# Patient Record
Sex: Male | Born: 1940 | Race: White | Hispanic: No | Marital: Married | Smoking: Never smoker
Health system: Southern US, Community
[De-identification: ages and names within clinical notes are randomized; demographics above are authoritative.]

## PROBLEM LIST (undated history)

## (undated) DIAGNOSIS — I712 Thoracic aortic aneurysm, without rupture: Secondary | ICD-10-CM

## (undated) DIAGNOSIS — I359 Nonrheumatic aortic valve disorder, unspecified: Secondary | ICD-10-CM

## (undated) DIAGNOSIS — H547 Unspecified visual loss: Secondary | ICD-10-CM

## (undated) DIAGNOSIS — K635 Polyp of colon: Secondary | ICD-10-CM

## (undated) DIAGNOSIS — I10 Essential (primary) hypertension: Secondary | ICD-10-CM

## (undated) DIAGNOSIS — R011 Cardiac murmur, unspecified: Secondary | ICD-10-CM

## (undated) DIAGNOSIS — Z954 Presence of other heart-valve replacement: Secondary | ICD-10-CM

## (undated) DIAGNOSIS — M1990 Unspecified osteoarthritis, unspecified site: Secondary | ICD-10-CM

## (undated) DIAGNOSIS — E785 Hyperlipidemia, unspecified: Secondary | ICD-10-CM

## (undated) HISTORY — DX: Unspecified osteoarthritis, unspecified site: M19.90

## (undated) HISTORY — DX: Hyperlipidemia, unspecified: E78.5

## (undated) HISTORY — DX: Essential (primary) hypertension: I10

## (undated) HISTORY — DX: Thoracic aortic aneurysm, without rupture: I71.2

## (undated) HISTORY — PX: TONSILLECTOMY: SHX5217

## (undated) HISTORY — PX: CARDIAC CATHETERIZATION: SHX172

## (undated) HISTORY — DX: Presence of other heart-valve replacement: Z95.4

## (undated) HISTORY — DX: Polyp of colon: K63.5

---
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
## 1974-04-30 HISTORY — PX: OTHER SURGICAL HISTORY: SHX169

## 1980-04-30 DIAGNOSIS — H547 Unspecified visual loss: Secondary | ICD-10-CM

## 1980-04-30 HISTORY — DX: Unspecified visual loss: H54.7

## 1980-04-30 HISTORY — PX: OTHER SURGICAL HISTORY: SHX169

## 2003-04-01 ENCOUNTER — Encounter: Payer: Self-pay | Admitting: Internal Medicine

## 2003-05-17 ENCOUNTER — Encounter: Payer: Self-pay | Admitting: Internal Medicine

## 2006-01-22 ENCOUNTER — Encounter: Admission: RE | Admit: 2006-01-22 | Discharge: 2006-01-22 | Payer: Self-pay | Admitting: Orthopedic Surgery

## 2009-03-03 ENCOUNTER — Encounter (INDEPENDENT_AMBULATORY_CARE_PROVIDER_SITE_OTHER): Payer: Self-pay | Admitting: *Deleted

## 2009-04-21 DIAGNOSIS — K644 Residual hemorrhoidal skin tags: Secondary | ICD-10-CM | POA: Insufficient documentation

## 2009-04-21 DIAGNOSIS — I10 Essential (primary) hypertension: Secondary | ICD-10-CM

## 2009-04-21 DIAGNOSIS — M109 Gout, unspecified: Secondary | ICD-10-CM | POA: Insufficient documentation

## 2009-04-21 DIAGNOSIS — Q230 Congenital stenosis of aortic valve: Secondary | ICD-10-CM

## 2009-04-21 DIAGNOSIS — E785 Hyperlipidemia, unspecified: Secondary | ICD-10-CM

## 2009-05-02 ENCOUNTER — Ambulatory Visit: Payer: Self-pay | Admitting: Internal Medicine

## 2009-06-03 ENCOUNTER — Telehealth: Payer: Self-pay | Admitting: Internal Medicine

## 2009-06-07 ENCOUNTER — Ambulatory Visit: Payer: Self-pay | Admitting: Internal Medicine

## 2009-06-08 ENCOUNTER — Telehealth: Payer: Self-pay | Admitting: Internal Medicine

## 2009-06-08 ENCOUNTER — Encounter: Payer: Self-pay | Admitting: Internal Medicine

## 2010-05-30 NOTE — Progress Notes (Signed)
Summary: ? re col report  Phone Note Call from Patient Call back at [phone]   Caller: Patient Call For: Juanda Chance Summary of Call: Patient has questions regarding report given to him after procedure yesterday Initial call taken by: Tawni Levy,  June 08, 2009 4:47 PM  Follow-up for Phone Call        Pt questioned what a sessile polyp is; all questions answered and pt told he would receive a letter from Dr. Juanda Chance as to what pathology showed.  No further questions Follow-up by: Karl Bales RN,  June 08, 2009 4:55 PM

## 2010-05-30 NOTE — Letter (Signed)
Summary: Patient Notice- Polyp Results  [facility]  [address] [place], [place] [address]   Phone: [phone]  Fax: [phone]        June 08, 2009 MRN: [id number]    [facility] [address] [place], [place]  [address]    Dear Mr. [facility],  I am pleased to inform you that the colon polyp(s) removed during your recent colonoscopy was (were) found to be benign (no cancer detected) upon pathologic examination.The polyp was an adenoma ( precancerous polyp)  I recommend you have a repeat colonoscopy examination in 5_ years to look for recurrent polyps, as having colon polyps increases your risk for having recurrent polyps or even colon cancer in the future.  Should you develop new or worsening symptoms of abdominal pain, bowel habit changes or bleeding from the rectum or bowels, please schedule an evaluation with either your primary care physician or with me.  Additional information/recommendations:  _x_ No further action with gastroenterology is needed at this time. Please      follow-up with your primary care physician for your other healthcare      needs.  __ Please call [phone] to schedule a return visit to review your      situation.  __ Please keep your follow-up visit as already scheduled.  __ Continue treatment plan as outlined the day of your exam.  Please call us if you are having persistent problems or have questions about your condition that have not been fully answered at this time.  Sincerely,  Hart Carwin MD  This letter has been electronically signed by your physician.  Appended Document: Patient Notice- Polyp Results letter mailed 2.11.11

## 2010-05-30 NOTE — Procedures (Signed)
Summary: Colonoscopy  Patient: [name] Note: All result statuses are Final unless otherwise noted.  Tests: (1) Colonoscopy (COL)   COL Colonoscopy           DONE     [facility]     [address]     [place], [place]  [address]           COLONOSCOPY PROCEDURE REPORT           PATIENT:  Dakota Martin, Dakota Martin  MR#:  [id number]     BIRTHDATE:  07-28-1940, 68 yrs. old  GENDER:  male           ENDOSCOPIST:  Hedwig Morton. Juanda Chance, MD     Referred by:  Rodrigo Ran, M.D.           PROCEDURE DATE:  06/07/2009     PROCEDURE:  Colonoscopy 63[address]     ASA CLASS:  Class II     INDICATIONS:  colon cancer in father     hx of colon polyp     pt on Coumadin           MEDICATIONS:   Versed 8 mg, Fentanyl 50 mcg           DESCRIPTION OF PROCEDURE:   After the risks benefits and     alternatives of the procedure were thoroughly explained, informed     consent was obtained.  Digital rectal exam was performed and     revealed no rectal masses.   The LB CF-[id number] endoscope     was introduced through the anus and advanced to the cecum, which     was identified by both the appendix and ileocecal valve, without     limitations.  The quality of the prep was good, using MiraLax.     The instrument was then slowly withdrawn as the colon was fully     examined.     <<PROCEDUREIMAGES>>           FINDINGS:  A sessile polyp was found. 3 mm sessile polyp at 25 cm     removed with hot biopsies piecemeal With hot biopsy forceps,     biopsy was obtained and sent to pathology (see image3 and image4).     Internal hemorrhoids were found (see image5 and image6).  This was     otherwise a normal examination of the colon (see image1 and     image2).   Retroflexed views in the rectum revealed no     abnormalities.    The scope was then withdrawn from the patient     and the procedure completed.           COMPLICATIONS:  None           ENDOSCOPIC IMPRESSION:     1) Sessile polyp     2) Internal  hemorrhoids     3) Otherwise normal examination     s/p polypectomy     RECOMMENDATIONS:     1) Await pathology results     continue Coumadin           REPEAT EXAM:  In 5 year(s) for.           ______________________________     Hedwig Morton. Juanda Chance, MD           CC:           n.     eSIGNED:   Hedwig Morton. Jmarion Christiano at 06/07/2009 08:37 AM  [name], [name], [id number]  Note: An exclamation mark (!) indicates a result that was not dispersed into the flowsheet. Document Creation Date: 06/07/2009 8:37 AM _______________________________________________________________________  (1) Order result status: Final Collection or observation date-time: 06/07/2009 08:29 Requested date-time:  Receipt date-time:  Reported date-time:  Referring Physician:   Ordering Physician: Lina Sar [phone]) Specimen Source:  Source: Launa Grill Order Number: [id number] Lab site:   Appended Document: Colonoscopy     Procedures Next Due Date:    Colonoscopy: 05/2014

## 2010-05-30 NOTE — Progress Notes (Signed)
Summary: ? re procedure  Phone Note Call from Patient Call back at [phone]   Caller: Patient Call For: Juanda Chance Reason for Call: Talk to Nurse Summary of Call: Patient has questions regarding procedure before col on Tues. Initial call taken by: Tawni Levy,  June 03, 2009 11:19 AM  Follow-up for Phone Call        Patient's questions have  been answered. Follow-up by: Hortense Ramal CMA Duncan Dull),  June 03, 2009 11:35 AM

## 2010-05-30 NOTE — Letter (Signed)
Summary: [facility] Instructions  [facility]  [address] [place], [place] [address]   Phone: [phone]  Fax: [phone]       Dakota Martin    [birth year]    MRN: [id number]       Procedure Day /Date: 06/07/09 (Tuesday)     Arrival Time: 7:30 am     Procedure Time: 8:00 am     Location of Procedure:                    _ x_  [facility] (4th Floor)  PREPARATION FOR COLONOSCOPY WITH MIRALAX  Starting 5 days prior to your procedure 05/31/09 do not eat nuts, seeds, popcorn, corn, beans, peas,  salads, or any raw vegetables.  Do not take any fiber supplements (e.g. Metamucil, Citrucel, and Benefiber). ____________________________________________________________________________________________________   THE DAY BEFORE YOUR PROCEDURE         DATE: 06/06/09 DAY: Monday 1   Drink clear liquids the entire day-NO SOLID FOOD  2   Do not drink anything colored red or purple.  Avoid juices with pulp.  No orange juice.  3   Drink at least 64 oz. (8 glasses) of fluid/clear liquids during the day to prevent dehydration and help the prep work efficiently.  CLEAR LIQUIDS INCLUDE: Water Jello Ice Popsicles Tea (sugar ok, no milk/cream) Powdered fruit flavored drinks Coffee (sugar ok, no milk/cream) Gatorade Juice: apple, white grape, white cranberry  Lemonade Clear bullion, consomm, broth Carbonated beverages (any kind) Strained chicken noodle soup Hard Candy  4   Mix the entire bottle of Miralax with 64 oz. of Gatorade/Powerade in the morning and put in the refrigerator to chill.  5   At 3:00 pm take 2 Dulcolax/Bisacodyl tablets.  6   At 4:30 pm take one Reglan/Metoclopramide tablet.  7  Starting at 5:00 pm drink one 8 oz glass of the Miralax mixture every 15-20 minutes until you have finished drinking the entire 64 oz.  You should finish drinking prep around 7:30 or 8:00 pm.  8   If you are nauseated, you may take the 2nd Reglan/Metoclopramide tablet at  6:30 pm.        9    At 8:00 pm take 2 more DULCOLAX/Bisacodyl tablets.         THE DAY OF YOUR PROCEDURE      DATE:  06/07/09 DAY: Tuesday  You may drink clear liquids until 6:00 am  (2 HOURS BEFORE PROCEDURE).   MEDICATION INSTRUCTIONS  Unless otherwise instructed, you should take regular prescription medications with a small sip of water as early as possible the morning of your procedure.          Continue taking Coumadin per Dr Lina Sar         OTHER INSTRUCTIONS  You will need a responsible adult at least 69 years of age to accompany you and drive you home.   This person must remain in the waiting room during your procedure.  Wear loose fitting clothing that is easily removed.  Leave jewelry and other valuables at home.  However, you may wish to bring a book to read or an iPod/MP3 player to listen to music as you wait for your procedure to start.  Remove all body piercing jewelry and leave at home.  Total time from sign-in until discharge is approximately 2-3 hours.  You should go home directly after your procedure and rest.  You can resume normal activities the day after  your procedure.  The day of your procedure you should not:   Drive   Make legal decisions   Operate machinery   Drink alcohol   Return to work  You will receive specific instructions about eating, activities and medications before you leave.   The above instructions have been reviewed and explained to me by   Hortense Ramal, CMA    I fully understand and can verbalize these instructions _____________________________ Date 05/02/09

## 2010-05-30 NOTE — Assessment & Plan Note (Signed)
Summary: REC COL/SCRNING/COUMADIN...AS.   History of Present Illness Visit Type: Initial Visit Primary GI MD: Lina Sar MD Primary Provider: Rodrigo Ran, MD Chief Complaint: Patient here for consult for colonoscopy due to being on coumadin.  History of Present Illness:   This is a 70 year old white male who is here to discuss having a recall colonoscopy. He has a family history of colon cancer in his mother as well as a history of colon polyps in his brother. His last colonoscopy in January 2005 was negative. 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 He prefers to have his colonoscopy done again on Coumadin. Patient denies any lower GI symptoms, rectal bleeding or abdominal pain.   GI Review of Systems      Denies abdominal pain, acid reflux, belching, bloating, chest pain, dysphagia with liquids, dysphagia with solids, heartburn, loss of appetite, nausea, vomiting, vomiting blood, weight loss, and  weight gain.        Denies anal fissure, black tarry stools, change in bowel habit, constipation, diarrhea, diverticulosis, fecal incontinence, heme positive stool, hemorrhoids, irritable bowel syndrome, jaundice, light color stool, liver problems, rectal bleeding, and  rectal pain.    Current Medications (verified): 1)  Coumadin 5 Mg Tabs (Warfarin Sodium) .... Take One A Day By Mouth, Except On Mon, Wed, Fri, and Sat 2)  Lipitor 10 Mg Tabs (Atorvastatin Calcium) .... Take One By Mouth Once Daily 3)  Zetia 10 Mg Tabs (Ezetimibe) .... Take One By Mouth Once Daily 4)  Benicar 40 Mg Tabs (Olmesartan Medoxomil) .... Take One By Mouth Once Daily 5)  Amlodipine Besylate 5 Mg Tabs (Amlodipine Besylate) .... Take One By Mouth Once Daily 6)  Allopurinol 300 Mg Tabs (Allopurinol) .... Take One By Mouth Once Daily 7)  Colchicine 0.6 Mg Tabs (Colchicine) .... 2-3 Per  Day As Needed For Gout Attacks. 8)  Flonase 50 Mcg/act Susp (Fluticasone Propionate) .... One Spray, One Nostril As Needed 9)  Multivitamins  Tabs (Multiple Vitamin) .... Take Four  By Mouth Once Daily 10)  Fish Oil 300 Mg Caps (Omega-3 Fatty Acids) .... Take Two By Mouth Once Daily 11)  Coenzyme Q10 50 Mg Caps (Coenzyme Q10) .... Take One By Mouth Once Daily 12)  Glucosamine Sulfate 750 Mg Caps (Glucosamine Sulfate) .... Take Two By Mouth Once Daily 13)  Kls Allerclear D-24hr 10-240 Mg Xr24h-Tab (Loratadine-Pseudoephedrine) .... Take One By Mouth Once Daily As Needed 14)  Vitamin D 1000 Unit Tabs (Cholecalciferol) .... Take One By Mouth Once Daily  Allergies (verified): No Known Drug Allergies  Past History:  Past Medical History: Reviewed history from 04/21/2009 and no changes required. Current Problems:  Family Hx of COLON CANCER (ICD-153.9) AORTIC STENOSIS, CONGENITAL (ICD-746.3) HYPERTENSION (ICD-401.9) HYPERLIPIDEMIA (ICD-272.4) GOUT, UNSPECIFIED (ICD-274.9) EXTERNAL HEMORRHOIDS (ICD-455.3)    Past Surgical History: Aortic Valve Replacement-Bjork-Shiley valve T & A Vasectomy  Family History: Family History of Colon Cancer: Mother-diagnosed in her 22's Family History of Colon Polyps: Brother Family History of Diabetes: maternal grandfather  Social History: Alcohol Use - yes 2 per day Illicit Drug Use - no Daily Caffeine Use 3 per day Occupation: retired married with two girls  Review of Systems       The patient complains of allergy/sinus.  The patient denies anemia, anxiety-new, arthritis/joint pain, back pain, blood in urine, breast changes/lumps, change in vision, confusion, cough, coughing up blood, depression-new,  fainting, fatigue, fever, headaches-new, hearing problems, heart murmur, heart rhythm changes, itching, menstrual pain, muscle pains/cramps, night sweats, nosebleeds, pregnancy symptoms, shortness of breath, skin rash, sleeping problems, sore throat,  swelling of feet/legs, swollen lymph glands, thirst - excessive , urination - excessive , urination changes/pain, urine leakage, vision changes, and voice change.         Pertinent positive and negative review of systems were noted in the above HPI. All other ROS was otherwise negative.   Vital Signs:  Patient profile:   70 year old male Height:      70 inches Weight:      181.8 pounds BMI:     26.18 Pulse rate:   88 / minute Pulse rhythm:   regular BP sitting:   144 / 60  (left arm) Cuff size:   regular  Vitals Entered By: Harlow Mares CMA Duncan Dull) (May 02, 2009 3:13 PM)  Physical Exam  General:  Well developed, well nourished, no acute distress. Eyes:  PERRLA, no icterus. Neck:  Supple; no masses or thyromegaly. Lungs:  Clear throughout to auscultation. Heart:  soft 1/6 systolic ejection murmur barely audible in carotids, regular rhythm, precordium quiet. Abdomen:  Soft, nontender and nondistended. No masses, hepatosplenomegaly or hernias noted. Normal bowel sounds. Extremities:  No clubbing, cyanosis, edema or deformities noted.   Impression & Recommendations:  Problem # 1:  Family Hx of COLON CANCER (ICD-153.9)  Patient is due for a colonoscopy. His last one was in January 2005. He is currently asymptomatic. We will schedule him with a MiraLax prep on Coumadin.  Orders: Colonoscopy (Colon)  Problem # 2:  AORTIC STENOSIS, CONGENITAL (ICD-746.3) Patient takes prophylaxis for dental procedures but according to the new AGA guidelines, no antibiotics are necessary before his colonoscopy.  Problem # 3:  EXTERNAL HEMORRHOIDS (ICD-455.3)  asymptomatic.  Orders: Colonoscopy (Colon)  Patient Instructions: 1)  schedule colonoscopy with MiraLax prep. 2)  High-fiber diet. 3)  Continue Coumadin throughout the procedure. 4)  No SBE prophylaxis according to AGA guidelines. 5)  Copy sent to :Dr Rodrigo Ran Prescriptions: DULCOLAX 5 MG  TBEC (BISACODYL) Day before procedure  take 2 at 3pm and 2 at 8pm.  #4 x 0   Entered by:   Hortense Ramal CMA (AAMA)   Authorized by:   Hart Carwin MD   Signed by:   Hortense Ramal CMA (AAMA) on 05/02/2009   Method used:   Electronically to        [id number] (retail)       [address]       [place], [place]  [address]       Ph: [phone] or [phone]       Fax: [phone]   RxID:   [id number] REGLAN 10 MG  TABS (METOCLOPRAMIDE HCL) As per prep instructions.  #2 x 0   Entered by:   Hortense Ramal CMA (AAMA)   Authorized by:   Hart Carwin MD   Signed by:   Hortense Ramal CMA (AAMA) on 05/02/2009   Method used:   Electronically to        [id number] (retail)       [address]       [place], [place]  [address]       Ph: [phone] or [phone]       Fax: [phone]   RxID:   [id number] MIRALAX   POWD (POLYETHYLENE GLYCOL 3350) As per prep  instructions.  #255gm x 0  Entered by:   Hortense Ramal CMA (AAMA)   Authorized by:   Hart Carwin MD   Signed by:   Hortense Ramal CMA (AAMA) on 05/02/2009   Method used:   Electronically to        [id number] (retail)       [address]       [place], [place]  [address]       Ph: [phone] or [phone]       Fax: [phone]   RxID:   [id number]

## 2010-06-27 ENCOUNTER — Ambulatory Visit (INDEPENDENT_AMBULATORY_CARE_PROVIDER_SITE_OTHER): Payer: Medicare Other | Admitting: Cardiovascular Disease

## 2010-06-27 DIAGNOSIS — I359 Nonrheumatic aortic valve disorder, unspecified: Secondary | ICD-10-CM

## 2012-01-08 ENCOUNTER — Encounter: Payer: Self-pay | Admitting: Cardiovascular Disease

## 2013-01-15 ENCOUNTER — Other Ambulatory Visit: Payer: Self-pay | Admitting: Dermatology

## 2013-07-23 ENCOUNTER — Encounter: Payer: Self-pay | Admitting: Sports Medicine

## 2013-07-23 ENCOUNTER — Ambulatory Visit (INDEPENDENT_AMBULATORY_CARE_PROVIDER_SITE_OTHER): Payer: Medicare Other | Admitting: Sports Medicine

## 2013-07-23 ENCOUNTER — Ambulatory Visit
Admission: RE | Admit: 2013-07-23 | Discharge: 2013-07-23 | Disposition: A | Discharge status: Home / Self Care | Payer: Medicare Other | Source: Ambulatory Visit | Attending: Sports Medicine | Admitting: Sports Medicine

## 2013-07-23 VITALS — BP 138/70 | Ht 69.0 in | Wt 174.0 lb

## 2013-07-23 DIAGNOSIS — M25511 Pain in right shoulder: Secondary | ICD-10-CM

## 2013-07-23 DIAGNOSIS — M25519 Pain in unspecified shoulder: Secondary | ICD-10-CM

## 2013-07-23 DIAGNOSIS — M19019 Primary osteoarthritis, unspecified shoulder: Secondary | ICD-10-CM

## 2013-07-23 NOTE — Progress Notes (Signed)
   Subjective:    Patient ID: Dakota Martin, male    DOB: [birth year], 73 y.o.   MRN: [id number]  HPI chief complaint: Right shoulder pain and weakness  Very pleasant right-hand-dominant 73 year old male comes in today complaining of 2 weeks of right shoulder pain and weakness. He has had trouble with this same shoulder in the past. 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 Rotator cuff tendon was intact. Symptoms eventually improved. He is very active particularly with tennis. He began to notice some discomfort while playing tennis a couple of weeks ago. His pain is diffuse in the shoulder. It is present with abduction and external rotation as well as internal rotation. He has been working with a local physical therapist and at his appointment yesterday concern was raised about weakness with external rotation of this shoulder. His therapist recommended an appointment with me. No prior shoulder surgeries. No associated numbness or tingling. No neck pain. Pain does occasionally awaken him at night. He takes ibuprofen on a when necessary basis but is limited with the amount of NSAIDs he can take due to the fact that he is on Coumadin. He denies any locking or catching in the shoulder but does get occasional popping.  Current medications include warfarin(heart valve replacement), Lipitor, Zetia, allopurinol, colchicine, amlodipine, Benicar, and multivitamins No known drug allergies    Review of Systems As above     Objective:   Physical Exam Well-developed, well-nourished. No acute distress. Awake alert and oriented x3. Vital signs are reviewed.  Right shoulder: Active forward flexion is to about 140. Active abduction is to 100. Internal rotation is 80. External rotation is 70. No tenderness to palpation along the clavicle nor over the a.c. joint. No tenderness over the bicipital  groove. No atrophy. Negative empty can, negative Hawkins. Patient has excellent strength with resisted supraspinatus but has 3-4/5 strength with resisted external rotation along with pain. There is palpable crepitus with passive internal and external rotation. Negative Spurling's. Neurovascularly intact distally.  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 Patient will followup with me early next week to review those x-rays. I will likely do an ultrasound of his right shoulder at followup. However, even if he has a rotator cuff tear, the bigger problem here is the glenohumeral DJD. We will discuss treatment including cortisone injections at his followup visit as well. He will hold on physical therapy until he sees me again next week.  Addendum: I personally reviewed the x-rays of his right shoulder. 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 I will discuss these results with the patient at followup.

## 2013-07-28 ENCOUNTER — Encounter: Payer: Self-pay | Admitting: Sports Medicine

## 2013-07-28 ENCOUNTER — Encounter (INDEPENDENT_AMBULATORY_CARE_PROVIDER_SITE_OTHER): Payer: Self-pay

## 2013-07-28 ENCOUNTER — Ambulatory Visit (INDEPENDENT_AMBULATORY_CARE_PROVIDER_SITE_OTHER): Payer: Medicare Other | Admitting: Sports Medicine

## 2013-07-28 VITALS — BP 167/82 | HR 76 | Ht 69.0 in | Wt 174.0 lb

## 2013-07-28 DIAGNOSIS — M19019 Primary osteoarthritis, unspecified shoulder: Secondary | ICD-10-CM | POA: Insufficient documentation

## 2013-07-28 NOTE — Progress Notes (Signed)
   Subjective:    Patient ID: Dakota Martin, male    DOB: Jun 06, 1940, 73 y.o.   MRN: [id number]  HPI Patient comes in today to discuss x-ray findings of his right shoulder. 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 He continues to have diffuse pain in the right shoulder but also noticeable weakness with resisted external rotation. No numbness. He was working with physical therapy up until recently. I asked him to put therapy on hold until I saw him today.    Review of Systems     Objective:   Physical Exam Well-developed, well-nourished. No acute distress  Right shoulder: No noticeable atrophy. Limited range of motion in all planes secondary to pain. 2-3/5 strength with resisted external rotation. Supraspinatus strength is 4/5 and reproducible of pain. No tenderness in the bicipital groove. Neurovascularly intact distally.  [facility] ultrasound of the right shoulder: Limited images of the right shoulder were obtained. There are hypoechoic changes in the distal supraspinatus tendon on the articular side. These changes are seen both in the long and short view and are consistent with probable partial supraspinatus tearing. Subscapularis had a few scattered hypoechoic changes but no discrete tear. Infraspinatus tendon appears to be within normal limits.  X-rays are as above       Assessment & Plan:  1. 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 Right shoulder weakness, especially with external rotation  Although the patient does have ultrasound findings consistent with a partial supraspinatus tendon tear, the bigger problem is the underlying bone-on-bone glenohumeral DJD. I'm not convinced that either one of these however explains his weakness with external rotation. He has pure weakness and not weakness secondary to pain. Weakness has been present for about 3 weeks. It is possible that he could have a  paralabral cyst in either the suprascapular notch or spinoglenoid notch. I discussed the patient's treatment options including cortisone injection, resuming physical therapy, or referral to one of the shoulder specialists. Patient has had cortisone injections in the past without much improvement. Therefore, I will have him resume physical therapy and followup with me in 3 weeks. If weakness persists then I may encourage him to see one of the shoulder specialists for their input. I've encouraged him to call me with questions or concerns in the interim.

## 2013-08-17 ENCOUNTER — Ambulatory Visit: Payer: Medicare Other | Admitting: Sports Medicine

## 2013-08-19 ENCOUNTER — Ambulatory Visit: Payer: Medicare Other | Admitting: Sports Medicine

## 2013-09-16 ENCOUNTER — Ambulatory Visit (INDEPENDENT_AMBULATORY_CARE_PROVIDER_SITE_OTHER): Payer: Medicare Other | Admitting: Sports Medicine

## 2013-09-16 ENCOUNTER — Encounter: Payer: Self-pay | Admitting: Sports Medicine

## 2013-09-16 VITALS — BP 127/75 | Ht 69.0 in | Wt 170.0 lb

## 2013-09-16 DIAGNOSIS — M19019 Primary osteoarthritis, unspecified shoulder: Secondary | ICD-10-CM

## 2013-09-16 NOTE — Progress Notes (Signed)
   Subjective:    Patient ID: Dakota Martin, male    DOB: 07-19-1940, 73 y.o.   MRN: [id number]  HPI Patient comes in today for followup on right shoulder weakness. Overall his pain and weakness had improved. He continues to work with Kym Groom. He is going every other week but he is doing his home exercises daily. As a result, he has noticed improved strength with external rotation of the shoulder. Still with some occasional pain but he has some underlying osteoarthritis. Overall, he is pleased with his progress to date.    Review of Systems     Objective:   Physical Exam Well-developed, well-nourished. No acute distress. Awake alert and oriented x3. Vital signs reviewed.  Right shoulder: There may be a mild amount of atrophy in the supraspinatus and infraspinatus fossas. He has a fairly good range of motion today. It is fairly pain-free range of motion. 4/5 strength with resisted external rotation which is a great improvement from his office visit on March 31. Still with excellent strength with resisted internal rotation and supraspinatus. No pain today with resisted supraspinatus. No pain with empty can testing. He remains neurovascular intact distally.       Assessment & Plan:  1. Improved shoulder pain secondary to glenohumeral DJD concomitant partial supraspinatus tear (seen on ultrasound at last visit) 2. Improved right shoulder weakness (external rotation)  My main concern initially was the patient's weakness with external rotation. That has improved quite a bit over the past several weeks. His pain is tolerable and I think it is a result of his end-stage glenohumeral DJD and partial thickness rotator cuff tear seen on ultrasound. Since he is improving I will allow him to continue to work with physical therapy. If pain worsens in the future we could consider cortisone injection. I've explained to the patient that if he starts to experience worsening weakening again then  he needs to notify me and I would consider an MRI of his shoulder to rule out a para labral cyst in the suprascapular or sphenoglenoid notches. He understands. Followup when necessary.

## 2013-12-08 ENCOUNTER — Other Ambulatory Visit (HOSPITAL_COMMUNITY): Payer: Medicare Other

## 2014-01-07 ENCOUNTER — Ambulatory Visit (INDEPENDENT_AMBULATORY_CARE_PROVIDER_SITE_OTHER): Payer: Medicare Other | Admitting: Cardiovascular Disease

## 2014-01-07 ENCOUNTER — Encounter: Payer: Self-pay | Admitting: Cardiovascular Disease

## 2014-01-07 VITALS — BP 126/66 | HR 84 | Ht 69.0 in | Wt 178.0 lb

## 2014-01-07 DIAGNOSIS — I10 Essential (primary) hypertension: Secondary | ICD-10-CM

## 2014-01-07 DIAGNOSIS — Z954 Presence of other heart-valve replacement: Secondary | ICD-10-CM

## 2014-01-07 DIAGNOSIS — Z952 Presence of prosthetic heart valve: Secondary | ICD-10-CM

## 2014-01-07 NOTE — Assessment & Plan Note (Signed)
Dakota Martin is status post Aortic valve replacement. He had a Bjork Shiley  mechanical aortic valve placed in 1982 by Dr. Truman Hayward. He's been doing fairly well. His Coumadin levels have been followed by Scientist, product/process development. His INR levels have been therapeutic.  Continue same medications. We'll see him back in the office in one year.

## 2014-01-07 NOTE — Patient Instructions (Signed)
Your physician recommends that you continue on your current medications as directed. Please refer to the Current Medication list given to you today.  Your physician has requested that you have an echocardiogram. Echocardiography is a painless test that uses sound waves to create images of your heart. It provides your doctor with information about the size and shape of your heart and how well your heart's chambers and valves are working. This procedure takes approximately one hour. There are no restrictions for this procedure.  Your physician wants you to follow-up in: 1 year with Dr. Nahser.  You will receive a reminder letter in the mail two months in advance. If you don't receive a letter, please call our office to schedule the follow-up appointment.  

## 2014-01-07 NOTE — Progress Notes (Signed)
Dakota Martin Date of Birth  1940-06-24       [facility]    [employer] [address] [place] [address], [place], [place]  [address]   [place], [place]  [address] [place]   Fax  [phone]     Fax [phone]  Problem List: 1. Aortic valve replacement ( June 1982, Onnie Boer, MD)  [facility] mechanical valve. 2. Chronic coumadin therapy.  3. Hypertension 4. Hyperlipidemia   History of Present Illness:  Dakota Martin is a 73 yo with hx of AVR in 1982.   He has been doing well.  No CP , no dyspnea.   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    Current Outpatient Prescriptions on File Prior to Visit  Medication Sig Dispense Refill  . allopurinol (ZYLOPRIM) 300 MG tablet Take 300 mg by mouth daily.      [name] Kitchen amLODipine (NORVASC) 5 MG tablet Take 5 mg by mouth daily.      [name] Kitchen amoxicillin (AMOXIL) 500 MG capsule Take 500 mg by mouth as needed. For dental visits      . atorvastatin (LIPITOR) 10 MG tablet Take 10 mg by mouth daily.      . Cholecalciferol (VITAMIN D-3 PO) Take by mouth.      . Coenzyme Q10 (CO Q 10 PO) Take by mouth.      . ezetimibe (ZETIA) 10 MG tablet Take 10 mg by mouth daily.      . fish oil-omega-3 fatty acids 1000 MG capsule Take 2 g by mouth daily.      . Fluticasone Propionate (FLONASE NA) Place into the nose.      [name] Kitchen glucosamine-chondroitin 500-400 MG tablet Take 1 tablet by mouth 3 (three) times daily.      [name] Kitchen warfarin (COUMADIN) 5 MG tablet Take 5 mg by mouth daily.       No current facility-administered medications on file prior to visit.    No Known Allergies  Past Medical History  Diagnosis Date  . Hypertension   . Hyperlipidemia   . Gout     Past Surgical History  Procedure Laterality Date  . Aortic valve  1982    prosthetic  val ve  . Tonsillectomy      History  Smoking status  . Never Smoker     Smokeless tobacco  . Not on file    History  Alcohol Use: Not on file    No family history on file.  Reviw of Systems:  Reviewed in the HPI.  All other systems are negative.  Physical Exam: Blood pressure 126/66, pulse 84, height 5\' 9"  (1.753 m), weight 178 lb (80.74 kg). Wt Readings from Last 3 Encounters:  01/07/14 178 lb (80.74 kg)  09/16/13 170 lb (77.111 kg)  07/28/13 174 lb (78.926 kg)     General: Well developed, well nourished, in no acute distress.  Head: Normocephalic, atraumatic, sclera non-icteric, mucus membranes are moist,   Neck: Supple. Carotids are 2 + without bruits. No JVD   Lungs: Clear   Heart: RR, normal S1, mechanical S2  Abdomen: Soft, non-tender, non-distended with normal bowel sounds.  Msk:  Strength and tone are normal   Extremities: No clubbing or cyanosis. No edema.  Distal pedal pulses are 2+ and equal    Neuro: CN II -  XII intact.  Alert and oriented X 3.   Psych:  Normal   ECG: 01/07/2014: Normal sinus rhythm at 84. There are no ST or T wave changes. EKG is normal.  Assessment / Plan:

## 2014-01-13 ENCOUNTER — Ambulatory Visit (HOSPITAL_COMMUNITY): Payer: Medicare Other | Attending: Cardiovascular Disease | Admitting: Cardiology

## 2014-01-13 DIAGNOSIS — E785 Hyperlipidemia, unspecified: Secondary | ICD-10-CM | POA: Diagnosis not present

## 2014-01-13 DIAGNOSIS — Z952 Presence of prosthetic heart valve: Secondary | ICD-10-CM

## 2014-01-13 DIAGNOSIS — I10 Essential (primary) hypertension: Secondary | ICD-10-CM | POA: Insufficient documentation

## 2014-01-13 DIAGNOSIS — Q230 Congenital stenosis of aortic valve: Secondary | ICD-10-CM

## 2014-01-13 DIAGNOSIS — Z954 Presence of other heart-valve replacement: Secondary | ICD-10-CM | POA: Diagnosis present

## 2014-01-13 DIAGNOSIS — I359 Nonrheumatic aortic valve disorder, unspecified: Secondary | ICD-10-CM

## 2014-01-13 NOTE — Progress Notes (Signed)
Echo performed. 

## 2014-02-17 ENCOUNTER — Other Ambulatory Visit: Payer: Self-pay | Admitting: Dermatology

## 2014-02-23 ENCOUNTER — Encounter: Payer: Self-pay | Admitting: Cardiovascular Disease

## 2014-02-23 ENCOUNTER — Ambulatory Visit (INDEPENDENT_AMBULATORY_CARE_PROVIDER_SITE_OTHER): Payer: Medicare Other | Admitting: Cardiovascular Disease

## 2014-02-23 VITALS — BP 160/72 | HR 81 | Ht 69.0 in | Wt 178.0 lb

## 2014-02-23 DIAGNOSIS — I10 Essential (primary) hypertension: Secondary | ICD-10-CM

## 2014-02-23 DIAGNOSIS — Z954 Presence of other heart-valve replacement: Secondary | ICD-10-CM

## 2014-02-23 HISTORY — DX: Presence of other heart-valve replacement: Z95.4

## 2014-02-23 MED ORDER — CARVEDILOL 6.25 MG PO TABS: 6.2500 mg | ORAL_TABLET | Freq: Two times a day (BID) | ORAL | Status: DC

## 2014-02-23 NOTE — Assessment & Plan Note (Signed)
Dakota Martin is a 73 yo with history of [place]  aortic valve replacement in 1982 by Dr. Truman Hayward.  I saw him back 4 years ago. At that time his echocardiogram looked OK.  He was not seen until last year. We've repeated his echocardiogram last month it was found have moderate dilatation of his descending aorta. Aortic root measured 50 mm. He is aortic valve appeared to be fairly normal. He had insignificant regurgitation of the mitral and tricuspid valves.  He is Completely asymptomatic.   he plays tennis on a regular basis. I am a little concerned about his aortic dilatation. In addition, his blood pressure is a little elevated.  We'll start him on carvedilol 6.25 mg twice a day. We will continue to record his blood pressure readings. We will forward this note to Dr. Roxan Hockey for further evaluation.  At some point he may need to be followed by Dr. Roxan Hockey on a regular basis.  I will see him in  3 months for followup visit.

## 2014-02-23 NOTE — Assessment & Plan Note (Signed)
His BP is a bit elevated.  We have added coreg 6.25 bid to his med list.

## 2014-02-23 NOTE — Patient Instructions (Signed)
Your physician has recommended you make the following change in your medication:  START Carvedilol (Coreg) 6.25 mg twice daily - (break these pills in half for the first week to titrate onto the medication)  Your physician recommends that you schedule a follow-up appointment in: 3 months with Dr. Acie Fredrickson

## 2014-02-23 NOTE — Progress Notes (Signed)
Dakota Martin Date of Birth  Feb 22, 1941       [facility]    [employer] [address] [place], [place], [place]  [address]   [place], [place]  [address] [place]   Fax  [phone]     Fax [phone]  Problem List: 1. Aortic valve replacement ( June 1982, Onnie Boer, MD)  [facility] mechanical valve. 2. Chronic coumadin therapy.  3. Hypertension 4. Hyperlipidemia 5. 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   He has been doing well.  No CP , no dyspnea.   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    Oct. 27, 2015:  Dakota Martin  is seen back for a scheduled visit. He had a recent echo that revealed a dilated aortic root ( 50 cm)   Current Outpatient Prescriptions on File Prior to Visit  Medication Sig Dispense Refill  . allopurinol (ZYLOPRIM) 300 MG tablet Take 300 mg by mouth daily.      [name] Kitchen amLODipine (NORVASC) 5 MG tablet Take 5 mg by mouth daily.      [name] Kitchen amoxicillin (AMOXIL) 500 MG capsule Take 500 mg by mouth as needed. For dental visits      . atorvastatin (LIPITOR) 10 MG tablet Take 10 mg by mouth daily.      . Chlorpheniramine-Pseudoeph (ALLEREST MAXIMUM STRENGTH PO) Take by mouth daily.      . Cholecalciferol (VITAMIN D-3 PO) Take by mouth.      . Coenzyme Q10 (CO Q 10 PO) Take by mouth.      . colchicine 0.6 MG tablet Take 0.6 mg by mouth daily.      [name] Kitchen ezetimibe (ZETIA) 10 MG tablet Take 10 mg by mouth daily.      . fish oil-omega-3 fatty acids 1000 MG capsule Take 2 g by mouth daily.      . Fluticasone Propionate (FLONASE NA) Place into the nose.      [name] Kitchen glucosamine-chondroitin 500-400 MG tablet Take 1 tablet by mouth 3 (three) times daily.      . Magnesium 500 MG CAPS Take 500 mg by mouth daily.      . Multiple Vitamin (MULTIVITAMIN)  capsule Take 1 capsule by mouth daily.      [name] Kitchen OVER THE COUNTER MEDICATION AYER-TRIPHALA      . warfarin (COUMADIN) 5 MG tablet Take 5 mg by mouth daily.       No current facility-administered medications on file prior to visit.   Benicar 40 mg a day   No Known Allergies  Past Medical History  Diagnosis Date  . Hypertension   . Hyperlipidemia   . Gout     Past Surgical History  Procedure Laterality Date  . Aortic valve  1982    prosthetic  val ve  . Tonsillectomy      History  Smoking status  . Never Smoker   Smokeless tobacco  . Not on file    History  Alcohol Use: Not on file    No family history on file.  Reviw of Systems:  Reviewed in the HPI.  All other systems are negative.  Physical Exam: Blood pressure 160/72, pulse 81, height 5\' 9"  (1.753 m),  weight 178 lb (80.74 kg). Wt Readings from Last 3 Encounters:  02/23/14 178 lb (80.74 kg)  01/07/14 178 lb (80.74 kg)  09/16/13 170 lb (77.111 kg)     General: Well developed, well nourished, in no acute distress.  Head: Normocephalic, atraumatic, sclera non-icteric, mucus membranes are moist,   Neck: Supple. Carotids are 2 + without bruits. No JVD   Lungs: Clear   Heart: RR, normal S1, mechanical S2  Abdomen: Soft, non-tender, non-distended with normal bowel sounds.  Msk:  Strength and tone are normal   Extremities: No clubbing or cyanosis. No edema.  Distal pedal pulses are 2+ and equal    Neuro: CN II - XII intact.  Alert and oriented X 3.   Psych:  Normal   ECG: 01/07/2014: Normal sinus rhythm at 84. There are no ST or T wave changes. EKG is normal.  Assessment / Plan:

## 2014-02-24 ENCOUNTER — Telehealth: Payer: Self-pay | Admitting: Nurse Practitioner

## 2014-02-24 DIAGNOSIS — Z952 Presence of prosthetic heart valve: Secondary | ICD-10-CM

## 2014-02-24 DIAGNOSIS — I77819 Aortic ectasia, unspecified site: Secondary | ICD-10-CM

## 2014-02-24 NOTE — Telephone Encounter (Signed)
Spoke with patient and reviewed Dr. Elmarie Shiley recommendation following his discussion with Dr. Roxan Hockey:  Dr. Roxan Hockey agrees with a CT angio of the aorta to better visualize the precise size of his aortic dilitation. Lets set that up this week or next. I think [name] would appreciate A Consultationw with Dr. Roxan Hockey for reassurance. Thanks  I advised that he will receive a call from our scheduler to set up appointment for CT and a call from Dr. Leonarda Salon office regarding appointment. Patient aware that BMET is needed at least 1 day prior and within 14 days of CT. Patient verbalized understanding and agreement.

## 2014-02-25 ENCOUNTER — Encounter: Payer: Self-pay | Admitting: Cardiovascular Disease

## 2014-03-01 ENCOUNTER — Inpatient Hospital Stay: Admission: RE | Admit: 2014-03-01 | Payer: Medicare Other | Source: Ambulatory Visit

## 2014-03-02 ENCOUNTER — Other Ambulatory Visit (INDEPENDENT_AMBULATORY_CARE_PROVIDER_SITE_OTHER): Payer: Medicare Other

## 2014-03-02 DIAGNOSIS — Z952 Presence of prosthetic heart valve: Secondary | ICD-10-CM

## 2014-03-02 DIAGNOSIS — Z954 Presence of other heart-valve replacement: Secondary | ICD-10-CM

## 2014-03-02 DIAGNOSIS — I77819 Aortic ectasia, unspecified site: Secondary | ICD-10-CM

## 2014-03-02 LAB — BASIC METABOLIC PANEL
BUN: 20 mg/dL (ref 6–23)
CALCIUM: 9.2 mg/dL (ref 8.4–10.5)
CO2: 32 mEq/L (ref 19–32)
Chloride: 101 mEq/L (ref 96–112)
Creatinine, Ser: 1.1 mg/dL (ref 0.4–1.5)
GFR: 71.38 mL/min (ref >60.00)
Glucose, Bld: 102 mg/dL — ABNORMAL HIGH (ref 70–99)
POTASSIUM: 4.2 meq/L (ref 3.5–5.1)
SODIUM: 137 meq/L (ref 135–145)

## 2014-03-04 ENCOUNTER — Ambulatory Visit (HOSPITAL_COMMUNITY)
Admission: RE | Admit: 2014-03-04 | Discharge: 2014-03-04 | Disposition: A | Discharge status: Home / Self Care | Payer: Medicare Other | Source: Ambulatory Visit | Attending: Cardiovascular Disease | Admitting: Cardiovascular Disease

## 2014-03-04 DIAGNOSIS — Z952 Presence of prosthetic heart valve: Secondary | ICD-10-CM

## 2014-03-04 DIAGNOSIS — I77819 Aortic ectasia, unspecified site: Secondary | ICD-10-CM | POA: Diagnosis not present

## 2014-03-04 DIAGNOSIS — Z954 Presence of other heart-valve replacement: Secondary | ICD-10-CM | POA: Insufficient documentation

## 2014-03-04 MED ORDER — IOHEXOL 350 MG/ML SOLN
100.0000 mL | Freq: Once | INTRAVENOUS | Status: AC | PRN
  Administered 2014-03-04: 100 mL via INTRAVENOUS

## 2014-03-09 ENCOUNTER — Institutional Professional Consult (permissible substitution) (INDEPENDENT_AMBULATORY_CARE_PROVIDER_SITE_OTHER): Payer: Medicare Other | Admitting: Thoracic Surgery (Cardiothoracic Vascular Surgery)

## 2014-03-09 ENCOUNTER — Encounter: Payer: Self-pay | Admitting: Thoracic Surgery (Cardiothoracic Vascular Surgery)

## 2014-03-09 ENCOUNTER — Encounter: Payer: Medicare Other | Admitting: Thoracic Surgery (Cardiothoracic Vascular Surgery)

## 2014-03-09 VITALS — BP 150/81 | HR 82 | Resp 16 | Ht 69.0 in | Wt 170.0 lb

## 2014-03-09 DIAGNOSIS — I712 Thoracic aortic aneurysm, without rupture, unspecified: Secondary | ICD-10-CM

## 2014-03-09 NOTE — Progress Notes (Signed)
PCP is Jerlyn Ly, MD Referring Provider is Nahser, Wonda Cheng, MD  Chief Complaint  Patient presents with  . Thoracic Aortic Aneurysm    eval...per ECHO 01/13/14....[name] KitchenCTA  CHEST 03/04/14    HPI: 73 year old gentleman sent for consultation regarding an ascending aortic aneurysm.  Dakota Martin is a 73 year old gentleman with a history of an aortic valve replacement with a Bjork Shiley valve in 1982 by Dr. Bradly Bienenstock. He also has a history of hypertension. He is currently followed by Dr. Acie Fredrickson. He recently went for a routine follow-up visit and had an echocardiogram done. That showed the prosthetic valve to be functioning well. He did have a mean gradient of 15 and a peak gradient of 31 mmHg. He was also noted to have a 5 cm ascending aortic aneurysm.  He denies any chest pain, shortness of breath, palpitations, peripheral edema, orthopnea or paroxysmal nocturnal dyspnea.   Past Medical History  Diagnosis Date  . Hypertension   . Hyperlipidemia   . Gout     Past Surgical History  Procedure Laterality Date  . Aortic valve  1982    prosthetic  val ve  . Tonsillectomy      No family history on file.  Social History History  Substance Use Topics  . Smoking status: Never Smoker   . Smokeless tobacco: Not on file  . Alcohol Use: Not on file    Current Outpatient Prescriptions  Medication Sig Dispense Refill  . allopurinol (ZYLOPRIM) 300 MG tablet Take 300 mg by mouth daily.    [name] Kitchen amLODipine (NORVASC) 5 MG tablet Take 5 mg by mouth daily.    [name] Kitchen amoxicillin (AMOXIL) 500 MG capsule Take 500 mg by mouth as needed. For dental visits    . atorvastatin (LIPITOR) 10 MG tablet Take 10 mg by mouth daily.    . carvedilol (COREG) 6.25 MG tablet Take 1 tablet (6.25 mg total) by mouth 2 (two) times daily. 62 tablet 11  . Chlorpheniramine-Pseudoeph (ALLEREST MAXIMUM STRENGTH PO) Take by mouth daily.    . Cholecalciferol (VITAMIN D-3 PO) Take by mouth.    . Coenzyme Q10 (CO Q 10 PO) Take by  mouth.    . ezetimibe (ZETIA) 10 MG tablet Take 10 mg by mouth daily.    . fish oil-omega-3 fatty acids 1000 MG capsule Take 2 g by mouth daily.    . Fluticasone Propionate (FLONASE NA) Place into the nose.    . Magnesium 500 MG CAPS Take 500 mg by mouth daily.    [name] Kitchen olmesartan (BENICAR) 40 MG tablet Take 40 mg by mouth daily.    [name] Kitchen OVER THE COUNTER MEDICATION AYER-TRIPHALA    . warfarin (COUMADIN) 5 MG tablet Take 5 mg by mouth daily. OR AS DIRECTED     No current facility-administered medications for this visit.    No Known Allergies  Review of Systems  Constitutional: Negative for activity change.  Cardiovascular: Negative for chest pain, palpitations and leg swelling.  Gastrointestinal: Positive for constipation.  Genitourinary: Positive for frequency.  Musculoskeletal: Positive for arthralgias (both shoulders).  All other systems reviewed and are negative.   BP 150/81 mmHg  Pulse 82  Resp 16  Ht 5\' 9"  (1.753 m)  Wt 170 lb (77.111 kg)  BMI 25.09 kg/m2  SpO2 96% Physical Exam  Constitutional: He is oriented to person, place, and time. He appears well-developed and well-nourished. No distress.  HENT:  Head: Normocephalic and atraumatic.  Eyes: Conjunctivae and EOM are normal. Pupils are equal, round,  and reactive to light.  Neck: Neck supple. No thyromegaly present.  Cardiovascular: Normal rate, regular rhythm and intact distal pulses.   Murmur (2/6 systolic) heard. Good valve click  Pulmonary/Chest: Effort normal and breath sounds normal. He has no wheezes. He has no rales.  Abdominal: Soft. There is no tenderness.  Musculoskeletal: He exhibits no edema.  Lymphadenopathy:    He has no cervical adenopathy.  Neurological: He is alert and oriented to person, place, and time. No cranial nerve deficit.  No focal motor deficit  Skin: Skin is warm and dry.  Vitals reviewed.    Diagnostic Tests: ECHOCARDIOGRAM Study Conclusions  - Left ventricle: The cavity size was  normal. Wall thickness was increased in a pattern of moderate LVH. Systolic function was normal. The estimated ejection fraction was in the range of 60% to 65%. Doppler parameters are consistent with abnormal left ventricular relaxation (grade 1 diastolic dysfunction). The E/e&' ratio is between 8-15, suggesting indeterminate LV filling pressure. - Aortic valve: Poorly visualized. Reported Bjork-Shiley AVR - peak and mean gradients of 31 mmHg and 15 mmHg, respectively. Early peaking jet with sharp contour does not suggest obstruction. - Aorta: Ascending aortic diameter: 50 mm (S). - Ascending aorta: The ascending aorta was moderately dilated. - Mitral valve: Calcified annulus. - Left atrium: The atrium was normal in size. - Tricuspid valve: There was mild regurgitation. - Pulmonary arteries: PA peak pressure: 28 mm Hg (S). - Inferior vena cava: The vessel was normal in size. The respirophasic diameter changes were in the normal range (>= 50%), consistent with normal central venous pressure.  Impressions:  - LVEF 60-65%, moderate LVH, Bjork-Shiley AVR without evidence for obstruction. There is a 5 cm ascending aortic aneurysm. CT ANGIOGRAPHY CHEST WITH CONTRAST  TECHNIQUE: Multidetector CT imaging of the chest was performed using the standard protocol during bolus administration of intravenous contrast. Multiplanar CT image reconstructions and MIPs were obtained to evaluate the vascular anatomy.  CONTRAST: 151mL OMNIPAQUE IOHEXOL 350 MG/ML SOLN  COMPARISON: None.  FINDINGS: No pneumothorax or pleural effusion is noted. No significant pulmonary parenchymal abnormality is noted. Status post aortic valve replacement. Aneurysmal dilatation of ascending thoracic aorta is noted with maximum measured diameter of 5.0 cm. The distal portion of the transverse aortic arch measures 3.1 cm. Descending thoracic aorta appears normal except for atherosclerotic  calcifications. No dissection is noted. Great vessels are widely patent without significant stenosis. Pulmonary arteries appear normal. No mediastinal mass or adenopathy is noted. Mild coronary artery calcifications are noted.  Multiple rounded low densities are noted throughout hepatic parenchyma most consistent with cysts in the absence of any history of malignancy. No significant osseous abnormality is noted.  Review of the MIP images confirms the above findings.  IMPRESSION: Status post aortic valve repair.  5 cm ascending thoracic aortic aneurysm is noted. There is no evidence of dissection.  Mild dilatation of distal portion of transverse aortic arch is noted measuring 3.1 cm.   Electronically Signed  By: Sabino Dick M.D.  On: 03/04/2014 15:04  Impression: 73 year old gentleman with a prosthetic aortic valve which was replaced in 1982. He now is found to have a 5 cm ascending aortic aneurysm. CT angiogram confirmed the presence of the aneurysm and the size. There is no indication for surgery at the present time. I discussed the indications for surgery with Mr. And Mrs. Zagal. In general in the ascending aorta we would look for 5.5-6 cm before recommending surgery.  I did enter the importance of blood pressure  control. His blood pressure was elevated when he saw Dr. Acie Fredrickson recently and he was given a prescription for Coreg 6.25 mg by mouth twice a day. He has not started that medication yet. His blood pressure was elevated today at 150/81. I strongly emphasized that he needed to start taking the beta blocker. If his blood pressure gets too low we can probably stop the Norvasc, but the beta blocker is very important in terms of managing his aneurysm. After some discussion he seems to be in agreement with that recommendation.  Given that we don't have any fairly recent data, I recommended that we repeat a CT angiogram in about 6 months. We will then restudy him at 1  year and then can probably follow him annually after that.  Plan: Return in 6 months with CT angiogram of chest

## 2014-05-18 ENCOUNTER — Encounter: Payer: Self-pay | Admitting: Cardiovascular Disease

## 2014-05-18 ENCOUNTER — Ambulatory Visit (INDEPENDENT_AMBULATORY_CARE_PROVIDER_SITE_OTHER): Payer: Medicare Other | Admitting: Cardiovascular Disease

## 2014-05-18 VITALS — BP 150/62 | HR 64 | Ht 69.0 in | Wt 174.4 lb

## 2014-05-18 DIAGNOSIS — I10 Essential (primary) hypertension: Secondary | ICD-10-CM

## 2014-05-18 DIAGNOSIS — Z954 Presence of other heart-valve replacement: Secondary | ICD-10-CM

## 2014-05-18 MED ORDER — POTASSIUM CHLORIDE ER 10 MEQ PO TBCR: 10.0000 meq | EXTENDED_RELEASE_TABLET | Freq: Every day | ORAL | Status: DC

## 2014-05-18 MED ORDER — HYDROCHLOROTHIAZIDE 12.5 MG PO CAPS: 12.5000 mg | ORAL_CAPSULE | Freq: Every day | ORAL | Status: DC

## 2014-05-18 NOTE — Patient Instructions (Addendum)
Your physician has recommended you make the following change in your medication:  START HCTZ (Hydrochlorothiazide) 12.5 mg once daily in the AM START Kdur (potassium supplement) 10 meq once daily in the AM  Your physician recommends that you return for lab work in: 1 month for basic metabolic panel - you do not have to fast for this appointment  Your physician recommends that you schedule a follow-up appointment in: 3 months with Dr. Acie Fredrickson.

## 2014-05-18 NOTE — Progress Notes (Signed)
Cardiology Office Note   Date:  05/18/2014   ID:  Dakota Martin, DOB [birth year], MRN [id number]  PCP:  Jerlyn Ly, MD  Cardiologist:   Thayer Headings, MD   Chief Complaint  Patient presents with  . Follow-up    AVR, HTN      History of Present Illness: Dakota Martin is a 74 y.o. male who presents for follow-up of his essential hypertension and mild aortic root dilatation.   Expand All Collapse All     Problem List: 1. Aortic valve replacement ( June 1982, Onnie Boer, MD) [facility] mechanical valve. 2. Chronic coumadin therapy.  3. Hypertension 4. Hyperlipidemia 5. 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 He has been doing well. No CP , no dyspnea.  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   Oct. 27, 2015:  Dakota Martin is seen back for a scheduled visit. He had a recent echo that revealed a dilated aortic root ( 50 cm)     Jan. 19, 2016:  Dakota Martin  is seen back today for follow-up of his aortic valve replacement and history of hypertension. He has a dilated aortic root. Also has a history of hypertension and hyperlipidemia. He has been recording his BP daily. He has substituted generic Micardis ( Telmisartan)  for his Benicar     Past Medical History  Diagnosis Date  . Hypertension   . Hyperlipidemia   . Gout     Past Surgical History  Procedure Laterality Date  . Aortic valve  1982    prosthetic  val ve  . Tonsillectomy       Current Outpatient Prescriptions  Medication Sig Dispense Refill  . allopurinol (ZYLOPRIM) 300 MG tablet Take 300 mg by mouth daily.    [name] Kitchen amLODipine (NORVASC) 5 MG tablet Take 5 mg by mouth daily.    [name] Kitchen amoxicillin (AMOXIL) 500 MG capsule Take 500 mg by mouth as needed. For dental visits    . atorvastatin (LIPITOR) 10 MG tablet Take 10 mg by  mouth daily.    . carvedilol (COREG) 6.25 MG tablet Take 1 tablet (6.25 mg total) by mouth 2 (two) times daily. 62 tablet 11  . Cholecalciferol (VITAMIN D-3 PO) Take by mouth.    . Coenzyme Q10 (CO Q 10 PO) Take by mouth.    . ezetimibe (ZETIA) 10 MG tablet Take 10 mg by mouth daily.    . fish oil-omega-3 fatty acids 1000 MG capsule Take 2 g by mouth daily.    . Fluticasone Propionate (FLONASE NA) Place into the nose.    . Magnesium 500 MG CAPS Take 500 mg by mouth daily.    [name] Kitchen OVER THE COUNTER MEDICATION AYER-TRIPHALA    . telmisartan (MICARDIS) 80 MG tablet Take 80 mg by mouth daily.  11  . warfarin (COUMADIN) 5 MG tablet Take 5 mg by mouth daily. OR AS DIRECTED     No current facility-administered medications for this visit.    Allergies:   Review of patient's allergies indicates no known allergies.    Social History:  The patient  reports that he has never smoked. He does not have any smokeless tobacco history on file.   Family History:  The patient's family history includes Hyperlipidemia in his mother.  ROS:  Please see the history of present illness.   Otherwise, review of systems are positive for none.   All other systems are reviewed and negative.    PHYSICAL EXAM: VS:  BP 150/62 mmHg  Pulse 64  Ht 5\' 9"  (1.753 m)  Wt 174 lb 6.4 oz (79.107 kg)  BMI 25.74 kg/m2  SpO2 99% , BMI Body mass index is 25.74 kg/(m^2). GEN: Well nourished, well developed, in no acute distress HEENT: normal Neck: no JVD, carotid bruits, or masses Cardiac: RRR normal S1, mechanical S2. Respiratory:  clear to auscultation bilaterally, normal work of breathing GI: soft, nontender, nondistended, + BS MS: no deformity or atrophy Skin: warm and dry, no rash Neuro:  Strength and sensation are intact Psych: euthymic mood, full affect   EKG:  EKG is not ordered today.    Recent Labs: 03/02/2014: BUN 20; Creatinine 1.1; Potassium 4.2; Sodium 137    Lipid Panel No results found for: CHOL,  TRIG, HDL, CHOLHDL, VLDL, LDLCALC, LDLDIRECT    Wt Readings from Last 3 Encounters:  05/18/14 174 lb 6.4 oz (79.107 kg)  03/09/14 170 lb (77.111 kg)  02/23/14 178 lb (80.74 kg)      Other studies Reviewed:    ASSESSMENT AND PLAN:  1.  Essential Hypertension:  He has made some substitutions with his BP meds..  He has gradually increased his Coreg.  His systolic blood pressure remains mildly elevated. He's on max dose Micardis. We will add hydrocodone thiazide 12.5 mg a day. We will also add potassium chloride 10 mEq a day. He will have a basic metabolic profile drawn in one month. I'll see him again in 3 months for follow-up office visit and basic metabolic profile.  2. Aortic valve replacement:   Remains on coumadin.  His valve sounds normal. He does have a mildly dilated aortic root which is being followed by Dr. Roxan Hockey. He has another CT scheduled for later this year.  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 His INR levels are followed through his primary medical doctor.  4. Hyperlipidemia: Continue Lipitor. He's followed by his primary medical doctor.    Current medicines are reviewed at length with the patient today.  The patient does not have concerns regarding medicines.  The following changes have been made:  We'll add HCTZ 12.5 mg a day. We will also add potassium chloride 10 mEq a day.  Labs/ tests ordered today include:  No orders of the defined types were placed in this encounter.     Disposition:   FU with me  in 3 months. Will check BMP in 1 month.    Signed, Dakota Martin, Wonda Cheng, MD  05/18/2014 9:00 AM    [facility] [address], [place], [place]  [address] Phone: [phone]; Fax: [phone]

## 2014-06-18 ENCOUNTER — Encounter: Payer: Self-pay | Admitting: Gastroenterology

## 2014-06-22 ENCOUNTER — Other Ambulatory Visit (INDEPENDENT_AMBULATORY_CARE_PROVIDER_SITE_OTHER): Payer: Medicare Other | Admitting: *Deleted

## 2014-06-22 DIAGNOSIS — I10 Essential (primary) hypertension: Secondary | ICD-10-CM

## 2014-06-22 LAB — BASIC METABOLIC PANEL
BUN: 22 mg/dL (ref 6–23)
CALCIUM: 9.6 mg/dL (ref 8.4–10.5)
CO2: 33 mEq/L — ABNORMAL HIGH (ref 19–32)
CREATININE: 1.09 mg/dL (ref 0.40–1.50)
Chloride: 100 mEq/L (ref 96–112)
GFR: 70.56 mL/min (ref >60.00)
GLUCOSE: 107 mg/dL — AB (ref 70–99)
Potassium: 4.4 mEq/L (ref 3.5–5.1)
SODIUM: 136 meq/L (ref 135–145)

## 2014-06-30 ENCOUNTER — Ambulatory Visit (INDEPENDENT_AMBULATORY_CARE_PROVIDER_SITE_OTHER): Payer: Medicare Other | Admitting: Physician Assistant

## 2014-06-30 ENCOUNTER — Encounter: Payer: Self-pay | Admitting: Physician Assistant

## 2014-06-30 VITALS — BP 122/56 | HR 60 | Ht 69.0 in | Wt 174.4 lb

## 2014-06-30 DIAGNOSIS — Z7901 Long term (current) use of anticoagulants: Secondary | ICD-10-CM

## 2014-06-30 DIAGNOSIS — Z800 Family history of malignant neoplasm of digestive organs: Secondary | ICD-10-CM

## 2014-06-30 DIAGNOSIS — Z86010 Personal history of colonic polyps: Secondary | ICD-10-CM

## 2014-06-30 MED ORDER — MOVIPREP 100 G PO SOLR: ORAL | Status: DC

## 2014-06-30 NOTE — Patient Instructions (Signed)
You have been scheduled for a colonoscopy. Please follow written instructions given to you at your visit today.  You will remain on the Coumadin ( Warfarin) medication. Do not stop it for the procedure with Dr. Olevia Perches.    We have given you the colonoscopy prep, Moviprep. If you use inhalers (even only as needed), please bring them with you on the day of your procedure. Your physician has requested that you go to www.startemmi.com and enter the access code given to you at your visit today. This web site gives a general overview about your procedure. However, you should still follow specific instructions given to you by our office regarding your preparation for the procedure.

## 2014-06-30 NOTE — Progress Notes (Signed)
Reviewed. I would like to get his INR down to 2.0-2.5 before colonoscopy. Please check PT  3 days prior to the procedure.I don't see any PT's in his Lab reports. Does he [facility]? Dr Cathie Olden is his cardiologist.

## 2014-06-30 NOTE — Progress Notes (Signed)
Patient ID: Dakota Martin, male   DOB: [birth year], 74 y.o.   MRN: [id number]   Subjective:    Patient ID: Dakota Martin, male    DOB: Dec 25, 1940, 74 y.o.   MRN: [id number]  HPI 74 yo male known to Dakota Martin. He has family history of colon cancer in her is mother diagnosed in her early 51s and personal history of adenomatous colon polyp on colonoscopy in 2011. He had a negative colonoscopy in 2005. Patient comes in today to discuss follow-up colonoscopy. 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 He is followed by Dakota Martin and has been on Coumadin long-term. Patient has done his last 2 colonoscopies while on Coumadin. Patient has no current GI complaints, has occasional mild constipation no melena or hematochezia. He prefers to do Colonoscopy while being maintained on Coumadin.  Review of Systems Pertinent positive and negative review of systems were noted in the above HPI section.  All other review of systems was otherwise negative.  Outpatient Encounter Prescriptions as of 06/30/2014  Medication Sig  . allopurinol (ZYLOPRIM) 300 MG tablet Take 300 mg by mouth daily.  [name] Kitchen amLODipine (NORVASC) 5 MG tablet Take 5 mg by mouth daily.  [name] Kitchen amoxicillin (AMOXIL) 500 MG capsule Take 500 mg by mouth as needed. For dental visits  . atorvastatin (LIPITOR) 10 MG tablet Take 10 mg by mouth daily.  . carvedilol (COREG) 6.25 MG tablet Take 1 tablet (6.25 mg total) by mouth 2 (two) times daily.  . Cholecalciferol (VITAMIN D-3 PO) Take by mouth.  . Coenzyme Q10 (CO Q 10 PO) Take by mouth.  . Cyanocobalamin (VITAMIN B-12) 5000 MCG SUBL Place under the tongue once a week.  . ezetimibe (ZETIA) 10 MG tablet Take 10 mg by mouth daily.  . fish oil-omega-3 fatty acids 1000 MG capsule Take 2 g by mouth daily.  . Fluticasone Propionate (FLONASE NA) Place into the nose.  .  hydrochlorothiazide (MICROZIDE) 12.5 MG capsule Take 1 capsule (12.5 mg total) by mouth daily.  . Magnesium 500 MG CAPS Take 500 mg by mouth daily.  [name] Kitchen OVER THE COUNTER MEDICATION AYER-TRIPHALA  . potassium chloride (K-DUR) 10 MEQ tablet Take 1 tablet (10 mEq total) by mouth daily.  [name] Kitchen telmisartan (MICARDIS) 80 MG tablet Take 80 mg by mouth daily.  [name] Kitchen warfarin (COUMADIN) 5 MG tablet Take 5 mg by mouth daily. Pt takes 1 1/2 tablets on M, W, Thurs, Sat and Sunday; Pt takes 1 tablet on Tuesday and Friday  . MOVIPREP 100 G SOLR Take as directed for colonoscopy prep.   No Known Allergies Patient Active Problem List   Diagnosis Date Noted  . Dakota/P aortic valve replacement with metallic valve 17/40/8144  . Glenohumeral arthritis 07/28/2013  . HYPERLIPIDEMIA 04/21/2009  . GOUT, UNSPECIFIED 04/21/2009  . Essential hypertension 04/21/2009  . EXTERNAL HEMORRHOIDS 04/21/2009  . AORTIC STENOSIS, CONGENITAL 04/21/2009   History   Social History  . Marital Status: Married    Spouse Name: N/A  . Number of Children: 2  . Years of Education: N/A   Occupational History  . Retired    Social History Main Topics  . Smoking status: Never Smoker   . Smokeless tobacco: Never Used  . Alcohol Use: 0.0 oz/week    0 Standard drinks or equivalent per week     Comment: 2 drinks a day  . Drug Use: No  . Sexual  Activity: Not on file   Other Topics Concern  . 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 family history includes Colon cancer in his mother; Diabetes in his maternal grandfather; Hyperlipidemia in his mother. There is no history of Colon polyps, Kidney disease, Esophageal cancer, or Gallbladder disease.      Objective:    Filed Vitals:   06/30/14 1039  BP: 122/56  Pulse: 60    Physical Exam  well-developed elderly white male in no acute distress, blood pressure 122/56  pulse 60 height 5 foot 9 weight 174. HEENT; nontraumatic normocephalic EOMI PERRLA sclera anicteric, Supple ;no JVD, Cardiovascular ;regular rate and rhythm with S1-S2 no murmur or gallop, Pulmonary; clear bilaterally, Abdomen ;soft nontender nondistended bowel sounds are active there is no palpable mass or hepatosplenomegaly, Rectal; exam not done, Extremities; no clubbing cyanosis or edema skin warm and dry, Psych; mood and affect appropriate     Assessment & Plan:   #36 74 year old male who comes in to discuss 5 year follow-up colonoscopy. He had colonoscopy in 2011 with finding of one 3 mm adenomatous polyp. He has family history of colon cancer diagnosed in his mother in her early 37s. Patient is currently asymptomatic #2Chronic anticoagulation with Coumadin #3 history of congenital aortic stenosis status post remote aortic valve replacement 1982 with mechanical valve #4 mild aortic root dilation being followed by Dakota Martin #5 hypertension  Plan. Patient will be scheduled for colonoscopy with Dakota Martin.  Procedure was discussed in detail with patient. He has done 2 prior colonoscopies on Coumadin and prefers to have colonoscopy while being maintained on Coumadin . We did discuss increased risk of bleeding complications particularly if he were to have a complication and/or polypectomies.. Patient voices understanding and is agreeable to proceed.   Dakota Martin Dakota [name] PA-C 06/30/2014

## 2014-07-01 ENCOUNTER — Other Ambulatory Visit: Payer: Self-pay

## 2014-07-01 DIAGNOSIS — K635 Polyp of colon: Secondary | ICD-10-CM

## 2014-07-01 NOTE — Progress Notes (Signed)
Pt coming for PT/INR Monday 07/05/14 per Nicoletta Ba PA.

## 2014-07-01 NOTE — Progress Notes (Signed)
Reviewed

## 2014-07-02 ENCOUNTER — Telehealth: Payer: Self-pay | Admitting: Physician Assistant

## 2014-07-02 NOTE — Telephone Encounter (Signed)
Patient asking what the goal is for his INR. Advised it is 2.0 to 2.5 per Dr Olevia Perches. He will come in on Monday to have the INR checked by venipuncture

## 2014-07-05 ENCOUNTER — Other Ambulatory Visit (INDEPENDENT_AMBULATORY_CARE_PROVIDER_SITE_OTHER): Payer: Medicare Other

## 2014-07-05 ENCOUNTER — Telehealth: Payer: Self-pay | Admitting: Internal Medicine

## 2014-07-05 DIAGNOSIS — K635 Polyp of colon: Secondary | ICD-10-CM | POA: Diagnosis not present

## 2014-07-05 DIAGNOSIS — Z5181 Encounter for therapeutic drug level monitoring: Secondary | ICD-10-CM | POA: Diagnosis not present

## 2014-07-05 LAB — PROTIME-INR
INR: 4 ratio — ABNORMAL HIGH (ref 0.8–1.0)
PROTHROMBIN TIME: 43 s — AB (ref 9.6–13.1)

## 2014-07-05 NOTE — Telephone Encounter (Signed)
Spoke with patient and gave him results of PT. He will hold his Coumadin dose tonight and wait for final recommendation from Dr. Olevia Perches when she clarifies her recommendations.

## 2014-07-06 ENCOUNTER — Other Ambulatory Visit: Payer: Self-pay | Admitting: *Deleted

## 2014-07-06 DIAGNOSIS — Z7901 Long term (current) use of anticoagulants: Secondary | ICD-10-CM

## 2014-07-06 NOTE — Telephone Encounter (Signed)
See results note on 07/05/14.

## 2014-07-09 ENCOUNTER — Other Ambulatory Visit (INDEPENDENT_AMBULATORY_CARE_PROVIDER_SITE_OTHER): Payer: Medicare Other

## 2014-07-09 ENCOUNTER — Other Ambulatory Visit: Payer: Self-pay | Admitting: *Deleted

## 2014-07-09 DIAGNOSIS — Z1211 Encounter for screening for malignant neoplasm of colon: Secondary | ICD-10-CM

## 2014-07-09 DIAGNOSIS — Z7901 Long term (current) use of anticoagulants: Secondary | ICD-10-CM

## 2014-07-09 LAB — PROTIME-INR
INR: 2.4 ratio — AB (ref 0.8–1.0)
PROTHROMBIN TIME: 26 s — AB (ref 9.6–13.1)

## 2014-07-12 ENCOUNTER — Telehealth: Payer: Self-pay | Admitting: Internal Medicine

## 2014-07-12 ENCOUNTER — Other Ambulatory Visit (INDEPENDENT_AMBULATORY_CARE_PROVIDER_SITE_OTHER): Payer: Medicare Other

## 2014-07-12 DIAGNOSIS — Z5181 Encounter for therapeutic drug level monitoring: Secondary | ICD-10-CM | POA: Diagnosis not present

## 2014-07-12 DIAGNOSIS — Z1211 Encounter for screening for malignant neoplasm of colon: Secondary | ICD-10-CM

## 2014-07-12 LAB — PROTIME-INR
INR: 2.9 ratio — AB (ref 0.8–1.0)
PROTHROMBIN TIME: 31.2 s — AB (ref 9.6–13.1)

## 2014-07-12 NOTE — Telephone Encounter (Signed)
Pt calling for lab results, please advise. 

## 2014-07-13 ENCOUNTER — Ambulatory Visit (AMBULATORY_SURGERY_CENTER): Payer: Medicare Other | Admitting: Internal Medicine

## 2014-07-13 ENCOUNTER — Encounter: Payer: Self-pay | Admitting: Internal Medicine

## 2014-07-13 VITALS — BP 122/61 | HR 53 | Temp 96.2°F | Resp 47 | Ht 69.0 in | Wt 174.0 lb

## 2014-07-13 DIAGNOSIS — Z86010 Personal history of colonic polyps: Secondary | ICD-10-CM

## 2014-07-13 DIAGNOSIS — D122 Benign neoplasm of ascending colon: Secondary | ICD-10-CM

## 2014-07-13 HISTORY — PX: COLONOSCOPY: SHX174

## 2014-07-13 MED ORDER — SODIUM CHLORIDE 0.9 % IV SOLN
500.0000 mL | INTRAVENOUS | Status: DC
Start: 2014-07-13 — End: 2014-07-14

## 2014-07-13 NOTE — Progress Notes (Signed)
Called to room to assist during endoscopic procedure.  Patient ID and intended procedure confirmed with present staff. Received instructions for my participation in the procedure from the performing physician.  

## 2014-07-13 NOTE — Op Note (Signed)
[place]  [address] [place], [id number]   COLONOSCOPY PROCEDURE REPORT  PATIENT: Dakota Martin, Dakota Martin  MR#: [id number] BIRTHDATE: [birth year] , 73  yrs. old GENDER: male ENDOSCOPIST: Lafayette Dragon, MD REFERRED TI:WPYK Perini, M.D. PROCEDURE DATE:  07/13/2014 PROCEDURE:   Colonoscopy, surveillance and Colonoscopy with snare polypectomy First Screening Colonoscopy - Avg.  risk and is 50 yrs.  old or older - No.  Prior Negative Screening - Now for repeat screening. N/A  History of Adenoma - Now for follow-up colonoscopy & has been > or = to 3 yrs.  Yes hx of adenoma.  Has been 3 or more years since last colonoscopy. ASA CLASS:   Class III INDICATIONS:Surveillance due to prior colonic neoplasia and parent with colon cancer.  Patient has continued Coumadin.  History of adenomatous polyp of the sigmoid colon in February 2011. MEDICATIONS: Monitored anesthesia care and Propofol 250 mg IV  DESCRIPTION OF PROCEDURE:   After the risks benefits and alternatives of the procedure were thoroughly explained, informed consent was obtained.  The digital rectal exam revealed no abnormalities of the rectum.   The LB [id number]  endoscope was introduced through the anus and advanced to the cecum, which was identified by both the appendix and ileocecal valve. No adverse events experienced.   The quality of the prep was good.  (MoviPrep was used)  The instrument was then slowly withdrawn as the colon was fully examined.      COLON FINDINGS: A semi-pedunculated polyp measuring 10 mm in size was found in the ascending colon.  A polypectomy was performed using snare cautery.  The resection was complete, the polyp tissue was completely retrieved and sent to histology.  Retroflexed views revealed no abnormalities. The time to cecum = 5.46 Withdrawal time = 9.32   The scope was withdrawn and the procedure completed. COMPLICATIONS: There were no immediate  complications.  ENDOSCOPIC IMPRESSION: Semi-pedunculated polyp was found in the ascending colon; polypectomy was performed using snare cautery  RECOMMENDATIONS: 1.  Await pathology results 2.  Resume Coumadin tomorrow ,recheck INR on Friday, 07/16/2014. Keep INR less than 2.5 for 2 weeks,  eSigned:  Lafayette Dragon, MD 07/13/2014 9:17 AM   cc:   PATIENT NAME:  Dakota Martin, Dakota Martin MR#: [id number]

## 2014-07-13 NOTE — Patient Instructions (Addendum)
YOU HAD AN ENDOSCOPIC PROCEDURE TODAY AT [facility]:   Refer to the procedure report that was given to you for any specific questions about what was found during the examination.  If the procedure report does not answer your questions, please call your gastroenterologist to clarify.  If you requested that your care partner not be given the details of your procedure findings, then the procedure report has been included in a sealed envelope for you to review at your convenience later.  YOU SHOULD EXPECT: Some feelings of bloating in the abdomen. Passage of more gas than usual.  Walking can help get rid of the air that was put into your GI tract during the procedure and reduce the bloating. If you had a lower endoscopy (such as a colonoscopy or flexible sigmoidoscopy) you may notice spotting of blood in your stool or on the toilet paper. If you underwent a bowel prep for your procedure, you may not have a normal bowel movement for a few days.  Please Note:  You might notice some irritation and congestion in your nose or some drainage.  This is from the oxygen used during your procedure.  There is no need for concern and it should clear up in a day or so.  SYMPTOMS TO REPORT IMMEDIATELY:   Following lower endoscopy (colonoscopy or flexible sigmoidoscopy):  Excessive amounts of blood in the stool  Significant tenderness or worsening of abdominal pains  Swelling of the abdomen that is new, acute  Fever of 100F or higher  For urgent or emergent issues, a gastroenterologist can be reached at any hour by calling [phone].   DIET: Your first meal following the procedure should be a small meal and then it is ok to progress to your normal diet. Heavy or fried foods are harder to digest and may make you feel nauseous or bloated.  Likewise, meals heavy in dairy and vegetables can increase bloating.  Drink plenty of fluids but you should avoid alcoholic beverages for 24  hours.  ACTIVITY:  You should plan to take it easy for the rest of today and you should NOT DRIVE or use heavy machinery until tomorrow (because of the sedation medicines used during the test).    FOLLOW UP: Our staff will call the number listed on your records the next business day following your procedure to check on you and address any questions or concerns that you may have regarding the information given to you following your procedure. If we do not reach you, we will leave a message.  However, if you are feeling well and you are not experiencing any problems, there is no need to return our call.  We will assume that you have returned to your regular daily activities without incident  If any biopsies were taken you will be contacted by phone or by letter within the next 1-3 weeks.  Please call us at [phone] if you have not heard about the biopsies in 3 weeks.    SIGNATURES/CONFIDENTIALITY: You and/or your care partner have signed paperwork which will be entered into your electronic medical record.  These signatures attest to the fact that that the information above on your After Visit Summary has been reviewed and is understood.  Full responsibility of the confidentiality of this discharge information lies with you and/or your care-partner.    Handout weas given to your care partner on polyps. You might notice some irritation in your nose or drainage.  This may cause  feelings of congestion.  This is from the oxygen, which can be drying.  This is no cause for concern; this should clear up in a few days.  You may resume your current medications today.  Resume coumadin tomorrow per Dr. Olevia Perches. Await biopsy results. Have INR checked Friday at Dr. Silvestre Mesi office 9:00 - 4:00 no appointment time needed.  Keep INR less than 2.5 for 2 weeks.  Estill B[place]berg, [place] has order. Please call if any questions or concerns.

## 2014-07-13 NOTE — Progress Notes (Signed)
No problems noted in the recovery room. Maw  Called Dr. Silvestre Mesi office and sopke with Estill Bamberg, [place] gave her order for pt to have INR checked Friday 07-16-14 anytime between 9:00 - 4:00 pm.  To keep INR less than 2.5 for 2 weeks per Dr. Delfin Edis.  Called her at 09:34. Corky Sing

## 2014-07-14 ENCOUNTER — Telehealth: Payer: Self-pay | Admitting: *Deleted

## 2014-07-14 NOTE — Telephone Encounter (Signed)
  Follow up Call-  Call back number 07/13/2014  Post procedure Call Back phone  # [phone]  Permission to leave phone message Yes     Patient questions:  Do you have a fever, pain , or abdominal swelling? No. Pain Score  0 *  Have you tolerated food without any problems? Yes.    Have you been able to return to your normal activities? Yes.    Do you have any questions about your discharge instructions: Diet   No. Medications  No. Follow up visit  No.  Do you have questions or concerns about your Care? No.  Actions: * If pain score is 4 or above: No action needed, pain <4.

## 2014-07-19 ENCOUNTER — Encounter: Payer: Self-pay | Admitting: Internal Medicine

## 2014-08-10 ENCOUNTER — Telehealth: Payer: Self-pay | Admitting: Cardiovascular Disease

## 2014-08-10 NOTE — Telephone Encounter (Signed)
ERROR

## 2014-08-17 ENCOUNTER — Ambulatory Visit (INDEPENDENT_AMBULATORY_CARE_PROVIDER_SITE_OTHER): Payer: Medicare Other | Admitting: Cardiovascular Disease

## 2014-08-17 ENCOUNTER — Encounter: Payer: Self-pay | Admitting: Cardiovascular Disease

## 2014-08-17 VITALS — BP 124/70 | HR 80 | Ht 69.0 in | Wt 169.2 lb

## 2014-08-17 DIAGNOSIS — Z952 Presence of prosthetic heart valve: Secondary | ICD-10-CM

## 2014-08-17 DIAGNOSIS — Z954 Presence of other heart-valve replacement: Secondary | ICD-10-CM

## 2014-08-17 DIAGNOSIS — I10 Essential (primary) hypertension: Secondary | ICD-10-CM

## 2014-08-17 MED ORDER — POTASSIUM CHLORIDE ER 10 MEQ PO TBCR: 10.0000 meq | EXTENDED_RELEASE_TABLET | Freq: Every day | ORAL | Status: DC

## 2014-08-17 MED ORDER — HYDROCHLOROTHIAZIDE 12.5 MG PO CAPS: 12.5000 mg | ORAL_CAPSULE | Freq: Every day | ORAL | Status: DC

## 2014-08-17 MED ORDER — CARVEDILOL 6.25 MG PO TABS: 6.2500 mg | ORAL_TABLET | Freq: Two times a day (BID) | ORAL | Status: DC

## 2014-08-17 NOTE — Progress Notes (Signed)
Cardiology Office Note   Date:  08/17/2014   ID:  Dakota Martin, DOB 12-04-1940, MRN [id number]  PCP:  Jerlyn Ly, MD  Cardiologist:   Thayer Headings, MD   Chief Complaint  Patient presents with  . Follow-up      August 17, 2014:  Dakota Martin is a 74 y.o. male who presents for follow-up of his essential hypertension and mild aortic root dilatation. We had HCTZ and Kdur at his last visit.  BP readings have been well controlled.    Expand All Collapse All     Problem List: 1. Aortic valve replacement ( June 1982, Onnie Boer, MD) [facility] mechanical valve. 2. Chronic coumadin therapy.  3. Hypertension 4. Hyperlipidemia 5. 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 He has been doing well. No CP , no dyspnea.  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   Oct. 27, 2015:  Dakota Martin is seen back for a scheduled visit. He had a recent echo that revealed a dilated aortic root ( 50 cm)     Jan. 19, 2016:  Dakota Martin  is seen back today for follow-up of his aortic valve replacement and history of hypertension. He has a dilated aortic root. Also has a history of hypertension and hyperlipidemia. He has been recording his BP daily. He has substituted generic Micardis ( Telmisartan)  for his Benicar  August 17, 2014:     Past Medical History  Diagnosis Date  . Hypertension   . Hyperlipidemia   . Gout   . Arthritis   . Colon polyp     Past Surgical History  Procedure Laterality Date  . Aortic valve  1982    prosthetic  val ve  . Tonsillectomy       Current Outpatient Prescriptions  Medication Sig Dispense Refill  . allopurinol (ZYLOPRIM) 300 MG tablet Take 300 mg by mouth daily.    [name] Kitchen amLODipine (NORVASC) 5 MG tablet Take 5 mg by mouth daily.    [name] Kitchen amoxicillin (AMOXIL) 500 MG  capsule Take 500 mg by mouth as needed. For dental visits    . atorvastatin (LIPITOR) 10 MG tablet Take 10 mg by mouth daily.    . carvedilol (COREG) 6.25 MG tablet Take 1 tablet (6.25 mg total) by mouth 2 (two) times daily. 62 tablet 11  . Cholecalciferol (VITAMIN D-3 PO) Take by mouth.    . Coenzyme Q10 (CO Q 10 PO) Take by mouth daily.     . Cyanocobalamin (VITAMIN B-12) 5000 MCG SUBL Place under the tongue once a week.    . ezetimibe (ZETIA) 10 MG tablet Take 10 mg by mouth daily.    . fish oil-omega-3 fatty acids 1000 MG capsule Take 2 g by mouth daily.    . Fluticasone Propionate (FLONASE NA) Place 1 spray into the nose daily as needed.     . hydrochlorothiazide (MICROZIDE) 12.5 MG capsule Take 1 capsule (12.5 mg total) by mouth daily. 31 capsule 11  . Magnesium 500 MG CAPS Take 500 mg by mouth daily.    [name] Kitchen OVER THE COUNTER MEDICATION AYER-TRIPHALA    . potassium chloride (K-DUR) 10 MEQ tablet Take 1 tablet (10 mEq total) by mouth daily. 31 tablet 11  . telmisartan (MICARDIS) 80 MG tablet Take 80  mg by mouth daily.  11  . warfarin (COUMADIN) 5 MG tablet Take 5 mg by mouth daily. Pt takes 1 1/2 tablets on M, W, Thurs, Sat and Sunday; Pt takes 1 tablet on Tuesday and Friday     No current facility-administered medications for this visit.    Allergies:   Review of patient's allergies indicates no known allergies.    Social History:  The patient  reports that he has never smoked. He has never used smokeless tobacco. He reports that he drinks alcohol. He reports that he does not use illicit drugs.   Family History:  The patient's family history includes Colon cancer in his mother; Diabetes in his maternal grandfather; Hyperlipidemia in his mother. There is no history of Colon polyps, Kidney disease, Esophageal cancer, or Gallbladder disease.    ROS:  Please see the history of present illness.   Otherwise, review of systems are positive for none.   All other systems are reviewed and  negative.    PHYSICAL EXAM: VS:  BP 124/70 mmHg  Pulse 80  Ht 5\' 9"  (1.753 m)  Wt 169 lb 3.2 oz (76.749 kg)  BMI 24.98 kg/m2 , BMI Body mass index is 24.98 kg/(m^2). GEN: Well nourished, well developed, in no acute distress HEENT: normal Neck: no JVD, carotid bruits, or masses Cardiac: RRR normal S1, mechanical S2. Respiratory:  clear to auscultation bilaterally, normal work of breathing GI: soft, nontender, nondistended, + BS MS: no deformity or atrophy Skin: warm and dry, no rash Neuro:  Strength and sensation are intact Psych: euthymic mood, full affect   EKG:  EKG is not ordered today.    Recent Labs: 06/22/2014: BUN 22; Creatinine 1.09; Potassium 4.4; Sodium 136    Lipid Panel No results found for: CHOL, TRIG, HDL, CHOLHDL, VLDL, LDLCALC, LDLDIRECT    Wt Readings from Last 3 Encounters:  08/17/14 169 lb 3.2 oz (76.749 kg)  07/13/14 174 lb (78.926 kg)  06/30/14 174 lb 6 oz (79.096 kg)      Other studies Reviewed:    ASSESSMENT AND PLAN:  1.  Essential Hypertension:  He has made some substitutions with his BP meds..  He has gradually increased his Coreg.  His systolic blood pressure remains mildly elevated. He's on max dose Micardis. We will add HCTZ 12.5 mg a day. We will also add potassium chloride 10 mEq a day. He will have a basic metabolic profile drawn in one month. I'll see him again in 3 months for follow-up office visit and basic metabolic profile.  2. Aortic valve replacement:   Remains on coumadin.  His valve sounds normal. He does have a mildly dilated aortic root which is being followed by Dr. Roxan Hockey. He has another CT scheduled for later this year.  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 His INR levels are followed through his primary medical doctor.  4. Hyperlipidemia: Continue Lipitor. He's followed by his primary medical doctor.    Current medicines are reviewed at length with the patient today.  The  patient does not have concerns regarding medicines.  The following changes have been made:  We'll add HCTZ 12.5 mg a day. We will also add potassium chloride 10 mEq a day.  Labs/ tests ordered today include:  No orders of the defined types were placed in this encounter.     Disposition:   FU with me  in 3 months. Will check BMP in 1 month.    Signed, Nahser, Arnette Norris  J, MD  08/17/2014 9:59 AM    [facility] [address], [place], [place]  [address] Phone: [phone]; Fax: [phone]

## 2014-08-17 NOTE — Patient Instructions (Signed)
Medication Instructions:  Your physician recommends that you continue on your current medications as directed. Please refer to the Current Medication list given to you today.  Labwork: Your physician recommends that you return for lab work in: 6 months on the same day as your appointment with Dr. Acie Fredrickson for basic metabolic panel    Testing/Procedures: None  Follow-Up: Your physician wants you to follow-up in: 6 months with Dr. Acie Fredrickson.  You will receive a reminder letter in the mail two months in advance. If you don't receive a letter, please call our office to schedule the follow-up appointment.

## 2014-08-23 ENCOUNTER — Other Ambulatory Visit: Payer: Self-pay | Admitting: *Deleted

## 2014-08-23 DIAGNOSIS — I7121 Aneurysm of the ascending aorta, without rupture: Secondary | ICD-10-CM

## 2014-08-23 DIAGNOSIS — I712 Thoracic aortic aneurysm, without rupture: Secondary | ICD-10-CM

## 2014-09-09 ENCOUNTER — Other Ambulatory Visit: Payer: Self-pay | Admitting: *Deleted

## 2014-09-09 DIAGNOSIS — I7121 Aneurysm of the ascending aorta, without rupture: Secondary | ICD-10-CM

## 2014-09-09 DIAGNOSIS — I712 Thoracic aortic aneurysm, without rupture: Secondary | ICD-10-CM

## 2014-09-09 LAB — BUN: BUN: 28 mg/dL — ABNORMAL HIGH (ref 6–23)

## 2014-09-09 LAB — CREATININE, SERUM: Creat: 1.3 mg/dL (ref 0.50–1.35)

## 2014-09-14 ENCOUNTER — Encounter: Payer: Self-pay | Admitting: Thoracic Surgery (Cardiothoracic Vascular Surgery)

## 2014-09-14 ENCOUNTER — Ambulatory Visit (INDEPENDENT_AMBULATORY_CARE_PROVIDER_SITE_OTHER): Payer: Medicare Other | Admitting: Thoracic Surgery (Cardiothoracic Vascular Surgery)

## 2014-09-14 ENCOUNTER — Ambulatory Visit
Admission: RE | Admit: 2014-09-14 | Discharge: 2014-09-14 | Disposition: A | Discharge status: Home / Self Care | Payer: Medicare Other | Source: Ambulatory Visit | Attending: Thoracic Surgery (Cardiothoracic Vascular Surgery) | Admitting: Thoracic Surgery (Cardiothoracic Vascular Surgery)

## 2014-09-14 VITALS — BP 146/75 | HR 68 | Resp 16 | Ht 69.0 in | Wt 169.0 lb

## 2014-09-14 DIAGNOSIS — I7121 Aneurysm of the ascending aorta, without rupture: Secondary | ICD-10-CM

## 2014-09-14 DIAGNOSIS — I712 Thoracic aortic aneurysm, without rupture, unspecified: Secondary | ICD-10-CM

## 2014-09-14 MED ORDER — IOPAMIDOL (ISOVUE-370) INJECTION 76%
75.0000 mL | Freq: Once | INTRAVENOUS | Status: AC | PRN
  Administered 2014-09-14: 75 mL via INTRAVENOUS

## 2014-09-14 NOTE — Progress Notes (Signed)
[address]       [place] [address]             [phone]       HPI:  Dakota Martin returns today for 6 month follow-up visit.  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 Bradly Bienenstock back in 1982. He went to Dr. Acie Fredrickson last fall for a follow-up visit. Echocardiogram showed some gradient across the valve with a mean gradient of 15 mmHg. The peak gradient was about 31 mmHg. The aortic root was a little dilated so a CT of the chest was done. 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  He has been taking his beta blocker. He says it does affect him a little bit in terms of his speed of thought and his reactions on the tennis court. He has not been having any chest pain or shortness of breath.  He was recently started on hydrochlorothiazide by Dr. Acie Fredrickson  Past Medical History  Diagnosis Date  . Hypertension   . Hyperlipidemia   . Gout   . Arthritis   . Colon polyp      Current Outpatient Prescriptions  Medication Sig Dispense Refill  . allopurinol (ZYLOPRIM) 300 MG tablet Take 300 mg by mouth daily.    [name] Kitchen amLODipine (NORVASC) 5 MG tablet Take 5 mg by mouth daily.    [name] Kitchen amoxicillin (AMOXIL) 500 MG capsule Take 500 mg by mouth as needed. For dental visits    . atorvastatin (LIPITOR) 10 MG tablet Take 10 mg by mouth daily.    . carvedilol (COREG) 6.25 MG tablet Take 1 tablet (6.25 mg total) by mouth 2 (two) times daily. 180 tablet 3  . Cholecalciferol (VITAMIN D-3 PO) Take by mouth.    . Coenzyme Q10 (CO Q 10 PO) Take by mouth daily.     . Cyanocobalamin (VITAMIN B-12) 5000 MCG SUBL Place under the tongue once a week.    . ezetimibe (ZETIA) 10 MG tablet Take 10 mg by mouth daily.    . fish oil-omega-3 fatty acids 1000 MG capsule Take 2 g by mouth daily.    . Fluticasone Propionate (FLONASE NA) Place 1 spray into the nose daily as needed.     . hydrochlorothiazide (MICROZIDE) 12.5 MG capsule Take 1 capsule  (12.5 mg total) by mouth daily. 90 capsule 3  . Magnesium 500 MG CAPS Take 500 mg by mouth daily.    [name] Kitchen OVER THE COUNTER MEDICATION AYER-TRIPHALA    . potassium chloride (K-DUR) 10 MEQ tablet Take 1 tablet (10 mEq total) by mouth daily. 90 tablet 3  . telmisartan (MICARDIS) 80 MG tablet Take 80 mg by mouth daily.  11  . warfarin (COUMADIN) 5 MG tablet Take 5 mg by mouth daily. Pt takes 1 1/2 tablets on M, W, Thurs, Sat and Sunday; Pt takes 1 tablet on Tuesday and Friday     No current facility-administered medications for this visit.    Physical Exam BP 146/75 mmHg  Pulse 68  Resp 16  Ht 5\' 9"  (1.753 m)  Wt 169 lb (76.658 kg)  BMI 24.95 kg/m2  SpO31 25% 74 year old male in no acute distress Alert and oriented 3 with no focal deficits Transmitted murmur versus faint bruit bilateral carotids Cardiac regular rate and rhythm with a 2/6 systolic murmur Lungs clear with equal breath bilaterally No peripheral edema  Diagnostic Tests: CT ANGIOGRAPHY CHEST WITH  CONTRAST  TECHNIQUE: Multidetector CT imaging of the chest was performed using the standard protocol during bolus administration of intravenous contrast. Multiplanar CT image reconstructions and MIPs were obtained to evaluate the vascular anatomy.  CONTRAST: 75 mL Isovue 370 IV  COMPARISON: Unenhanced CT of the chest on 03/04/2014  FINDINGS: There is stable aneurysmal disease of the ascending thoracic aorta with maximal measured diameter of 4.8 cm. No associated dissection. The proximal arch measures 3.4 cm. The distal arch measures 3.0 cm. The descending thoracic aorta measures 2.0 cm. Proximal great vessels are well opacified and show normal patency and branching pattern. No abnormalities are seen at the level of the aortic valve plane with stable appearance of a prosthetic valve.  The heart size is normal. Stable calcified plaque in the distribution of the LAD and left circumflex coronary arteries. The right  coronary artery is also calcified and blurred by motion.  No pleural or pericardial fluid identified. Lungs show no evidence of nodules or infiltrates. No lymphadenopathy identified. Visualized upper abdomen shows stable appearance of multiple small and benign appearing hepatic cysts. Suggestion of minimal hiatal hernia. Stable spondylosis of the thoracic spine.  Review of the MIP images confirms the above findings.  IMPRESSION: Stable aneurysmal disease of the ascending thoracic aorta which measures 4.8 cm in maximum diameter. No evidence of aortic dissection.   Electronically Signed  By: Aletta Edouard M.D.  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 He was found to have an approximately 5 cm ascending aortic aneurysm last fall. He now returns for 6 month follow-up and his CT scan is unchanged. The aneurysm measured at 4.8 cm but that is within the margin of error.  There is no indication for surgery at this time, but he will need continued follow-up.  I recommended that he return in 6 months with a repeat scan. That will give [place] out to a year from his original scan. Depending on the results of that we can discuss possibly lengthening the interval between scans.  Plan: Return in 6 months with CT of chest  Melrose Nakayama, MD [facility] [phone]

## 2014-10-08 NOTE — Telephone Encounter (Signed)
Pt seen

## 2014-10-25 ENCOUNTER — Other Ambulatory Visit: Payer: Self-pay

## 2014-11-26 ENCOUNTER — Encounter: Payer: Self-pay | Admitting: Cardiovascular Disease

## 2015-01-07 ENCOUNTER — Other Ambulatory Visit: Payer: Self-pay | Admitting: Internal Medicine

## 2015-01-07 ENCOUNTER — Encounter: Payer: Self-pay | Admitting: Internal Medicine

## 2015-01-07 DIAGNOSIS — R591 Generalized enlarged lymph nodes: Secondary | ICD-10-CM

## 2015-01-11 ENCOUNTER — Ambulatory Visit
Admission: RE | Admit: 2015-01-11 | Discharge: 2015-01-11 | Disposition: A | Discharge status: Home / Self Care | Payer: Medicare Other | Source: Ambulatory Visit | Attending: Internal Medicine | Admitting: Internal Medicine

## 2015-01-11 DIAGNOSIS — R591 Generalized enlarged lymph nodes: Secondary | ICD-10-CM

## 2015-01-11 MED ORDER — IOPAMIDOL (ISOVUE-300) INJECTION 61%
75.0000 mL | Freq: Once | INTRAVENOUS | Status: AC | PRN
  Administered 2015-01-11: 75 mL via INTRAVENOUS

## 2015-01-12 ENCOUNTER — Ambulatory Visit (INDEPENDENT_AMBULATORY_CARE_PROVIDER_SITE_OTHER): Payer: Medicare Other | Admitting: Sports Medicine

## 2015-01-12 ENCOUNTER — Encounter: Payer: Self-pay | Admitting: Sports Medicine

## 2015-01-12 VITALS — BP 124/61 | Ht 69.0 in | Wt 169.0 lb

## 2015-01-12 DIAGNOSIS — M67431 Ganglion, right wrist: Secondary | ICD-10-CM

## 2015-01-12 NOTE — Progress Notes (Signed)
   Subjective:    Patient ID: Dakota Martin, male    DOB: August 04, 1940, 74 y.o.   MRN: [id number]  HPI chief complaint: Right wrist cyst  Patient comes in today complaining of a cyst that has formed over the volar aspect of his right wrist. No trauma. Cyst is relatively non-painful. He was evaluated by one of the local hand surgeons who recommended surgical excision. Patient is not wanting to go through with this. The idea of aspiration was suggested by the patient to the orthopedist but the orthopedist said that the cyst would probably return. He denies numbness or tingling into his fingers. Again it is relatively painless but he is here today primarily to see if I would be willing to aspirate the cyst. He denies any trauma to the wrist. He did have a wrist x-ray done by the orthopedist but the results are unavailable.  Interim medical history reviewed Medications reviewed Allergies reviewed    Review of Systems     Objective:   Physical Exam Well-developed, well-nourished. No acute distress. Awake alert and oriented 3.  Right wrist: There is a fluctuant ganglion cyst along the volar aspect of his wrist. It is nontender. It is nonpulsatile. It is nonerythematous. He has full painless wrist range of motion. Negative Tinel's over the carpal tunnel. Good grip strength. Good radial and ulnar pulses.  MSK ultrasound of the right wrist was performed. This was a limited scan. There is a superficial ganglion cyst just ulnar to the radial artery and nerve. It sits just above the flexor carpi radialis tendon. There is no blood flow in the cyst. It is easily compressible. Findings are consistent with a superficial ganglion cyst.       Assessment & Plan:  Ganglion cyst, right wrist  Utilizing ultrasound guidance patient's ganglion cyst was aspirated using an 18-gauge needle. This was done after local infiltration of 1% Xylocaine for anesthesia. Aspiration resulted in 1 mL of gelatinous  material and complete collapse of the cyst on ultrasound. Care was taken not to hit the radial artery or vein. Patient tolerated the procedure without difficulty and post procedure the cyst had resolved. I recommended compression for the next 48 hours. Patient understands that there is a chance that the cyst could return but if that is the case I would still recommend against surgery unless the cyst becomes symptomatic. Patient agrees. We also discussed the possibility of a body helix wrist compression sleeve but he would like to hold on that for now. No restrictions on activity. Follow-up as needed.  Consent obtained and verified. Time-out conducted. Noted no overlying erythema, induration, or other signs of local infection. Skin prepped in a sterile fashion. Topical analgesic spray: Ethyl chloride. Joint: Left wrist volar ganglion cyst Needle: 18g  Completed without difficulty. Meds: 0.5cc 1% xylocaine for local anesthesia prior to procedure  Advised to call if fevers/chills, erythema, induration, drainage, or persistent bleeding.

## 2015-02-22 ENCOUNTER — Other Ambulatory Visit (HOSPITAL_COMMUNITY): Payer: Self-pay | Admitting: Otolaryngology

## 2015-02-22 DIAGNOSIS — R591 Generalized enlarged lymph nodes: Secondary | ICD-10-CM

## 2015-02-24 ENCOUNTER — Other Ambulatory Visit: Payer: Self-pay | Admitting: *Deleted

## 2015-02-24 DIAGNOSIS — I712 Thoracic aortic aneurysm, without rupture, unspecified: Secondary | ICD-10-CM

## 2015-02-25 ENCOUNTER — Telehealth (HOSPITAL_COMMUNITY): Payer: Self-pay

## 2015-03-01 ENCOUNTER — Encounter: Payer: Self-pay | Admitting: *Deleted

## 2015-03-04 ENCOUNTER — Encounter: Payer: Self-pay | Admitting: Cardiovascular Disease

## 2015-03-04 ENCOUNTER — Other Ambulatory Visit (INDEPENDENT_AMBULATORY_CARE_PROVIDER_SITE_OTHER): Payer: Medicare Other | Admitting: *Deleted

## 2015-03-04 ENCOUNTER — Ambulatory Visit (INDEPENDENT_AMBULATORY_CARE_PROVIDER_SITE_OTHER): Payer: Medicare Other | Admitting: Cardiovascular Disease

## 2015-03-04 VITALS — BP 132/66 | HR 69 | Ht 69.0 in | Wt 173.8 lb

## 2015-03-04 DIAGNOSIS — Z954 Presence of other heart-valve replacement: Secondary | ICD-10-CM

## 2015-03-04 DIAGNOSIS — I10 Essential (primary) hypertension: Secondary | ICD-10-CM | POA: Diagnosis not present

## 2015-03-04 DIAGNOSIS — Z952 Presence of prosthetic heart valve: Secondary | ICD-10-CM

## 2015-03-04 LAB — BASIC METABOLIC PANEL
BUN: 15 mg/dL (ref 7–25)
CO2: 27 mmol/L (ref 20–31)
Calcium: 9.3 mg/dL (ref 8.6–10.3)
Chloride: 100 mmol/L (ref 98–110)
Creat: 1.06 mg/dL (ref 0.70–1.18)
Glucose, Bld: 107 mg/dL — ABNORMAL HIGH (ref 65–99)
Potassium: 4.5 mmol/L (ref 3.5–5.3)
SODIUM: 136 mmol/L (ref 135–146)

## 2015-03-04 NOTE — Patient Instructions (Signed)
Medication Instructions:  Your physician recommends that you continue on your current medications as directed. Please refer to the Current Medication list given to you today.   Labwork: None Ordered   Testing/Procedures: None Ordered   Follow-Up: Your physician wants you to follow-up in: 7 months with Dr. Acie Fredrickson.  You will receive a reminder letter in the mail two months in advance. If you don't receive a letter, please call our office to schedule the follow-up appointment.   If you need a refill on your cardiac medications before your next appointment, please call your pharmacy.   Thank you for choosing [facility]! Christen Bame, RN [phone]

## 2015-03-04 NOTE — Progress Notes (Signed)
Cardiology Office Note   Date:  03/04/2015   ID:  Dakota Martin, DOB 1941/04/27, MRN [id number]  PCP:  Jerlyn Ly, MD  Cardiologist:   Thayer Headings, MD   Chief Complaint  Patient presents with  . Follow-up    AVR, thoracic aortic aneurism      August 17, 2014:  Dakota Martin is a 74 y.o. male who presents for follow-up of his essential hypertension and mild aortic root dilatation. We had HCTZ and Kdur at his last visit.  BP readings have been well controlled.    Expand All Collapse All     Problem List: 1. Aortic valve replacement ( June 1982, Onnie Boer, MD) [facility] mechanical valve. 2. Chronic coumadin therapy.  3. Hypertension 4. Hyperlipidemia 5. Moderate dilation of the ascending aorta ( 50 mm)   History of Present Illness:  Dakota Martin  is a 74 yo with hx of AVR in 1982. He has been doing well. No CP , no dyspnea.  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   Oct. 27, 2015:  Dakota Martin is seen back for a scheduled visit. He had a recent echo that revealed a dilated aortic root ( 50 cm)     Jan. 19, 2016:  Dakota Martin  is seen back today for follow-up of his aortic valve replacement and history of hypertension. He has a dilated aortic root. Also has a history of hypertension and hyperlipidemia. He has been recording his BP daily. He has substituted generic Micardis ( Telmisartan)  for his Benicar  August 17, 2014:    Nov. 4, 2016:  Dakota Martin is seen back today for follow-up visit for his aortic valve replacement and aortic aneurysm. Doing great.  Feeling well.  Has seen Dr. Roxan Hockey - the ascending aortic aneurism seems stable .  Playing tennis regularly  - plays 2-3 times a week .  Going for a right neck lymph node bx.  Bridging with Lovenox.  ( Managed by Scientist, product/process development )   Past Medical History  Diagnosis Date  . Hypertension   .  Hyperlipidemia   . Gout   . Arthritis   . Colon polyp     Past Surgical History  Procedure Laterality Date  . Aortic valve  1982    prosthetic  val ve  . Tonsillectomy       Current Outpatient Prescriptions  Medication Sig Dispense Refill  . allopurinol (ZYLOPRIM) 300 MG tablet Take 300 mg by mouth daily.    [name] Kitchen amLODipine (NORVASC) 5 MG tablet Take 5 mg by mouth daily.    [name] Kitchen amoxicillin (AMOXIL) 500 MG capsule Take 500 mg by mouth as needed. For dental visits    . atorvastatin (LIPITOR) 10 MG tablet Take 10 mg by mouth daily.    . carvedilol (COREG) 6.25 MG tablet Take 1 tablet (6.25 mg total) by mouth 2 (two) times daily. 180 tablet 3  . Cholecalciferol (VITAMIN D-3 PO) Take 1 tablet by mouth daily.     . Coenzyme Q10 (CO Q 10 PO) Take 1 tablet by mouth daily.     . Cyanocobalamin (VITAMIN B-12) 5000 MCG SUBL Place under the tongue once a week.    . enoxaparin (LOVENOX) 80 MG/0.8ML injection Inject 0.8 mLs into the skin 2 (two) times daily.    [name] Kitchen ezetimibe (ZETIA) 10 MG tablet Take 10 mg by mouth daily.    [name] Kitchen  fish oil-omega-3 fatty acids 1000 MG capsule Take 2 g by mouth daily.    . Fluticasone Propionate (FLONASE NA) Place 1 spray into both nostrils daily as needed (AS NEEDED FOR RHINITIS AND ALLERGIES).     . hydrochlorothiazide (MICROZIDE) 12.5 MG capsule Take 1 capsule (12.5 mg total) by mouth daily. 90 capsule 3  . Magnesium 500 MG CAPS Take 500 mg by mouth daily.    [name] Kitchen OVER THE COUNTER MEDICATION AYER-TRIPHALA    . potassium chloride (K-DUR) 10 MEQ tablet Take 1 tablet (10 mEq total) by mouth daily. 90 tablet 3  . telmisartan (MICARDIS) 80 MG tablet Take 80 mg by mouth daily.  11  . warfarin (COUMADIN) 5 MG tablet Take 5 mg by mouth daily. Pt takes 1 1/2 tablets on M, W, Thurs, Sat and Sunday; Pt takes 1 tablet on Tuesday and Friday     No current facility-administered medications for this visit.    Allergies:   Review of patient's allergies indicates no known allergies.     Social History:  The patient  reports that he has never smoked. He has never used smokeless tobacco. He reports that he drinks alcohol. He reports that he does not use illicit drugs.   Family History:  The patient's family history includes Colon cancer in his mother; Diabetes in his maternal grandfather; Emphysema in his father; Hyperlipidemia in his mother; Other in his father. There is no history of Colon polyps, Kidney disease, Esophageal cancer, or Gallbladder disease.    ROS:  Please see the history of present illness.   Otherwise, review of systems are positive for none.   All other systems are reviewed and negative.    PHYSICAL EXAM: VS:  BP 132/66 mmHg  Pulse 69  Ht 5\' 9"  (1.753 m)  Wt 173 lb 12.8 oz (78.835 kg)  BMI 25.65 kg/m2 , BMI Body mass index is 25.65 kg/(m^2). GEN: Well nourished, well developed, in no acute distress HEENT: normal Neck: no JVD, carotid bruits, or masses Cardiac: RRR normal S1, mechanical S2. Respiratory:  clear to auscultation bilaterally, normal work of breathing GI: soft, nontender, nondistended, + BS MS: no deformity or atrophy Skin: warm and dry, no rash Neuro:  Strength and sensation are intact Psych: euthymic mood, full affect   EKG:  EKG is ordered today. 03/04/2015: Normal sinus rhythm at 69. He has nonspecific ST and T wave changes.   Recent Labs: 06/22/2014: Potassium 4.4; Sodium 136 09/09/2014: BUN 28*; Creat 1.30    Lipid Panel No results found for: CHOL, TRIG, HDL, CHOLHDL, VLDL, LDLCALC, LDLDIRECT    Wt Readings from Last 3 Encounters:  03/04/15 173 lb 12.8 oz (78.835 kg)  01/12/15 169 lb (76.658 kg)  09/14/14 169 lb (76.658 kg)      Other studies Reviewed:    ASSESSMENT AND PLAN:  1.  Essential Hypertension:  BP looks great.  Continue current meds    2. Aortic valve replacement:   Remains on coumadin.  His valve sounds normal. He does have a mildly dilated aortic root which is being followed by Dr.  Roxan Hockey. He has another CT scheduled for later this year.  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 His INR levels are followed through his primary medical doctor.  4. Hyperlipidemia: Continue Lipitor. He's followed by his primary medical doctor.    Current medicines are reviewed at length with the patient today.  The patient does not have concerns regarding medicines.  Labs/ tests ordered  today include:  No orders of the defined types were placed in this encounter.     Disposition:   FU with me  in 7   months.    Sandria Mcenroe, Wonda Cheng, MD  03/04/2015 10:46 AM    [address] [address], [place], [place]  [address] Phone: [phone]; Fax: [phone]

## 2015-03-08 ENCOUNTER — Other Ambulatory Visit: Payer: Self-pay | Admitting: Radiology

## 2015-03-09 ENCOUNTER — Ambulatory Visit (HOSPITAL_COMMUNITY)
Admission: RE | Admit: 2015-03-09 | Discharge: 2015-03-09 | Disposition: A | Discharge status: Home / Self Care | Payer: Medicare Other | Source: Ambulatory Visit | Attending: Otolaryngology | Admitting: Otolaryngology

## 2015-03-09 ENCOUNTER — Encounter (HOSPITAL_COMMUNITY): Payer: Self-pay

## 2015-03-09 DIAGNOSIS — R591 Generalized enlarged lymph nodes: Secondary | ICD-10-CM | POA: Insufficient documentation

## 2015-03-09 DIAGNOSIS — R220 Localized swelling, mass and lump, head: Secondary | ICD-10-CM | POA: Diagnosis not present

## 2015-03-09 LAB — PROTIME-INR
INR: 1.01 (ref 0.00–1.49)
Prothrombin Time: 13.5 seconds (ref 11.6–15.2)

## 2015-03-09 LAB — CBC
HEMATOCRIT: 38.7 % — AB (ref 39.0–52.0)
HEMOGLOBIN: 13.3 g/dL (ref 13.0–17.0)
MCH: 32.7 pg (ref 26.0–34.0)
MCHC: 34.4 g/dL (ref 30.0–36.0)
MCV: 95.1 fL (ref 78.0–100.0)
Platelets: 132 10*3/uL — ABNORMAL LOW (ref 150–400)
RBC: 4.07 MIL/uL — AB (ref 4.22–5.81)
RDW: 14.5 % (ref 11.5–15.5)
WBC: 6.3 10*3/uL (ref 4.0–10.5)

## 2015-03-09 LAB — APTT: APTT: 33 s (ref 24–37)

## 2015-03-09 MED ORDER — FENTANYL CITRATE (PF) 100 MCG/2ML IJ SOLN
INTRAMUSCULAR | Status: AC | PRN
  Administered 2015-03-09: 50 ug via INTRAVENOUS

## 2015-03-09 MED ORDER — SODIUM CHLORIDE 0.9 % IV SOLN: Freq: Once | INTRAVENOUS | Status: DC

## 2015-03-09 MED ORDER — FENTANYL CITRATE (PF) 100 MCG/2ML IJ SOLN
INTRAMUSCULAR | Status: AC
  Filled 2015-03-09: qty 2

## 2015-03-09 MED ORDER — MIDAZOLAM HCL 2 MG/2ML IJ SOLN
INTRAMUSCULAR | Status: AC | PRN
  Administered 2015-03-09: 1 mg via INTRAVENOUS

## 2015-03-09 MED ORDER — LIDOCAINE HCL (PF) 1 % IJ SOLN
INTRAMUSCULAR | Status: AC
  Filled 2015-03-09: qty 10

## 2015-03-09 MED ORDER — MIDAZOLAM HCL 2 MG/2ML IJ SOLN
INTRAMUSCULAR | Status: AC
  Filled 2015-03-09: qty 2

## 2015-03-09 NOTE — Procedures (Signed)
Interventional Radiology Procedure Note  Procedure:  Ultrasound guided aspiration of right submandibular collection  Complications:  None  Estimated Blood Loss: < 10 mL  Right submandibular abnormality cystic by [place].  7 mL of milky white fluid aspirated, resulting in complete decompression. Fluid sent for culture and cytology.  Venetia Night. Kathlene Cote, M.D Pager:  [phone]

## 2015-03-09 NOTE — Discharge Instructions (Signed)
Needle Biopsy, Care After °These instructions give you information about caring for yourself after your procedure. Your doctor may also give you more specific instructions. Call your doctor if you have any problems or questions after your procedure. °HOME CARE °· Rest as told by your doctor. °· Take medicines only as told by your doctor. °· There are many different ways to close and cover the biopsy site, including stitches (sutures), skin glue, and adhesive strips. Follow instructions from your doctor about: °¨ How to take care of your biopsy site. °¨ When and how you should change your bandage (dressing). °¨ When you should remove your dressing. °¨ Removing whatever was used to close your biopsy site. °· Check your biopsy site every day for signs of infection. Watch for: °¨ Redness, swelling, or pain. °¨ Fluid, blood, or pus. °GET HELP IF: °· You have a fever. °· You have redness, swelling, or pain at the biopsy site, and it lasts longer than a few days. °· You have fluid, blood, or pus coming from the biopsy site. °· You feel sick to your stomach (nauseous). °· You throw up (vomit). °GET HELP RIGHT AWAY IF: °· You are short of breath. °· You have trouble breathing. °· Your chest hurts. °· You feel dizzy or you pass out (faint). °· You have bleeding that does not stop with pressure or a bandage. °· You cough up blood. °· Your belly (abdomen) hurts. °  °This information is not intended to replace advice given to you by your health care provider. Make sure you discuss any questions you have with your health care provider. °  °Document Released: 03/29/2008 Document Revised: 08/31/2014 Document Reviewed: 04/12/2014 °Elsevier Interactive Patient Education ©2016 [employer] Inc. ° °

## 2015-03-09 NOTE — H&P (Signed)
Chief Complaint: Patient was seen in consultation today for Right cervical lymph node biopsy at the request of [facility]  Referring Physician(s): Wolicki,Karol  History of Present Illness: Dakota Martin is a 74 y.o. male   Pt noticed enlarged LN approx July 2016 Followed with Dr Abner Greenspan and without change was  referred to ENT Dr Erik Obey US Neck: IMPRESSION: 1. Single enlarged right sided lymph node corresponds to the palpable abnormality. It measures 3.8 x 1.7 x 2.1 cm. There are no other enlarged or abnormal appearing lymph nodes. There is no mucosal abnormality or mass to suggest a primary malignancy. 2. No other significant findings.  Now scheduled for bx per Dr Erik Obey Pt off coumadin for Aortic heart valve x 5 days Last dose Lovenox 8pm 11/8 (BID)  Past Medical History  Diagnosis Date  . Hypertension   . Hyperlipidemia   . Gout   . Arthritis   . Colon polyp     Past Surgical History  Procedure Laterality Date  . Aortic valve  1982    prosthetic  val ve  . Tonsillectomy      Allergies: Review of patient's allergies indicates no known allergies.  Medications: Prior to Admission medications   Medication Sig Start Date End Date Taking? Authorizing Provider  allopurinol (ZYLOPRIM) 300 MG tablet Take 300 mg by mouth daily.   Yes Historical Provider, MD  amLODipine (NORVASC) 5 MG tablet Take 5 mg by mouth daily.   Yes Historical Provider, MD  amoxicillin (AMOXIL) 500 MG capsule Take 500 mg by mouth as needed. For dental visits 07/08/13  Yes Historical Provider, MD  atorvastatin (LIPITOR) 10 MG tablet Take 10 mg by mouth daily.   Yes Historical Provider, MD  carvedilol (COREG) 6.25 MG tablet Take 1 tablet (6.25 mg total) by mouth 2 (two) times daily. 08/17/14  Yes Thayer Headings, MD  Cholecalciferol (VITAMIN D-3 PO) Take 1 tablet by mouth daily.    Yes Historical Provider, MD  Coenzyme Q10 (CO Q 10 PO) Take 1 tablet by mouth daily.    Yes Historical  Provider, MD  Cyanocobalamin (VITAMIN B-12) 5000 MCG SUBL Place under the tongue once a week.   Yes Historical Provider, MD  enoxaparin (LOVENOX) 80 MG/0.8ML injection Inject 0.8 mLs into the skin 2 (two) times daily. 03/02/15  Yes Historical Provider, MD  ezetimibe (ZETIA) 10 MG tablet Take 10 mg by mouth daily.   Yes Historical Provider, MD  fish oil-omega-3 fatty acids 1000 MG capsule Take 2 g by mouth daily.   Yes Historical Provider, MD  Fluticasone Propionate (FLONASE NA) Place 1 spray into both nostrils daily as needed (AS NEEDED FOR RHINITIS AND ALLERGIES).    Yes Historical Provider, MD  hydrochlorothiazide (MICROZIDE) 12.5 MG capsule Take 1 capsule (12.5 mg total) by mouth daily. 08/17/14  Yes Thayer Headings, MD  Magnesium 500 MG CAPS Take 500 mg by mouth daily.   Yes Historical Provider, MD  OVER THE COUNTER MEDICATION Take 1 capsule by mouth daily. AYER-TRIPHALA   Yes Historical Provider, MD  potassium chloride (K-DUR) 10 MEQ tablet Take 1 tablet (10 mEq total) by mouth daily. 08/17/14  Yes Thayer Headings, MD  telmisartan (MICARDIS) 80 MG tablet Take 80 mg by mouth daily. 04/12/14  Yes Historical Provider, MD  warfarin (COUMADIN) 5 MG tablet Take 5-7.5 mg by mouth daily. 5 mg all days except Thurs. On Thurs, 7.5 mg   Yes Historical Provider, MD     Family History  Problem Relation Age of Onset  . Hyperlipidemia Mother   . Colon cancer Mother   . Colon polyps Neg Hx   . Diabetes Maternal Grandfather   . Kidney disease Neg Hx   . Esophageal cancer Neg Hx   . Gallbladder disease Neg Hx   . Emphysema Father   . Other Father     respiratory failure    Social History   Social History  . Marital Status: Married    Spouse Name: N/A  . Number of Children: 2  . Years of Education: N/A   Occupational History  . Retired    Social History Main Topics  . Smoking status: Never Smoker   . Smokeless tobacco: Never Used  . Alcohol Use: 0.0 oz/week    0 Standard drinks or  equivalent per week     Comment: 2 drinks a day  . Drug Use: No  . Sexual Activity: Not Asked   Other Topics Concern  . 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  All other systems are negative.  Review of Systems  Constitutional: Negative for fever, activity change, appetite change, fatigue and unexpected weight change.  HENT: Negative for sore throat.   Respiratory: Negative for cough and shortness of breath.   Musculoskeletal: Negative for neck pain and neck stiffness.  Neurological: Negative for weakness.  Psychiatric/Behavioral: Negative for behavioral problems and confusion.    Vital Signs: BP 131/69 mmHg  Pulse 57  Temp(Src) 97.7 F (36.5 C)  Ht 5\' 9"  (1.753 m)  Wt 170 lb (77.111 kg)  BMI 25.09 kg/m2  SpO2 99%  Physical Exam  Constitutional: He is oriented to person, place, and time.  Cardiovascular: Normal rate and regular rhythm.   Murmur heard. + click  Pulmonary/Chest: Effort normal and breath sounds normal. He has no wheezes.  Abdominal: Soft. Bowel sounds are normal. There is no tenderness.  Musculoskeletal: Normal range of motion.  Neurological: He is alert and oriented to person, place, and time.  Skin: Skin is warm and dry.  Psychiatric: He has a normal mood and affect. His behavior is normal. Judgment and thought content normal.  Nursing note and vitals reviewed.   Mallampati Score:  MD Evaluation Airway: WNL Heart: WNL Abdomen: WNL Chest/ Lungs: WNL ASA  Classification: 2 Mallampati/Airway Score: One  Imaging: No results found.  Labs:  CBC:  Recent Labs  03/09/15 1119  WBC 6.3  HGB 13.3  HCT 38.7*  PLT 132*    COAGS:  Recent Labs  07/05/14 1005 07/09/14 0914 07/12/14 0945 03/09/15 1119  INR 4.0* 2.4* 2.9* 1.01  APTT  --    --   --  33    BMP:  Recent Labs  06/22/14 0912 09/09/14 1454 09/09/14 1456 03/04/15 0819  NA 136  --   --  136  K 4.4  --   --  4.5  CL 100  --   --  100  CO2 33*  --   --  27  GLUCOSE 107*  --   --  107*  BUN 22 28*  --  15  CALCIUM 9.6  --   --  9.3  CREATININE 1.09  --  1.30 1.06  LIVER FUNCTION TESTS: No results for input(s): BILITOT, AST, ALT, ALKPHOS, PROT, ALBUMIN in the last 8760 hours.  TUMOR MARKERS: No results for input(s): AFPTM, CEA, CA199, CHROMGRNA in the last 8760 hours.  Assessment and Plan:  Rt neck LAN that is unchanged x 5 months Now scheduled for bx per Dr Erik Obey Risks and Benefits discussed with the patient including, but not limited to bleeding, infection, damage to adjacent structures or low yield requiring additional tests. All of the patient's questions were answered, patient is agreeable to proceed. Consent signed and in chart.   Thank you for this interesting consult.  I greatly enjoyed meeting Dakota Martin and look forward to participating in their care.  A copy of this report was sent to the requesting provider on this date.  Signed: Aybree Lanyon A 03/09/2015, 12:39 PM   I spent a total of  30 Minutes   in face to face in clinical consultation, greater than 50% of which was counseling/coordinating care for Rt cervical lymph node bx

## 2015-03-13 LAB — CULTURE, ROUTINE-ABSCESS: Special Requests: 3.7

## 2015-03-14 LAB — ANAEROBIC CULTURE: Special Requests: 3.7

## 2015-03-22 ENCOUNTER — Ambulatory Visit (INDEPENDENT_AMBULATORY_CARE_PROVIDER_SITE_OTHER): Payer: Medicare Other | Admitting: Thoracic Surgery (Cardiothoracic Vascular Surgery)

## 2015-03-22 ENCOUNTER — Ambulatory Visit
Admission: RE | Admit: 2015-03-22 | Discharge: 2015-03-22 | Disposition: A | Discharge status: Home / Self Care | Payer: Medicare Other | Source: Ambulatory Visit | Attending: Thoracic Surgery (Cardiothoracic Vascular Surgery) | Admitting: Thoracic Surgery (Cardiothoracic Vascular Surgery)

## 2015-03-22 ENCOUNTER — Encounter: Payer: Self-pay | Admitting: Thoracic Surgery (Cardiothoracic Vascular Surgery)

## 2015-03-22 ENCOUNTER — Ambulatory Visit: Payer: Medicare Other | Admitting: Thoracic Surgery (Cardiothoracic Vascular Surgery)

## 2015-03-22 VITALS — BP 143/77 | HR 74 | Resp 16 | Ht 69.0 in | Wt 170.0 lb

## 2015-03-22 DIAGNOSIS — I712 Thoracic aortic aneurysm, without rupture, unspecified: Secondary | ICD-10-CM

## 2015-03-22 MED ORDER — IOPAMIDOL (ISOVUE-370) INJECTION 76%
75.0000 mL | Freq: Once | INTRAVENOUS | Status: AC | PRN
  Administered 2015-03-22: 75 mL via INTRAVENOUS

## 2015-03-22 NOTE — Progress Notes (Signed)
Dakota Martin       ,[place] [address]             [phone]       HPI: Dakota Martin returns today for a scheduled 6 month follow-up visit.  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 Bradly Bienenstock back in 1982. He went to Dr. Acie Fredrickson a year ago for a follow-up visit. An echocardiogram showed a mean gradient across the valve of 15 mmHg. The peak gradient was about 31 mmHg. The aortic root was a little dilated so a CT of the chest was done. It also showed a 5 cm ascending aortic aneurysm. I saw him back at 6 months in May 2016. There was no change in the ascending aorta at that time.  In the interim since his last visit he has been feeling well. He checks his blood pressure on regular basis and his systolic ranges from 123456 to 130. He is not having any chest pain, pressure, tightness. He also denies shortness of breath.  Past Medical History  Diagnosis Date  . Hypertension   . Hyperlipidemia   . Gout   . Arthritis   . Colon polyp       Current Outpatient Prescriptions  Medication Sig Dispense Refill  . allopurinol (ZYLOPRIM) 300 MG tablet Take 300 mg by mouth daily.    [name] Kitchen amLODipine (NORVASC) 5 MG tablet Take 5 mg by mouth daily.    [name] Kitchen amoxicillin (AMOXIL) 500 MG capsule Take 500 mg by mouth as needed. For dental visits    . atorvastatin (LIPITOR) 10 MG tablet Take 10 mg by mouth daily.    . carvedilol (COREG) 6.25 MG tablet Take 1 tablet (6.25 mg total) by mouth 2 (two) times daily. 180 tablet 3  . Cholecalciferol (VITAMIN D-3 PO) Take 1 tablet by mouth daily.     . Coenzyme Q10 (CO Q 10 PO) Take 1 tablet by mouth daily.     . Cyanocobalamin (VITAMIN B-12) 5000 MCG SUBL Place under the tongue once a week.    . ezetimibe (ZETIA) 10 MG tablet Take 10 mg by mouth daily.    . fish oil-omega-3 fatty acids 1000 MG capsule Take 2 g by mouth daily.    . Fluticasone Propionate (FLONASE NA) Place 1 spray into both  nostrils daily as needed (AS NEEDED FOR RHINITIS AND ALLERGIES).     . hydrochlorothiazide (MICROZIDE) 12.5 MG capsule Take 1 capsule (12.5 mg total) by mouth daily. 90 capsule 3  . Magnesium 500 MG CAPS Take 500 mg by mouth daily.    [name] Kitchen OVER THE COUNTER MEDICATION Take 1 capsule by mouth daily. AYER-TRIPHALA    . potassium chloride (K-DUR) 10 MEQ tablet Take 1 tablet (10 mEq total) by mouth daily. 90 tablet 3  . telmisartan (MICARDIS) 80 MG tablet Take 80 mg by mouth daily.  11  . warfarin (COUMADIN) 5 MG tablet Take 5-7.5 mg by mouth daily. 5 mg all days except Thurs. On Thurs, 7.5 mg     No current facility-administered medications for this visit.    Physical Exam BP 143/77 mmHg  Pulse 74  Resp 16  Ht 5\' 9"  (1.753 m)  Wt 170 lb (77.111 kg)  BMI 25.09 kg/m2  SpO41 10% 74 year old male in no acute distress Alert and oriented 3 with no focal neurologic deficits Cardiac regular rate and rhythm with a 2 - 3/6  systolic murmur Pulses intact  Diagnostic Tests: I personally reviewed his CT angiogram. I compared to his previous films. There is been no interval change. He has a 4.9 cm ascending aortic aneurysm and three-vessel coronary calcification.  Impression: 74 year old man with a history of an aortic valve replacement back in 1982. He has a 4.9-5 cm ascending aortic aneurysm that is unchanged over the past year. I think he needs semiannual follow-up, but I think that can be done with a combination of echocardiogram and CT scanning and staggering visits at six-month intervals. He will see Dr. Acie Fredrickson again in the spring.  Plan: Follow-up with Dr. Acie Fredrickson in 6 months  I will see him back in October 2017 for a 1 year follow-up with a CT angiogram of the chest  Melrose Nakayama, MD [facility] [phone]

## 2015-06-22 ENCOUNTER — Other Ambulatory Visit: Payer: Self-pay | Admitting: Cardiovascular Disease

## 2015-08-19 ENCOUNTER — Ambulatory Visit (INDEPENDENT_AMBULATORY_CARE_PROVIDER_SITE_OTHER): Payer: Medicare Other | Admitting: Sports Medicine

## 2015-08-19 ENCOUNTER — Encounter: Payer: Self-pay | Admitting: Sports Medicine

## 2015-08-19 VITALS — BP 117/70 | HR 75 | Ht 69.0 in | Wt 171.0 lb

## 2015-08-19 DIAGNOSIS — M25531 Pain in right wrist: Secondary | ICD-10-CM

## 2015-08-19 DIAGNOSIS — M67431 Ganglion, right wrist: Secondary | ICD-10-CM

## 2015-08-19 NOTE — Progress Notes (Signed)
Patient ID: Dakota Martin, male   DOB: 11-22-1940, 74 y.o.   MRN: [id number] HPI chief complaint: Right wrist cyst  Patient comes in today for follow up of a cyst that has formed over the volar aspect of his right wrist. Seen in September 2016, cyst aspirated under ultrasound guidance. Cyst returned within one week and continues to get bigger. No trauma. Cyst is relatively non-painful. He denies numbness or tingling into his fingers.   Interim medical history reviewed Medications reviewed Allergies reviewed  Review of Systems  No other complaints today.     Objective:  BP 117/70 mmHg  Pulse 75  Ht 5\' 9"  (1.753 m)  Wt 171 lb (77.565 kg)  BMI 25.24 kg/m2  Physical Exam Well-developed, well-nourished. No acute distress. Awake alert and oriented 3.  Right wrist: There is a fluctuant ganglion cyst along the volar aspect of his wrist. It is nontender. It is nonpulsatile. It is nonerythematous. He has full painless wrist range of motion. Good grip strength. Good radial and ulnar pulses.      Assessment & Plan:  Ganglion cyst, right wrist  Discussed risks > benefits today of attempting cyst aspiration again given short term benefit he received last time. Recommend compression sleeve when active and for comfort. Contact information for [facility] provided.  Would appreciate an opinion from Dr. Laurelyn Sickle. I will refer him to [facility] specifically to discuss treatment options. Follow-up with me when necessary.

## 2015-08-19 NOTE — Patient Instructions (Signed)
I an referring you to Dr Gwyndolyn Saxon [place]) Amedeo Plenty at [facility] for ganglion cyst removal

## 2015-09-14 ENCOUNTER — Ambulatory Visit (INDEPENDENT_AMBULATORY_CARE_PROVIDER_SITE_OTHER): Payer: Medicare Other | Admitting: Cardiovascular Disease

## 2015-09-14 ENCOUNTER — Encounter: Payer: Self-pay | Admitting: Cardiovascular Disease

## 2015-09-14 VITALS — BP 140/68 | HR 64 | Ht 69.0 in | Wt 172.8 lb

## 2015-09-14 DIAGNOSIS — I10 Essential (primary) hypertension: Secondary | ICD-10-CM

## 2015-09-14 DIAGNOSIS — Z954 Presence of other heart-valve replacement: Secondary | ICD-10-CM

## 2015-09-14 NOTE — Progress Notes (Signed)
Cardiology Office Note   Date:  09/14/2015   ID:  Dakota Martin, DOB 1941/02/26, MRN [id number]  PCP:  Jerlyn Ly, MD  Cardiologist:   Mertie Moores, MD   Chief Complaint  Patient presents with  . Hypertension    s/p AVR      August 17, 2014:  Dakota Martin is a 75 y.o. male who presents for follow-up of his essential hypertension and mild aortic root dilatation. We had HCTZ and Kdur at his last visit.  BP readings have been well controlled.    Expand All Collapse All     Problem List: 1. Aortic valve replacement ( June 1982, Onnie Boer, MD) [facility] mechanical valve. 2. Chronic coumadin therapy.  3. Hypertension 4. Hyperlipidemia 5. 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 He has been doing well. No CP , no dyspnea.  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   Oct. 27, 2015:  Dakota Martin is seen back for a scheduled visit. He had a recent echo that revealed a dilated aortic root ( 50 cm)     Jan. 19, 2016:  Dakota Martin  is seen back today for follow-up of his aortic valve replacement and history of hypertension. He has a dilated aortic root. Also has a history of hypertension and hyperlipidemia. He has been recording his BP daily. He has substituted generic Micardis ( Telmisartan)  for his Benicar  August 17, 2014:    Nov. 4, 2016:  Dakota Martin is seen back today for follow-up visit for his aortic valve replacement and aortic aneurysm. Doing great.  Feeling well.  Has seen Dr. Roxan Hockey - the ascending aortic aneurism seems stable .  Playing tennis regularly  - plays 2-3 times a week .  Going for a right neck lymph node bx.  Bridging with Lovenox.  ( Managed by Medical Resource Manager )   Sep 14, 2015:  Doing well. Playing tennis . Lots of yard work . Had a lymph node bx last  Nov.   Was benign.   Was cystic..  Drained it but it has returned.    Past Medical History  Diagnosis Date  . Hypertension   . Hyperlipidemia   . Gout   . Arthritis   . Colon polyp     Past Surgical History  Procedure Laterality Date  . Aortic valve  1982    prosthetic  val ve  . Tonsillectomy       Current Outpatient Prescriptions  Medication Sig Dispense Refill  . allopurinol (ZYLOPRIM) 300 MG tablet Take 300 mg by mouth daily.    [name] Kitchen amLODipine (NORVASC) 5 MG tablet Take 5 mg by mouth daily.    [name] Kitchen amoxicillin (AMOXIL) 500 MG capsule Take 500 mg by mouth as needed. For dental visits    . atorvastatin (LIPITOR) 10 MG tablet Take 10 mg by mouth daily.    . carvedilol (COREG) 6.25 MG tablet Take 1 tablet by mouth two  times daily 180 tablet 1  . Cholecalciferol (VITAMIN D-3 PO) Take 1 tablet by mouth daily.     . Coenzyme Q10 (CO Q 10 PO) Take 1 tablet by mouth daily.     . Cyanocobalamin (VITAMIN B-12) 5000 MCG SUBL Place under the tongue once a week.    [name] Kitchen  ezetimibe (ZETIA) 10 MG tablet Take 10 mg by mouth daily.    . fish oil-omega-3 fatty acids 1000 MG capsule Take 2 g by mouth daily.    . Fluticasone Propionate (FLONASE NA) Place 1 spray into both nostrils daily as needed (AS NEEDED FOR RHINITIS AND ALLERGIES).     . hydrochlorothiazide (MICROZIDE) 12.5 MG capsule Take 1 capsule by mouth  daily 90 capsule 1  . Magnesium 500 MG CAPS Take 500 mg by mouth daily.    [name] Kitchen OVER THE COUNTER MEDICATION Take 1 capsule by mouth daily. AYER-TRIPHALA    . potassium chloride (K-DUR,KLOR-CON) 10 MEQ tablet Take 1 tablet by mouth  daily 90 tablet 1  . telmisartan (MICARDIS) 80 MG tablet Take 80 mg by mouth daily.  11  . warfarin (COUMADIN) 5 MG tablet Take 5 mg by mouth daily. Take 3 days a week     No current facility-administered medications for this visit.    Allergies:   Review of patient's allergies indicates no known allergies.    Social History:  The patient  reports that he has  never smoked. He has never used smokeless tobacco. He reports that he drinks alcohol. He reports that he does not use illicit drugs.   Family History:  The patient's family history includes Colon cancer in his mother; Diabetes in his maternal grandfather; Emphysema in his father; Hyperlipidemia in his mother; Other in his father. There is no history of Colon polyps, Kidney disease, Esophageal cancer, or Gallbladder disease.    ROS:  Please see the history of present illness.   Otherwise, review of systems are positive for none.   All other systems are reviewed and negative.    PHYSICAL EXAM: VS:  BP 140/68 mmHg  Pulse 64  Ht 5\' 9"  (1.753 m)  Wt 172 lb 12.8 oz (78.382 kg)  BMI 25.51 kg/m2 , BMI Body mass index is 25.51 kg/(m^2). GEN: Well nourished, well developed, in no acute distress HEENT: normal Neck: no JVD, carotid bruits, or masses Cardiac: RRR normal S1, mechanical S2. Respiratory:  clear to auscultation bilaterally, normal work of breathing GI: soft, nontender, nondistended, + BS MS: no deformity or atrophy Skin: warm and dry, no rash Neuro:  Strength and sensation are intact Psych: euthymic mood, full affect   EKG:  EKG is not ordered today.   Recent Labs: 03/04/2015: BUN 15; Creat 1.06; Potassium 4.5; Sodium 136 03/09/2015: Hemoglobin 13.3; Platelets 132*    Lipid Panel No results found for: CHOL, TRIG, HDL, CHOLHDL, VLDL, LDLCALC, LDLDIRECT    Wt Readings from Last 3 Encounters:  09/14/15 172 lb 12.8 oz (78.382 kg)  08/19/15 171 lb (77.565 kg)  03/22/15 170 lb (77.111 kg)      Other studies Reviewed:    ASSESSMENT AND PLAN:  1.  Essential Hypertension:  BP looks great.  Continue current meds  Bp has been great at home.   2. Aortic valve replacement:   Remains on coumadin.  His valve sounds normal. He does have a mildly dilated aortic root which is being followed by Dr. Roxan Hockey.   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 His INR levels are followed through his primary medical doctor.  4. Hyperlipidemia: Continue Lipitor. He's followed by his primary medical doctor.  Current medicines are reviewed at length with the patient today.  The patient does not have concerns regarding medicines.  Labs/ tests ordered today include:  No orders of the defined types were placed  in this encounter.     Disposition:   FU with me  in 7   months.    Mertie Moores, MD  09/14/2015 10:18 AM    [address], [place], [place]  [address] Phone: [phone]; Fax: [phone]

## 2015-09-14 NOTE — Patient Instructions (Signed)

## 2015-10-27 ENCOUNTER — Other Ambulatory Visit: Payer: Self-pay | Admitting: Cardiovascular Disease

## 2016-01-19 ENCOUNTER — Other Ambulatory Visit: Payer: Self-pay | Admitting: Thoracic Surgery (Cardiothoracic Vascular Surgery)

## 2016-01-19 DIAGNOSIS — I712 Thoracic aortic aneurysm, without rupture: Secondary | ICD-10-CM

## 2016-01-19 DIAGNOSIS — I7121 Aneurysm of the ascending aorta, without rupture: Secondary | ICD-10-CM

## 2016-02-03 ENCOUNTER — Other Ambulatory Visit: Payer: Self-pay | Admitting: Otolaryngology

## 2016-02-03 DIAGNOSIS — R221 Localized swelling, mass and lump, neck: Secondary | ICD-10-CM

## 2016-02-14 ENCOUNTER — Ambulatory Visit
Admission: RE | Admit: 2016-02-14 | Discharge: 2016-02-14 | Disposition: A | Discharge status: Home / Self Care | Payer: Medicare Other | Source: Ambulatory Visit | Attending: Otolaryngology | Admitting: Otolaryngology

## 2016-02-14 ENCOUNTER — Ambulatory Visit (INDEPENDENT_AMBULATORY_CARE_PROVIDER_SITE_OTHER): Payer: Medicare Other | Admitting: Thoracic Surgery (Cardiothoracic Vascular Surgery)

## 2016-02-14 ENCOUNTER — Encounter: Payer: Self-pay | Admitting: Thoracic Surgery (Cardiothoracic Vascular Surgery)

## 2016-02-14 ENCOUNTER — Ambulatory Visit
Admission: RE | Admit: 2016-02-14 | Discharge: 2016-02-14 | Disposition: A | Discharge status: Home / Self Care | Payer: Medicare Other | Source: Ambulatory Visit | Attending: Thoracic Surgery (Cardiothoracic Vascular Surgery) | Admitting: Thoracic Surgery (Cardiothoracic Vascular Surgery)

## 2016-02-14 VITALS — BP 126/74 | HR 75 | Resp 16 | Ht 69.0 in | Wt 170.0 lb

## 2016-02-14 DIAGNOSIS — I7121 Aneurysm of the ascending aorta, without rupture: Secondary | ICD-10-CM

## 2016-02-14 DIAGNOSIS — I712 Thoracic aortic aneurysm, without rupture, unspecified: Secondary | ICD-10-CM

## 2016-02-14 DIAGNOSIS — Z952 Presence of prosthetic heart valve: Secondary | ICD-10-CM

## 2016-02-14 DIAGNOSIS — R221 Localized swelling, mass and lump, neck: Secondary | ICD-10-CM

## 2016-02-14 HISTORY — DX: Thoracic aortic aneurysm, without rupture: I71.2

## 2016-02-14 HISTORY — DX: Aneurysm of the ascending aorta, without rupture: I71.21

## 2016-02-14 MED ORDER — IOPAMIDOL (ISOVUE-370) INJECTION 76%
75.0000 mL | Freq: Once | INTRAVENOUS | Status: AC | PRN
  Administered 2016-02-14: 75 mL via INTRAVENOUS

## 2016-02-14 NOTE — Progress Notes (Signed)
[address]       [place] [address]             [phone]     HPI: Mr. 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 In 2015 he had an echocardiogram which showed the mean gradient of the valve to be 15 mmHg and a peak gradient of 31 mmHg. His ascending aorta was noted to be enlarged so a CT of the chest was done. 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 I saw him in May 2016 and again in November 2016. The aneurysm was unchanged and he was doing well.  He saw Dr. Acie Fredrickson in May. He was doing well at that time and he did not feel that an echocardiogram was indicated.  Since then he's continued to heal well. He remains very active. He is not having any chest pain, pressure, or tightness. He's not having any usual shortness of breath. He denies orthopnea and peripheral edema.  Past Medical History:  Diagnosis Date  . Arthritis   . Colon polyp   . Gout   . Hyperlipidemia   . Hypertension       Current Outpatient Prescriptions  Medication Sig Dispense Refill  . allopurinol (ZYLOPRIM) 300 MG tablet Take 300 mg by mouth daily.    [name] Kitchen amLODipine (NORVASC) 5 MG tablet Take 5 mg by mouth daily.    [name] Kitchen amoxicillin (AMOXIL) 500 MG capsule Take 500 mg by mouth as needed. For dental visits    . atorvastatin (LIPITOR) 10 MG tablet Take 10 mg by mouth daily.    . carvedilol (COREG) 6.25 MG tablet Take 1 tablet by mouth two  times daily 180 tablet 3  . Cholecalciferol (VITAMIN D-3 PO) Take 1,000 Int'l Units by mouth daily.     . Coenzyme Q10 (CO Q 10 PO) Take 1 tablet by mouth daily.     . Cyanocobalamin (VITAMIN B-12) 5000 MCG SUBL Place under the tongue once a week.    . ezetimibe (ZETIA) 10 MG tablet Take 10 mg by mouth daily.    . fish oil-omega-3 fatty acids 1000 MG capsule Take 2 g by mouth daily.    . Fluticasone Propionate (FLONASE NA) Place 1 spray  into both nostrils daily as needed (AS NEEDED FOR RHINITIS AND ALLERGIES).     . hydrochlorothiazide (MICROZIDE) 12.5 MG capsule Take 1 capsule by mouth  daily 90 capsule 3  . Magnesium 500 MG CAPS Take 500 mg by mouth daily.    [name] Kitchen OVER THE COUNTER MEDICATION Take 1 capsule by mouth daily. AYER-TRIPHALA    . potassium chloride (K-DUR,KLOR-CON) 10 MEQ tablet Take 1 tablet by mouth  daily 90 tablet 3  . telmisartan (MICARDIS) 80 MG tablet Take 80 mg by mouth daily.  11  . warfarin (COUMADIN) 5 MG tablet Take 5 mg by mouth daily. Take 3 days a week OR AS DIRECTED     No current facility-administered medications for this visit.     Physical Exam BP 126/74   Pulse 75   Resp 16   Ht 5\' 9"  (1.753 m)   Wt 170 lb (77.1 kg)   SpO2 98% Comment: ON RA  BMI 25.71 kg/m  75 year old man in no acute distress Alert and oriented 3 with no focal deficits Cardiac regular rate and rhythm with a 3/6 systolic  murmur Lungs clear with equal breath says bilaterally 2+ pulses bilaterally  Diagnostic Tests: CT ANGIOGRAPHY CHEST WITH CONTRAST  TECHNIQUE: Multidetector CT imaging of the chest was performed using the standard protocol during bolus administration of intravenous contrast. Multiplanar CT image reconstructions and MIPs were obtained to evaluate the vascular anatomy.  CONTRAST:  75 mL Isovue 370.  Creatinine was obtained on site at [facility] at [address]Results: Creatinine 1.4 mg/dL. GFR=49  COMPARISON:  03/22/2015  FINDINGS: Cardiovascular: Calcific changes are again noted in the thoracic aorta. Aneurysmal dilatation of 5 cm is noted. This is slightly larger than that seen on the prior exam (1 mm). Tapering of the distal descending thoracic aorta is noted similar to that seen on the prior exam. Aortic valve replacement is again noted and stable. Measured at sino-tubular junction is approximately 3.5 cm. The origins of the brachiocephalic vessels are patent.  Only minimal stenosis at the origin of the left subclavian artery is seen.  The pulmonary artery as visualized is within normal limits. Coronary calcifications are seen and stable.  Mediastinum/Nodes: The thoracic inlet is within normal limits. No hilar or mediastinal adenopathy is noted. A few small calcified lymph nodes are noted within the hila consistent with prior granulomatous disease. No axillary adenopathy is seen.  Lungs/Pleura: Lungs are well aerated bilaterally. No focal infiltrate or sizable effusion is noted. A few small calcified granulomas are noted.  Upper Abdomen: Hepatic cysts are again seen and stable. Scattered splenic granulomas are noted. Remainder the upper abdomen is within normal limits.  Musculoskeletal: Degenerative change of the thoracic spine is again noted. Bilateral shoulder degenerative changes are noted right greater than left.  Review of the MIP images confirms the above findings.  IMPRESSION: Relatively stable aneurysmal dilatation of the ascending aorta to 5 cm. This represents approximately 1 mm of growth over the past year.  Changes of prior granulomatous disease.  Stable hepatic cysts.  No acute abnormality is noted.   Electronically Signed   By: Inez Catalina M.D.   On: 02/14/2016 14:35 I personally reviewed the CT and concur with lyses noted above. Unclear with is really been a millimeter of growth or whether this is just within the margin of error for measurement.  Impression: Mr. Oetken is a 75 year old man who had a Bjork Shiley aortic valve replacement by Dr. Truman Hayward back in 1982. He was found to have a 4.9-5 cm ascending aortic aneurysm about 2 years ago and it has been essentially unchanged during that period of time. The typical size for recommending surgery in this area would be 5.5 cm or if there was more than 5 mm of growth over 6 month time frame. There currently is no indication for surgery. He does need continued  close follow-up. As he may not be getting echocardiograms on a regular basis we'll plan to do a CT without contrast every 6 months. As long as it remains relatively stable at all thank contrast as necessary. If there is concern for growth we can always repeat with contrast if needed.  Blood pressure remains well controlled.  Plan: Return in 6 months with CT of chest  Melrose Nakayama, MD [facility] [phone]

## 2016-07-10 ENCOUNTER — Other Ambulatory Visit: Payer: Self-pay | Admitting: Thoracic Surgery (Cardiothoracic Vascular Surgery)

## 2016-07-10 DIAGNOSIS — I7121 Aneurysm of the ascending aorta, without rupture: Secondary | ICD-10-CM

## 2016-07-10 DIAGNOSIS — I712 Thoracic aortic aneurysm, without rupture: Secondary | ICD-10-CM

## 2016-08-21 ENCOUNTER — Other Ambulatory Visit: Payer: Medicare Other

## 2016-08-21 ENCOUNTER — Ambulatory Visit: Payer: Medicare Other | Admitting: Thoracic Surgery (Cardiothoracic Vascular Surgery)

## 2016-08-28 ENCOUNTER — Ambulatory Visit (INDEPENDENT_AMBULATORY_CARE_PROVIDER_SITE_OTHER): Payer: Medicare Other | Admitting: Thoracic Surgery (Cardiothoracic Vascular Surgery)

## 2016-08-28 ENCOUNTER — Encounter: Payer: Self-pay | Admitting: Thoracic Surgery (Cardiothoracic Vascular Surgery)

## 2016-08-28 ENCOUNTER — Ambulatory Visit
Admission: RE | Admit: 2016-08-28 | Discharge: 2016-08-28 | Disposition: A | Discharge status: Home / Self Care | Payer: Medicare Other | Source: Ambulatory Visit | Attending: Thoracic Surgery (Cardiothoracic Vascular Surgery) | Admitting: Thoracic Surgery (Cardiothoracic Vascular Surgery)

## 2016-08-28 VITALS — BP 125/66 | HR 77 | Resp 16 | Ht 69.0 in | Wt 169.0 lb

## 2016-08-28 DIAGNOSIS — I712 Thoracic aortic aneurysm, without rupture: Secondary | ICD-10-CM

## 2016-08-28 DIAGNOSIS — I7121 Aneurysm of the ascending aorta, without rupture: Secondary | ICD-10-CM

## 2016-08-28 NOTE — Progress Notes (Signed)
[address]       [place] [id number]             [phone]    HPI: Mr. 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  He is a 76 year old man who had an aortic valve replacement with Bjork Shiley valve in 1982. In 2015 he had a cardiac echocardiogram which showed the valve to have a mean gradient of 15 mmHg a peak gradient 31 mmHg. His ascending aorta was noted to be enlarged so a CT was done. 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 I have been following him every 6 months since then. I last saw him in October. At that time he was doing well and the aneurysm was stable.  In the interim since his last visit he's been feeling well. He remains active playing tennis on a regular basis. He has not had any issues with Coumadin. He denies chest pain, pressure, tightness, shortness of breath, peripheral edema, orthopnea, paroxysmal nocturnal dyspnea.  Past Medical History:  Diagnosis Date  . Arthritis   . Colon polyp   . Gout   . Hyperlipidemia   . Hypertension    Past Surgical History:  Procedure Laterality Date  . Aortic valve  1982   prosthetic  val ve  . TONSILLECTOMY      Current Outpatient Prescriptions  Medication Sig Dispense Refill  . allopurinol (ZYLOPRIM) 300 MG tablet Take 300 mg by mouth daily.    [name] Kitchen amLODipine (NORVASC) 5 MG tablet Take 5 mg by mouth daily.    [name] Kitchen amoxicillin (AMOXIL) 500 MG capsule Take 2,000 mg by mouth as needed. For dental visits     . atorvastatin (LIPITOR) 10 MG tablet Take 10 mg by mouth daily.    . carvedilol (COREG) 6.25 MG tablet Take 1 tablet by mouth two  times daily 180 tablet 3  . Cholecalciferol (VITAMIN D-3 PO) Take 1,000 Int'l Units by mouth daily.     . Coenzyme Q10 (CO Q 10 PO) Take 1 tablet by mouth daily.     . Cyanocobalamin (VITAMIN B-12) 5000 MCG SUBL Place under the tongue once a week.    . ezetimibe (ZETIA) 10 MG tablet Take 10 mg by mouth daily.    . fish oil-omega-3 fatty acids 1000 MG  capsule Take 2 g by mouth daily.    . hydrochlorothiazide (MICROZIDE) 12.5 MG capsule Take 1 capsule by mouth  daily 90 capsule 3  . Magnesium 500 MG CAPS Take 500 mg by mouth daily.    [name] Kitchen OVER THE COUNTER MEDICATION Take 1 capsule by mouth daily. AYER-TRIPHALA    . potassium chloride (K-DUR,KLOR-CON) 10 MEQ tablet Take 1 tablet by mouth  daily 90 tablet 3  . telmisartan (MICARDIS) 80 MG tablet Take 80 mg by mouth daily.  11  . warfarin (COUMADIN) 5 MG tablet Take 5 mg by mouth daily. Take 4 days a week and 7.5 mg 3days/wk OR AS DIRECTED     No current facility-administered medications for this visit.     Physical Exam BP 125/66 (BP Location: Left Arm, Patient Position: Sitting, Cuff Size: Large)   Pulse 77   Resp 16   Ht 5\' 9"  (1.753 m)   Wt 169 lb (76.7 kg)   SpO2 98% Comment: RA  BMI 24.79 kg/m  76 year old man in no acute distress Well-developed well-nourished Alert and oriented 3 with no focal deficits Cardiac regular rate and rhythm with a 3/6  systolic murmur loudest at right upper sternal border murmur heard throughout precordium. Lungs clear with equal breath sounds bilaterally Pulses intact No peripheral edema  Diagnostic Tests: CT CHEST WITHOUT CONTRAST  TECHNIQUE: Multidetector CT imaging of the chest was performed following the standard protocol without IV contrast.  COMPARISON:  02/14/2016  FINDINGS: Cardiovascular: Limited assessment of the vasculature, study is without contrast. Re- measuring at similar levels, the ascending thoracic aortic aneurysm has a maximal AP dimension of 5.5 cm, previously 5.1 cm and transverse dimension 5.3 cm, previously 5.1 cm. This represents very minimal increase compared to 02/14/2016. Three-vessel arch anatomy. Atherosclerosis present of the thoracic aorta. Remote aortic valve replacement creates artifact. Normal heart size. No pericardial effusion.  Mediastinum/Nodes: No enlarged mediastinal or axillary lymph  nodes. Thyroid gland, trachea, and esophagus demonstrate no significant findings.  Lungs/Pleura: Small punctate subpleural calcified granuloma measuring 3 mm in right lower lobe superior segment, image 66 series 4. Minor dependent bibasilar atelectasis and scarring. Additional subcentimeter calcified granulomas in the right lower lobe inferiorly. No focal pneumonia, collapse or consolidation. Negative for edema, effusion, pleural abnormality, or pneumothorax.  Upper Abdomen: Numerous scattered hepatic hypodense cysts as before. No biliary dilatation. Punctate splenic calcified granulomata. Abdominal aortic atherosclerosis noted.  Musculoskeletal: Remote median sternotomy noted. Multilevel thoracic spondylosis. No acute compression fracture.  IMPRESSION: Minimal increase in diameter of the ascending thoracic aortic aneurysm, maximal dimension 5.5 cm, previously 5.1 cm.  Thoracic and abdominal aortic atherosclerosis  Remote granulomatous disease  Numerous hepatic renal cysts  No other acute intrathoracic abnormality.   Electronically Signed   By: Jerilynn Mages.  Shick M.D.   On: 08/28/2016 15:04 I personally reviewed the CT chest her with findings noted above. His aneurysm is approaching 5.5 cm although I got slightly less than that on my personal measurements.  Impression: Mr. 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 I have been following him for an ascending aneurysm. On his CT today the aneurysm has increased in size relative to his most recent CT. It now is a proximally 5.45.5 centimeters in diameter.  I had a long discussion with Mr. Colpitts regarding the growth of the aneurysm. In the ascending aorta which if we will 5.5- 6 cm as being the range which surgery is indicated. The other indication is growth of 5 mm or more in 6 months. He is just minimally below the criteria on both of those, but given  the recent growth, I think he needs to strongly consider going ahead with surgery. He is very reluctant to do so.  I described the general nature of the operation to him including use of axillary cannulation and femoral venous cannulation. He then would need a redo sternotomy replace the ascending aorta. I would strongly recommend replacing the aortic valve at the same setting given the unknown history of issues with [facility] valves and the length of time and has been implanted. One consideration would be to replacing it with a tissue valve to avoid the need for ongoing anticoagulation. He has not had any issues related to anticoagulation and therefore is very reluctant to consider that.   He has not seen Dr. Acie Fredrickson recently and has not had an echocardiogram in about 3 years.  Plan: Needs a follow-up appointment with Dr. Acie Fredrickson 2-D echocardiogram to evaluate aortic valve If he decides not to pursue surgery at this point will need a repeat CT in 6 months  Remo Lipps  Chaya Jan, MD [facility] [phone]

## 2016-08-29 ENCOUNTER — Telehealth: Payer: Self-pay | Admitting: Cardiovascular Disease

## 2016-08-29 DIAGNOSIS — Z952 Presence of prosthetic heart valve: Secondary | ICD-10-CM

## 2016-08-29 NOTE — Telephone Encounter (Signed)
New message   Pt states that he is supposed to have an Echo this month or next month. I advised him that I did not see an order for it and let him know that someone would f/u with him about it.

## 2016-08-29 NOTE — Telephone Encounter (Signed)
Discussed appointment dates and times with patient. He states echo is needed for Dr. Roxan Hockey to determine timing of aortic aneurysm repair. He prefers a sooner appointment for echo and with Dr. Acie Fredrickson. I advised I will check periodically for cancellations and will call him back if any occur. He verbalized understanding and agreement and thanked me for the call.

## 2016-08-29 NOTE — Telephone Encounter (Signed)
Spoke with patient and advised that Dr. Acie Fredrickson is in agreement to order an echo. I offered to schedule his echo appointment and he verbalized agreement, states he is in a store right now and asked me to call him back in a few minutes.

## 2016-09-06 NOTE — Telephone Encounter (Signed)
Called patient to offer echo appointment for today at 11:30. He states he is unable to take this appointment and will keep appointment for Monday. He thanked me for the call.

## 2016-09-10 ENCOUNTER — Ambulatory Visit (HOSPITAL_COMMUNITY): Payer: Medicare Other | Attending: Cardiology

## 2016-09-10 ENCOUNTER — Other Ambulatory Visit: Payer: Self-pay

## 2016-09-10 DIAGNOSIS — I071 Rheumatic tricuspid insufficiency: Secondary | ICD-10-CM | POA: Insufficient documentation

## 2016-09-10 DIAGNOSIS — Z952 Presence of prosthetic heart valve: Secondary | ICD-10-CM | POA: Diagnosis not present

## 2016-09-11 ENCOUNTER — Encounter: Payer: Self-pay | Admitting: Physician Assistant

## 2016-09-12 ENCOUNTER — Encounter: Payer: Self-pay | Admitting: Physician Assistant

## 2016-09-12 ENCOUNTER — Ambulatory Visit (INDEPENDENT_AMBULATORY_CARE_PROVIDER_SITE_OTHER): Payer: Medicare Other | Admitting: Physician Assistant

## 2016-09-12 VITALS — BP 90/52 | HR 63 | Ht 69.0 in | Wt 173.4 lb

## 2016-09-12 DIAGNOSIS — E785 Hyperlipidemia, unspecified: Secondary | ICD-10-CM

## 2016-09-12 DIAGNOSIS — I10 Essential (primary) hypertension: Secondary | ICD-10-CM | POA: Diagnosis not present

## 2016-09-12 DIAGNOSIS — I7121 Aneurysm of the ascending aorta, without rupture: Secondary | ICD-10-CM

## 2016-09-12 DIAGNOSIS — Z954 Presence of other heart-valve replacement: Secondary | ICD-10-CM | POA: Diagnosis not present

## 2016-09-12 DIAGNOSIS — I712 Thoracic aortic aneurysm, without rupture: Secondary | ICD-10-CM

## 2016-09-12 NOTE — Progress Notes (Signed)
Cardiology Office Note:    Date:  09/12/2016   ID:  Dakota Martin, DOB 07/24/1940, MRN [id number]  PCP:  Crist Infante, MD  Cardiologist:  Dr. Liam Rogers     Referring MD: Crist Infante, MD   Chief Complaint  Patient presents with  . s/p AVR - follow up    History of Present Illness:    Dakota Martin is a 76 y.o. male with a hx of mechanical AVR (Bjork-Shiley) in 1982, dilated aortic root/ascending aortic aneurysm, HTN, HL.  Last seen by Dr. Liam Rogers in 5/17.  He is followed by Dr. Roxan Hockey for his aneurysm.  He was last seen in 5/18.  His aneurysm had reached a diameter that surgery was recommended (aneurysm repair along with redo AVR).  The patient was reluctant.  A follow up echocardiogram was recommended along with follow up with Dr. Liam Rogers.    He returns for follow up.  He is here alone.  We reviewed the findings of his recent echo.  The gradients across the AVR are minimally higher but overall stable.  His ejection fraction remains normal.  He denies chest pain, shortness of breath, syncope, orthopnea, PND or significant pedal edema.    Prior CV studies:   The following studies were reviewed today:  Chest CT 08/28/16 Ascending thoracic aortic aneurysm 5.5 cm Thoracic and abdominal aortic atherosclerosis Remote granulomatous disease  Echo 09/10/16 Mild focal basal septal hypertrophy, [name], [name], [name], mod aortic root enlargement, aortic root 36 mm, ascending aorta 49 mm, [name], [name]  Past Medical History:  Diagnosis Date  . Arthritis   . Ascending aortic aneurysm ([place]) 02/14/2016   5 cm in 2015 // Chest CT 08/28/16:  Ascending thoracic aortic aneurysm 5.5 cm, Thoracic and abdominal aortic atherosclerosis, Remote granulomatous disease  . Colon polyp   . Gout   . Hyperlipidemia   . Hypertension   . S/P aortic valve replacement with metallic valve 47/12/6281   Due to congenital aortic  stenosis // [name] mechanical valve done in 1982 // Echo 09/10/16: Mild focal basal septal hypertrophy, [name], [name], [name], mod aortic root enlargement, aortic root 36 mm, ascending aorta 49 mm, [name], [name]    Past Surgical History:  Procedure Laterality Date  . Aortic valve  1982   prosthetic  val ve  . TONSILLECTOMY      Current Medications: Current Meds  Medication Sig  . allopurinol (ZYLOPRIM) 300 MG tablet Take 300 mg by mouth daily.  [name] Kitchen amLODipine (NORVASC) 5 MG tablet Take 5 mg by mouth daily.  [name] Kitchen amoxicillin (AMOXIL) 500 MG capsule Take 2,000 mg by mouth as needed. For dental visits   . atorvastatin (LIPITOR) 10 MG tablet Take 10 mg by mouth daily.  . carvedilol (COREG) 6.25 MG tablet Take 1 tablet by mouth two  times daily  . Cholecalciferol (VITAMIN D-3 PO) Take 1,000 Int'l Units by mouth daily.   . Coenzyme Q10 (CO Q 10 PO) Take 1 tablet by mouth daily.   . Cyanocobalamin (VITAMIN B-12) 5000 MCG SUBL Place under the tongue once a week.  . ezetimibe (ZETIA) 10 MG tablet Take 1/2 TABLET BY MOUTH (5 MG TOTAL) ONCE DAILY  . fish oil-omega-3 fatty acids 1000 MG capsule Take 2 g by mouth daily.  . hydrochlorothiazide (MICROZIDE) 12.5 MG capsule Take 1 capsule by mouth  daily  . Magnesium 500 MG CAPS  Take 500 mg by mouth daily.  [name] Kitchen OVER THE COUNTER MEDICATION Take 1 capsule by mouth daily. AYER-TRIPHALA  . potassium chloride (K-DUR,KLOR-CON) 10 MEQ tablet Take 1 tablet by mouth  daily  . telmisartan (MICARDIS) 80 MG tablet Take 80 mg by mouth daily.  [name] Kitchen warfarin (COUMADIN) 5 MG tablet Take 5 mg by mouth daily. Take 4 days a week and 7.5 mg 3days/wk OR AS DIRECTED     Allergies:   Patient has no known allergies.   Social History   Social History  . Marital status: Married    Spouse name: N/A  . Number of children: 2  . Years of education: N/A   Occupational History  . Retired    Social History Main Topics  .  Smoking status: Never Smoker  . Smokeless tobacco: Never Used  . Alcohol use 0.0 oz/week     Comment: 2 drinks a day  . Drug use: No  . Sexual activity: Not Asked   Other Topics Concern  . 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 There is no history of Colon polyps, Kidney disease, Esophageal cancer, or Gallbladder disease.  ROS:   Please see the history of present illness.    ROS All other systems reviewed and are negative.   EKGs/Labs/Other Test Reviewed:    EKG:  EKG is  ordered today.  The ekg ordered today demonstrates NSR, HR 63, 1st degree AVB, NSSTTW changes, QTc 444 ms  Recent Labs: No results found for requested labs within last 8760 hours.   Recent Lipid Panel No results found for: CHOL, TRIG, HDL, CHOLHDL, VLDL, LDLCALC, LDLDIRECT   Physical Exam:    VS:  BP (!) 90/52   Pulse 63   Ht _0  (1.753 m)   Wt 173 lb 6.4 oz (78.7 kg)   BMI 25.61 kg/m     Wt Readings from Last 3 Encounters:  09/12/16 173 lb 6.4 oz (78.7 kg)  08/28/16 169 lb (76.7 kg)  02/14/16 170 lb (77.1 kg)     Physical Exam  Constitutional: He is oriented to person, place, and time. He appears well-developed and well-nourished. No distress.  HENT:  Head: Normocephalic and atraumatic.  Eyes: No scleral icterus.  Neck: Normal range of motion. No JVD present.  Cardiovascular: Normal rate, regular rhythm, S1 normal and S2 normal.   No murmur heard. Normal S1; mechanical S2  Pulmonary/Chest: Effort normal and breath sounds normal. He has no wheezes. He has no rhonchi. He has no rales.  Abdominal: Soft. There is no tenderness.  Musculoskeletal: He exhibits no edema.  Neurological: He is alert and  oriented to person, place, and time.  Skin: Skin is warm and dry.  Psychiatric: He has a normal mood and affect.    ASSESSMENT:    1. S/P aortic valve replacement with metallic valve   2. Ascending aortic aneurysm ([place])   3. Essential hypertension   4. Hyperlipidemia, unspecified hyperlipidemia type    PLAN:    In order of problems listed above:  1. S/P aortic valve replacement with metallic valve -  As noted, we reviewed his recent echo.  His gradients are fairly stable.  He had a lot of questions regarding redo AVR with a bioprosthetic valve.  We discussed how this would obviate the need to remain on Coumadin.  His Coumadin is managed by his PCP.  Continue SBE prophylaxis.   2. Ascending aortic aneurysm ([facility]) - Size now 5.5 cm.  He will follow up with Dr. Roxan Hockey regarding timing of aneurysm repair.  He will need a cardiac cath before his aneurysm repair.  He will call us when he is ready to schedule this test.   3. Essential hypertension - BP is low today but he has no symptoms.  I have asked him to monitor this and contact us if it remains low.  4. Hyperlipidemia, unspecified hyperlipidemia type - Continue Zetia.   Dispo:  Return in about 6 months (around 03/15/2017) for Routine Follow Up, w/ Dr. Acie Fredrickson.   Medication Adjustments/Labs and Tests Ordered: Current medicines are reviewed at length with the patient today.  Concerns regarding medicines are outlined above.  Orders/Tests:  Orders Placed This Encounter  Procedures  . EKG 12-Lead   Medication changes: No orders of the defined types were placed in this encounter.  Signed, Richardson Dopp, PA-C  09/12/2016 2:35 PM    [facility] [place], [place], [place]  [address] Phone: [phone]; Fax: [phone]

## 2016-09-12 NOTE — Patient Instructions (Signed)
Medication Instructions:  Your physician recommends that you continue on your current medications as directed. Please refer to the Current Medication list given to you today.   Labwork: NONE ORDERED   Testing/Procedures: NONE ORDERED  Follow-Up: Your physician wants you to follow-up in: [place]. Acie Fredrickson. You will receive a reminder letter in the mail two months in advance. If you don't receive a letter, please call our office to schedule the follow-up appointment.   Any Other Special Instructions Will Be Listed Below (If Applicable).     If you need a refill on your cardiac medications before your next appointment, please call your pharmacy.

## 2016-10-02 ENCOUNTER — Encounter: Payer: Self-pay | Admitting: Thoracic Surgery (Cardiothoracic Vascular Surgery)

## 2016-10-02 ENCOUNTER — Ambulatory Visit (INDEPENDENT_AMBULATORY_CARE_PROVIDER_SITE_OTHER): Payer: Medicare Other | Admitting: Thoracic Surgery (Cardiothoracic Vascular Surgery)

## 2016-10-02 VITALS — BP 131/75 | HR 73 | Resp 20 | Ht 69.0 in | Wt 173.0 lb

## 2016-10-02 DIAGNOSIS — I7121 Aneurysm of the ascending aorta, without rupture: Secondary | ICD-10-CM

## 2016-10-02 DIAGNOSIS — Z952 Presence of prosthetic heart valve: Secondary | ICD-10-CM

## 2016-10-02 DIAGNOSIS — I712 Thoracic aortic aneurysm, without rupture: Secondary | ICD-10-CM

## 2016-10-02 NOTE — Progress Notes (Signed)
[address]       Dakota Martin,Dakota Martin [address]             [phone]    HPI: [name] returns to further discuss possible repair of his ascending aneurysm.  [name] is a 76 year old man with a past medical history significant for hypertension, hyperlipidemia, arthritis, gout, and aortic valve replacement for aortic stenosis. He had an aortic valve replacement with a Bjork Shiley valve in 1982. In 2015 he had a cardiac echocardiogram which showed the valve to have a mean gradient of 15 mmHg a peak gradient 31 mmHg. His ascending aorta was noted to be enlarged so a CT was done. 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 I have been following him every 6 months since then. I last saw him in May. At that time he was doing well but the aneurysm had grown from 5.1 to almost 5.5 cm in the interim. We had a long discussion about possibly going ahead with aortic aneurysm repair. We also discussed replacing his [name] valve at the time of aneurysm repair.  In the interim since his last visit he has had an echocardiogram. He continues to feel well with no chest pain, pressure, tightness, or shortness of breath. He remains physically active and plays tennis frequently. His echocardiogram showed the valve was functioning well but is immediately gradients had increased from his previous echo in 2015.   Past Medical History:  Diagnosis Date  . Arthritis   . Ascending aortic aneurysm ([place]) 02/14/2016   5 cm in 2015 // Chest CT 08/28/16:  Ascending thoracic aortic aneurysm 5.5 cm, Thoracic and abdominal aortic atherosclerosis, Remote granulomatous disease  . Colon polyp   . Gout   . Hyperlipidemia   . Hypertension   . S/P aortic valve replacement with metallic valve 63/87/5643   Due to congenital aortic stenosis // [name] mechanical valve done in 1982 // Echo 09/10/16: Mild focal basal septal hypertrophy, [name], [name], mean AV 17  mmHg, mod aortic root enlargement, aortic root 36 mm, ascending aorta 49 mm, [name], [name]   Past Surgical History:  Procedure Laterality Date  . Aortic valve  1982   prosthetic  val ve  . TONSILLECTOMY     Family History  Problem Relation Age of Onset  . Hyperlipidemia Mother   . Colon cancer Mother   . Emphysema Father   . Other Father        respiratory failure  . Diabetes Maternal Grandfather   . Colon polyps Neg Hx   . Kidney disease Neg Hx   . Esophageal cancer Neg Hx   . Gallbladder disease Neg Hx    Social History   Social History  . Marital status: Married    Spouse name: N/A  . Number of children: 2  . Years of education: N/A   Occupational History  . Retired    Social History Main Topics  . Smoking status: Never Smoker  . Smokeless tobacco: Never Used  . Alcohol use 0.0 oz/week     Comment: 2 drinks a day  . Drug use: No  . Sexual activity: Not on file   Other Topics Concern  . 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 allopurinol (ZYLOPRIM) 300 MG tablet Take 300 mg by mouth daily.    [name] Kitchen amLODipine (NORVASC) 5 MG tablet Take 5 mg by mouth daily.    [name] Kitchen amoxicillin (AMOXIL) 500 MG capsule Take 2,000 mg by mouth as needed. For dental visits     . atorvastatin (LIPITOR) 10 MG tablet Take 10 mg by mouth daily.    . carvedilol (COREG) 6.25 MG tablet Take 1 tablet by mouth two  times daily 180 tablet 3  . Cholecalciferol (VITAMIN D-3 PO) Take 1,000 Int'l Units by mouth daily.     . Coenzyme Q10 (CO Q 10 PO) Take 1 tablet by mouth daily.     . Cyanocobalamin (VITAMIN B-12) 5000 MCG SUBL Place under the tongue once a week.    . ezetimibe (ZETIA) 10 MG tablet Take 1/2 TABLET BY MOUTH (5 MG TOTAL) ONCE DAILY    . fish oil-omega-3 fatty acids 1000 MG capsule Take 2 g by mouth daily.    .  hydrochlorothiazide (MICROZIDE) 12.5 MG capsule Take 1 capsule by mouth  daily 90 capsule 3  . Magnesium 500 MG CAPS Take 500 mg by mouth daily.    [name] Kitchen OVER THE COUNTER MEDICATION Take 1 capsule by mouth daily. AYER-TRIPHALA    . potassium chloride (K-DUR,KLOR-CON) 10 MEQ tablet Take 1 tablet by mouth  daily 90 tablet 3  . telmisartan (MICARDIS) 80 MG tablet Take 80 mg by mouth daily.  11  . warfarin (COUMADIN) 5 MG tablet Take 5 mg by mouth daily. Take 4 days a week and 7.5 mg 3days/wk OR AS DIRECTED     No current facility-administered medications for this visit.     Physical Exam  Diagnostic Tests: ECHOCARDIOGRAM Study Conclusions  - Left ventricle: The cavity size was normal. There was mild focal   basal hypertrophy of the septum. Systolic function was normal.   The estimated ejection fraction was in the range of 55% to 60%. - Aortic valve: Mechanical AVR not well seen No peri valvualr   regurgitation. mean gradient gone from 15 to 17 mmHg peak has   gone from 31 mmHg to 41 mmgh. - Aorta: Moderate aortic root enlargement not well seen consider   CTA. - Mitral valve: Calcified annulus. Mildly thickened leaflets . - Atrial septum: No defect or patent foramen ovale was identified. - Pulmonary arteries: PA peak pressure: 37 mm Hg (S). CT CHEST WITHOUT CONTRAST  TECHNIQUE: Multidetector CT imaging of the chest was performed following the standard protocol without IV contrast.  COMPARISON: 02/14/2016  FINDINGS: Cardiovascular: Limited assessment of the vasculature, study is without contrast. Re- measuring at similar levels, the ascending thoracic aortic aneurysm has a maximal AP dimension of 5.5 cm, previously 5.1 cm and transverse dimension 5.3 cm, previously 5.1 cm. This represents very minimal increase compared to 02/14/2016. Three-vessel arch anatomy. Atherosclerosis present of the thoracic aorta. Remote aortic valve replacement creates artifact. Normal heart size. No  pericardial effusion.  Mediastinum/Nodes: No enlarged mediastinal or axillary lymph nodes. Thyroid gland, trachea, and esophagus demonstrate no significant findings.  Lungs/Pleura: Small punctate subpleural calcified granuloma measuring 3 mm in right lower lobe superior segment, image 66 series 4. Minor dependent bibasilar atelectasis and scarring. Additional subcentimeter calcified granulomas in the right lower lobe inferiorly. No focal pneumonia, collapse or consolidation. Negative for edema, effusion, pleural abnormality, or pneumothorax.  Upper Abdomen: Numerous scattered hepatic hypodense cysts as before. No biliary dilatation. Punctate splenic calcified granulomata. Abdominal  aortic atherosclerosis noted.  Musculoskeletal: Remote median sternotomy noted. Multilevel thoracic spondylosis. No acute compression fracture.  IMPRESSION: Minimal increase in diameter of the ascending thoracic aortic aneurysm, maximal dimension 5.5 cm, previously 5.1 cm.  Thoracic and abdominal aortic atherosclerosis  Remote granulomatous disease  Numerous hepatic renal cysts  No other acute intrathoracic abnormality.   Electronically Signed By: Jerilynn Mages. Shick M.D. On: 08/28/2016 15:04 I personally reviewed the CT chest and concur with findings noted above. His aneurysm is approaching 5.5 cm and is at least 5.4 cm. The changes approximately 4 mm over the past 6 months. I personally reviewed the echocardiogram and concur with the findings noted above.  Impression: Mr. Kornegay is a 76 year old man with history of a Bjork Shiley aortic valve replacement in 1982. He now has an ascending aneurysm that we've been following for several years. It has recently shown signs of growth between October of last year and May of this year. We discussed that he is at the lower limit of the area where surgery is recommended (5.5-6 cm). He also is right at the verge of indication for interval growth (4  versus 5 mm). In my personal opinion given that there has been interval increase of that magnitude and the overall size, I think the safest thing to do would be to go ahead and replace the aorta. The other option would be continued radiographic follow-up scan in 6 months. He understands the relative advantages and disadvantages of each of those approaches.  We discussed the possibility of replacing his 76 year old [place] valve. That was the only issue I would not recommend surgery. However going into replace the ascending aorta I think the best option would be to go ahead and replace that aortic valve as well. He had some doubts about that the last time I visited with him, but he has thought about it in the interval and would want that valve replaced. We discussed replacing that with a mechanical versus a tissue valve. Given his age tissue valve would be the preferred option. I certainly would defer to his opinion on the matter. I discussed potential longevity of the tissue valve in his age group he was surprised. That has changed a great deal since 1982. He would prefer a tissue valve. Potentially come off Coumadin altogether or at least be managed on a lower dose of anticoagulation.  I have discussed the general nature of the procedure, the need for general anesthesia, the use of cardiopulmonary bypass and the incisions to be used. We discussed the expected hospital stay, overall recovery and short and long term outcomes. I informed him of the indications, risks, benefits, and alternatives. He understands the risks include, but are not limited to death (3-4 %), stroke (1-2%), MI, DVT/PE, bleeding, possible need for transfusion, infections, cardiac arrhythmias, heart block requiring permanent pacemaker placement, as well as other organ system dysfunction including respiratory, renal, or GI complications.   He is not ready to schedule surgery at this point in time. He wishes to think it over a little  more. He has the number for our scheduler to call if he would like to go ahead and schedule.  He will need cardiac catheterization prior to surgery. We would like to arrange that closed the time of surgery to avoid having to go on and off Coumadin multiple times.  He had a dental cleaning about a month ago.  Plan:  Redo sternotomy for redo AVR and repair of ascending aortic aneurysm. Patient will call to  schedule.  Melrose Nakayama, MD [facility] [phone]

## 2016-10-03 ENCOUNTER — Telehealth: Payer: Self-pay | Admitting: Nurse Practitioner

## 2016-10-03 NOTE — Telephone Encounter (Signed)
I called and left message for patient to call our office when he is ready to schedule surgery so that our office can schedule his cardiac cath. I advised he may also call with any questions or concerns.

## 2016-10-03 NOTE — Telephone Encounter (Signed)
-----   Message from Thayer Headings, MD sent at 10/02/2016  5:11 PM EDT ----- Sharyn Lull, We will need to arrange for a cath on [name] soon  Thanks  Phil  ----- Message ----- From: Melrose Nakayama, MD Sent: 10/02/2016   2:27 PM To: Thayer Headings, MD  Abbe Amsterdam and Georga Hacking saw Lyndon Code back in the office today regarding his ascending aneurysm. As I indicated previously the aneurysm had increased in size from 5.1 to 5.5 cm. I'm concerned that he is at pretty high risk for dissection at this point. He's coming around to the idea of having the [facility] valve replaced since we will be in their working in that area anyway. He is going to let me know when he wants to do the surgery and we'll try to arrange for his catheterization in relatively close proximity so that we can avoid having to go on and off Lovenox multiple times.  Thank you  Richardson Landry

## 2016-10-05 NOTE — Telephone Encounter (Signed)
Pt called with some questions about cath and scheduling. Pt stated he needed his cath procedure about 2-3 days before his heart surgery to avoid coming off the coumadin multiple times. Dr. Roxan Hockey stated in his note from 10/02/16 that pt will need cath prior to surgery to avoid having to go on and off Coumadin multiple times.  Pt stated his heart surgery was not scheduled yet. He prefers the cath to be in late July or early August. I advised patient that once his heart surgery is scheduled to call our office back and let us know as soon as its scheduled so a nurse from our office can get his cath scheduled for him 2-3 days before his surgery. Pt said he will call us and let us know when his heart surgery is scheduled. Will forward to Dr. Elmarie Shiley covering nurse. Pt wanted to know if he could have a pillow under his knees during the cath. I advised patient that he will have to lie flat during the procedure and the cath is a sterilized procedure and that most likely they would not allow him to have a pillow under his legs during the cath.  Pt thanked me for my call and advised patient to call back if he has any further questions or concerns.

## 2016-10-05 NOTE — Telephone Encounter (Signed)
Follow up   Pt calling back to schedule cardiac cath and has questions

## 2016-10-17 ENCOUNTER — Other Ambulatory Visit: Payer: Self-pay | Admitting: Cardiovascular Disease

## 2016-10-19 ENCOUNTER — Other Ambulatory Visit: Payer: Self-pay | Admitting: *Deleted

## 2016-10-19 DIAGNOSIS — I7121 Aneurysm of the ascending aorta, without rupture: Secondary | ICD-10-CM

## 2016-10-19 DIAGNOSIS — I712 Thoracic aortic aneurysm, without rupture: Secondary | ICD-10-CM

## 2016-10-22 ENCOUNTER — Telehealth: Payer: Self-pay | Admitting: Cardiovascular Disease

## 2016-10-22 NOTE — Telephone Encounter (Signed)
°  New Message   pt verbalized that he is returning call for rn   To schedule the cath procedure the pt wants Monday the 23rd of July

## 2016-10-23 NOTE — Telephone Encounter (Signed)
Pt called to let Sharyn Lull Dr. Elmarie Shiley nurse know that he would like to have his Cardiac cath  scheduled for July 23 rd, because he is scheduled for surgery on the 25 th.

## 2016-10-23 NOTE — Telephone Encounter (Signed)
Follow up    Pt is calling back about scheduling this appt.

## 2016-10-29 ENCOUNTER — Encounter: Payer: Self-pay | Admitting: Nurse Practitioner

## 2016-10-29 ENCOUNTER — Other Ambulatory Visit: Payer: Medicare Other | Admitting: Sports Medicine

## 2016-10-29 NOTE — Telephone Encounter (Signed)
Left message for patient to call back about scheduling cardiac cath

## 2016-10-29 NOTE — Telephone Encounter (Signed)
Spoke with patient to review plan for cardiac cath on Monday 7/23 with Dr. Burt Knack. I advised him that I have spoken with Dr. Leonarda Salon nurse, Thurmond Butts, who advised pre-procedure labs will be done at [facility] on 7/20. I advised that coumadin instructions should be managed by Dr. Joylene Draft and Dr. Roxan Hockey since patient's coumadin is managed by Dr. Joylene Draft, his PCP. I advised him to hold HCTZ on the morning of the cath since his procedure is scheduled at 0730. I advised him to call back with questions or concerns prior to appointment. He verbalized understanding and thanked me for the call.

## 2016-10-30 ENCOUNTER — Telehealth: Payer: Self-pay | Admitting: Cardiovascular Disease

## 2016-10-30 NOTE — Telephone Encounter (Signed)
Spoke with patient who called to ask if he will need to stay overnight for his cardiac cath. I advised that there is a possibility that he would need to stay overnight, however it is less likely for him due to the fact that we are aware of his history. He verbalized understanding and thanked me for the call.

## 2016-10-30 NOTE — Telephone Encounter (Signed)
Follow Up:   Please call,questions about his instructions for his procedure.

## 2016-11-05 ENCOUNTER — Ambulatory Visit: Payer: Medicare Other | Admitting: Thoracic Surgery (Cardiothoracic Vascular Surgery)

## 2016-11-07 ENCOUNTER — Ambulatory Visit (INDEPENDENT_AMBULATORY_CARE_PROVIDER_SITE_OTHER): Payer: Medicare Other | Admitting: Thoracic Surgery (Cardiothoracic Vascular Surgery)

## 2016-11-07 ENCOUNTER — Encounter: Payer: Self-pay | Admitting: Thoracic Surgery (Cardiothoracic Vascular Surgery)

## 2016-11-07 ENCOUNTER — Telehealth: Payer: Self-pay | Admitting: Cardiovascular Disease

## 2016-11-07 VITALS — BP 155/74 | HR 68 | Resp 20 | Ht 69.0 in | Wt 175.0 lb

## 2016-11-07 DIAGNOSIS — I712 Thoracic aortic aneurysm, without rupture: Secondary | ICD-10-CM | POA: Diagnosis not present

## 2016-11-07 DIAGNOSIS — Z952 Presence of prosthetic heart valve: Secondary | ICD-10-CM | POA: Diagnosis not present

## 2016-11-07 DIAGNOSIS — I7121 Aneurysm of the ascending aorta, without rupture: Secondary | ICD-10-CM

## 2016-11-07 NOTE — Telephone Encounter (Signed)
New Message   pt verbalized that she is calling for rn   He wants to know if he should give himself his   injection the morning of his procedure with Dr.Cooper

## 2016-11-07 NOTE — Progress Notes (Signed)
[address]       [place] [address]             [phone]    HPI: Dakota Martin returns to further discuss possible repair of his ascending aneurysm.  Dakota Martin is a 76 year old man with a past medical history significant for hypertension, hyperlipidemia, arthritis, gout, and aortic valve replacement for aortic stenosis. He had an aortic valve replacement with a Bjork Shiley valve in 1982. In 2015 he had a cardiac echocardiogram which showed the valve to have a mean gradient of 15 mmHg a peak gradient 31 mmHg. His ascending aorta was noted to be enlarged so a CT was done. 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 I have been following him every 6 months since then. I last saw him in May. At that time he was doing well but the aneurysm had grown from 5.1 to almost 5.5 cm in the interim. We had a long discussion about possibly going ahead with aortic aneurysm repair. We also discussed replacing his [name] valve at the time of aneurysm repair.  He had an echocardiogram. He continues to feel well with no chest pain, pressure, tightness, or shortness of breath. He remains physically active and plays tennis frequently. His echocardiogram showed the valve was functioning well but his transvalvular gradients had increased from his previous echo in 2015. Mean gradient 15 up to 17 mmHg, peak gradient went from 31-41 mmHg. His LVEF was 55-60%.  When I saw him in early June he was not yet ready to make a decision as to whether to proceed with surgery. He now decided to proceed and is also decided he would like his [facility] valve replaced with a tissue valve at the time of surgery.  Past Medical History:  Diagnosis Date  . Arthritis   . Ascending aortic aneurysm ([place]) 02/14/2016   5 cm in 2015 // Chest CT 08/28/16:  Ascending thoracic aortic aneurysm 5.5 cm, Thoracic and abdominal aortic atherosclerosis, Remote granulomatous disease  . Colon polyp   . Gout   .  Hyperlipidemia   . Hypertension   . S/P aortic valve replacement with metallic valve 58/83/2549   Due to congenital aortic stenosis // [name] mechanical valve done in 1982 // Echo 09/10/16: Mild focal basal septal hypertrophy, [name], [name], [name], mod aortic root enlargement, aortic root 36 mm, ascending aorta 49 mm, [name], [name]   Past Surgical History:  Procedure Laterality Date  . Aortic valve  1982   prosthetic  val ve  . TONSILLECTOMY       Current Outpatient Prescriptions  Medication Sig Dispense Refill  . allopurinol (ZYLOPRIM) 300 MG tablet Take 300 mg by mouth daily.    [name] Kitchen amLODipine (NORVASC) 5 MG tablet Take 5 mg by mouth daily.    [name] Kitchen amoxicillin (AMOXIL) 500 MG capsule Take 2,000 mg by mouth as needed. For dental visits     . atorvastatin (LIPITOR) 10 MG tablet Take 10 mg by mouth daily.    . carvedilol (COREG) 6.25 MG tablet TAKE 1 TABLET BY MOUTH TWO  TIMES DAILY 180 tablet 3  . Cholecalciferol (VITAMIN D-3 PO) Take 1,000 Int'l Units by mouth daily.     . Coenzyme Q10 (CO Q 10 PO) Take 1 tablet by mouth daily.     . Cyanocobalamin (VITAMIN B-12) 5000 MCG SUBL Place under the tongue once a week.    [name] Kitchen  ezetimibe (ZETIA) 10 MG tablet Take 1/2 TABLET BY MOUTH (5 MG TOTAL) ONCE DAILY    . fish oil-omega-3 fatty acids 1000 MG capsule Take 2 g by mouth daily.    . hydrochlorothiazide (MICROZIDE) 12.5 MG capsule TAKE 1 CAPSULE BY MOUTH  DAILY 90 capsule 3  . Magnesium 500 MG CAPS Take 500 mg by mouth daily.    [name] Kitchen OVER THE COUNTER MEDICATION Take 1 capsule by mouth daily. AYER-TRIPHALA    . potassium chloride (K-DUR,KLOR-CON) 10 MEQ tablet TAKE 1 TABLET BY MOUTH  DAILY 90 tablet 3  . telmisartan (MICARDIS) 80 MG tablet Take 80 mg by mouth daily.  11  . warfarin (COUMADIN) 5 MG tablet Take 5 mg by mouth daily. Take 4 days a week and 7.5 mg 3days/wk OR AS DIRECTED     No current facility-administered medications for this  visit.    Family History  Problem Relation Age of Onset  . Hyperlipidemia Mother   . Colon cancer Mother   . Emphysema Father   . Other Father        respiratory failure  . Diabetes Maternal Grandfather   . Colon polyps Neg Hx   . Kidney disease Neg Hx   . Esophageal cancer Neg Hx   . Gallbladder disease Neg Hx    Social History   Social History  . Marital status: Married    Spouse name: N/A  . Number of children: 2  . Years of education: N/A   Occupational History  . Retired    Social History Main Topics  . Smoking status: Never Smoker  . Smokeless tobacco: Never Used  . Alcohol use 0.0 oz/week     Comment: 2 drinks a day  . Drug use: No  . Sexual activity: Not on file   Other Topics Concern  . 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 Valve click Abdomen soft nontender Extremities without clubbing cyanosis or edema Peripheral pulses intact  Diagnostic Tests: CT chest reviewed 5.4 cm ascending aortic aneurysm, returns to normal prior to the takeoff of the innominate artery.  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 He now has a 5.4 cm ascending aortic aneurysm. Given the growth of 4 mm and a 6 month period time in the size of 5.4 cm and the native valve having been a bicuspid valve, I think surgical repair is indicated. I think he is at extremely high risk for further expansion with potential for dissection or rupture. We have discussed, on multiple occasions,  the possibility of replacing the [facility] prosthesis. I advised him to do so. Using a tissue valve with potentially allow him to come off of Coumadin which can be much more problematic to manage as people age.  We previously have discussed the indications, risks, benefits, and alternatives in detail. He is well aware  of the risks and has accepted them he does not wish to further discuss those in detail at this time.  He is scheduled to have cardiac catheterization on 11/19/2016. He will stop his Coumadin on 11/13/2016. He will have an INR done on 7/20 and then be given instructions regarding Lovenox dosing.  Plan: Cardiac catheterization on 11/19/2016  Redo sternotomy, redo AVR, repair ascending aortic aneurysm ([place] procedure) on Wed 11/21/2016  Melrose Nakayama, MD [facility] [phone]

## 2016-11-07 NOTE — Telephone Encounter (Signed)
I spoke with the pt and his INR is managed by Dr Perini's office.  The pt has been set up for lovenox bridge prior to cardiac catheterization and will be taking lovenox twice a day. The pt questioned if he should do lovenox the morning of procedure and Dr Joylene Draft instructed him to ask Cardiology. I advised the pt that I will call him back with a response.

## 2016-11-07 NOTE — Telephone Encounter (Signed)
Discussed with Dr Burt Knack and the last dosage of lovenox should be the morning of 7/22. The pt should not take lovenox the evening of 7/22 and no lovenox the morning of 7/23. Pt verbalized understanding of instructions.

## 2016-11-13 ENCOUNTER — Other Ambulatory Visit: Payer: Medicare Other | Admitting: Sports Medicine

## 2016-11-15 ENCOUNTER — Other Ambulatory Visit (HOSPITAL_COMMUNITY): Payer: Self-pay | Admitting: *Deleted

## 2016-11-15 NOTE — Pre-Procedure Instructions (Signed)
Dakota Martin  11/15/2016    Your procedure is scheduled on Wednesday, November 21, 2016 at 7:30 AM.   Report to [facility] Entrance "A" Admitting Office at 5:30 AM.   Call this number if you have problems the morning of surgery: [phone]   Questions prior to day of surgery, please call [phone] between 8 & 4 PM.   Remember:  Do not eat food or drink liquids after midnight Tuesday, 11/20/16.   Take these medicines the morning of surgery with A SIP OF WATER: Amlodipine (Norvasc), Carvedilol (Coreg)  Stop NSAIDS (Ibuprofen, Aleve, etc), Fish Oil and Herbal medications as of today.   Do not wear jewelry.  Do not wear lotions, powders, cologne or deodorant.  Do not shave 48 hours prior to surgery.  Men may shave face and neck.  Do not bring valuables to the hospital.  [facility] is not responsible for any belongings or valuables.  Contacts, dentures or bridgework may not be worn into surgery.  Leave your suitcase in the car.  After surgery it may be brought to your room.  For patients admitted to the hospital, discharge time will be determined by your treatment team.   [facility] - Preparing for Surgery  Before surgery, you can play an important role.  Because skin is not sterile, your skin needs to be as free of germs as possible.  You can reduce the number of germs on you skin by washing with CHG (chlorahexidine gluconate) soap before surgery.  CHG is an antiseptic cleaner which kills germs and bonds with the skin to continue killing germs even after washing.  Please DO NOT use if you have an allergy to CHG or antibacterial soaps.  If your skin becomes reddened/irritated stop using the CHG and inform your nurse when you arrive at Short Stay.  Do not shave (including legs and underarms) for at least 48 hours prior to the first CHG shower.  You may shave your face.  Please follow these instructions carefully:   1.  Shower with CHG Soap the night before  surgery and the                    morning of Surgery.  2.  If you choose to wash your hair, wash your hair first as usual with your       normal shampoo.  3.  After you shampoo, rinse your hair and body thoroughly to remove the shampoo.  4.  Use CHG as you would any other liquid soap.  You can apply chg directly       to the skin and wash gently with scrungie or a clean washcloth.  5.  Apply the CHG Soap to your body ONLY FROM THE NECK DOWN.        Do not use on open wounds or open sores.  Avoid contact with your eyes, ears, mouth and genitals (private parts).  Wash genitals (private parts) with your normal soap.  6.  Wash thoroughly, paying special attention to the area where your surgery        will be performed.  7.  Thoroughly rinse your body with warm water from the neck down.  8.  DO NOT shower/wash with your normal soap after using and rinsing off       the CHG Soap.  9.  Pat yourself dry with a clean towel.            10.  Wear clean pajamas.            11.  Place clean sheets on your bed the night of your first shower and do not        sleep with pets.  Day of Surgery  Do not apply any lotions/deodorants the morning of surgery.  Please wear clean clothes to the hospital.   Please read over the fact sheets that you were given.

## 2016-11-16 ENCOUNTER — Ambulatory Visit (HOSPITAL_COMMUNITY)
Admission: RE | Admit: 2016-11-16 | Discharge: 2016-11-16 | Disposition: A | Discharge status: Home / Self Care | Payer: Medicare Other | Source: Ambulatory Visit | Attending: Thoracic Surgery (Cardiothoracic Vascular Surgery) | Admitting: Thoracic Surgery (Cardiothoracic Vascular Surgery)

## 2016-11-16 ENCOUNTER — Encounter (HOSPITAL_COMMUNITY): Payer: Self-pay

## 2016-11-16 ENCOUNTER — Ambulatory Visit (HOSPITAL_BASED_OUTPATIENT_CLINIC_OR_DEPARTMENT_OTHER)
Admission: RE | Admit: 2016-11-16 | Discharge: 2016-11-16 | Disposition: A | Discharge status: Home / Self Care | Payer: Medicare Other | Source: Ambulatory Visit | Attending: Thoracic Surgery (Cardiothoracic Vascular Surgery) | Admitting: Thoracic Surgery (Cardiothoracic Vascular Surgery)

## 2016-11-16 ENCOUNTER — Encounter (HOSPITAL_COMMUNITY)
Admission: RE | Admit: 2016-11-16 | Discharge: 2016-11-16 | Disposition: A | Discharge status: Home / Self Care | Payer: Medicare Other | Source: Ambulatory Visit | Attending: Thoracic Surgery (Cardiothoracic Vascular Surgery) | Admitting: Thoracic Surgery (Cardiothoracic Vascular Surgery)

## 2016-11-16 ENCOUNTER — Telehealth: Payer: Self-pay

## 2016-11-16 DIAGNOSIS — Z01812 Encounter for preprocedural laboratory examination: Secondary | ICD-10-CM | POA: Diagnosis not present

## 2016-11-16 DIAGNOSIS — I6523 Occlusion and stenosis of bilateral carotid arteries: Secondary | ICD-10-CM | POA: Diagnosis not present

## 2016-11-16 DIAGNOSIS — I440 Atrioventricular block, first degree: Secondary | ICD-10-CM | POA: Insufficient documentation

## 2016-11-16 DIAGNOSIS — I700 Atherosclerosis of aorta: Secondary | ICD-10-CM | POA: Insufficient documentation

## 2016-11-16 DIAGNOSIS — I712 Thoracic aortic aneurysm, without rupture: Secondary | ICD-10-CM

## 2016-11-16 DIAGNOSIS — I7121 Aneurysm of the ascending aorta, without rupture: Secondary | ICD-10-CM

## 2016-11-16 DIAGNOSIS — Z01818 Encounter for other preprocedural examination: Secondary | ICD-10-CM | POA: Diagnosis not present

## 2016-11-16 DIAGNOSIS — Z0183 Encounter for blood typing: Secondary | ICD-10-CM | POA: Diagnosis not present

## 2016-11-16 HISTORY — DX: Unspecified visual loss: H54.7

## 2016-11-16 HISTORY — DX: Cardiac murmur, unspecified: R01.1

## 2016-11-16 HISTORY — DX: Nonrheumatic aortic valve disorder, unspecified: I35.9

## 2016-11-16 LAB — URINALYSIS, ROUTINE W REFLEX MICROSCOPIC
Bilirubin Urine: NEGATIVE
Glucose, UA: NEGATIVE mg/dL
Hgb urine dipstick: NEGATIVE
KETONES UR: NEGATIVE mg/dL
LEUKOCYTES UA: NEGATIVE
NITRITE: NEGATIVE
PH: 6 (ref 5.0–8.0)
PROTEIN: NEGATIVE mg/dL
Specific Gravity, Urine: 1.011 (ref 1.005–1.030)

## 2016-11-16 LAB — PULMONARY FUNCTION TEST
DL/VA % pred: 76 %
DL/VA: 3.46 ml/min/mmHg/L
DLCO UNC % PRED: 61 %
DLCO UNC: 19.15 ml/min/mmHg
FEF 25-75 PRE: 2.22 L/s
FEF 25-75 Post: 1.99 L/sec
FEF2575-%CHANGE-POST: -10 %
FEF2575-%PRED-PRE: 104 %
FEF2575-%Pred-Post: 93 %
FEV1-%Change-Post: -1 %
FEV1-%PRED-POST: 90 %
FEV1-%Pred-Pre: 91 %
FEV1-Post: 2.65 L
FEV1-Pre: 2.69 L
FEV1FVC-%CHANGE-POST: 0 %
FEV1FVC-%Pred-Pre: 101 %
FEV6-%CHANGE-POST: -2 %
FEV6-%PRED-PRE: 94 %
FEV6-%Pred-Post: 92 %
FEV6-Post: 3.51 L
FEV6-Pre: 3.59 L
FEV6FVC-%CHANGE-POST: 0 %
FEV6FVC-%PRED-POST: 104 %
FEV6FVC-%Pred-Pre: 105 %
FVC-%Change-Post: -2 %
FVC-%Pred-Post: 87 %
FVC-%Pred-Pre: 89 %
FVC-Post: 3.56 L
FVC-Pre: 3.64 L
POST FEV6/FVC RATIO: 99 %
PRE FEV1/FVC RATIO: 74 %
Post FEV1/FVC ratio: 74 %
Pre FEV6/FVC Ratio: 99 %
RV % pred: 81 %
RV: 2.04 L
TLC % PRED: 82 %
TLC: 5.64 L

## 2016-11-16 LAB — COMPREHENSIVE METABOLIC PANEL
ALT: 41 U/L (ref 17–63)
AST: 34 U/L (ref 15–41)
Albumin: 4.4 g/dL (ref 3.5–5.0)
Alkaline Phosphatase: 71 U/L (ref 38–126)
Anion gap: 10 (ref 5–15)
BILIRUBIN TOTAL: 1 mg/dL (ref 0.3–1.2)
BUN: 20 mg/dL (ref 6–20)
CHLORIDE: 105 mmol/L (ref 101–111)
CO2: 21 mmol/L — ABNORMAL LOW (ref 22–32)
Calcium: 9.3 mg/dL (ref 8.9–10.3)
Creatinine, Ser: 0.97 mg/dL (ref 0.61–1.24)
GFR calc Af Amer: >60 mL/min (ref >60)
Glucose, Bld: 108 mg/dL — ABNORMAL HIGH (ref 65–99)
Potassium: 4.4 mmol/L (ref 3.5–5.1)
Sodium: 136 mmol/L (ref 135–145)
Total Protein: 7.1 g/dL (ref 6.5–8.1)

## 2016-11-16 LAB — VAS US DOPPLER PRE CABG
LCCADSYS: -57 cm/s
LCCAPDIAS: 12 cm/s
LCCAPSYS: 84 cm/s
LEFT ECA DIAS: -17 cm/s
LEFT VERTEBRAL DIAS: -12 cm/s
LICADDIAS: -19 cm/s
LICADSYS: -59 cm/s
LICAPDIAS: 11 cm/s
LICAPSYS: 47 cm/s
Left CCA dist dias: -11 cm/s
RCCAPSYS: -73 cm/s
RIGHT ECA DIAS: -10 cm/s
RIGHT VERTEBRAL DIAS: -12 cm/s
Right CCA prox dias: -12 cm/s
Right cca dist sys: -92 cm/s

## 2016-11-16 LAB — CBC
HEMATOCRIT: 41.2 % (ref 39.0–52.0)
HEMOGLOBIN: 14.2 g/dL (ref 13.0–17.0)
MCH: 32.4 pg (ref 26.0–34.0)
MCHC: 34.5 g/dL (ref 30.0–36.0)
MCV: 94.1 fL (ref 78.0–100.0)
PLATELETS: 131 10*3/uL — AB (ref 150–400)
RBC: 4.38 MIL/uL (ref 4.22–5.81)
RDW: 14.7 % (ref 11.5–15.5)
WBC: 8.4 10*3/uL (ref 4.0–10.5)

## 2016-11-16 LAB — BLOOD GAS, ARTERIAL
Acid-base deficit: 0.2 mmol/L (ref 0.0–2.0)
Bicarbonate: 23.4 mmol/L (ref 20.0–28.0)
DRAWN BY: 470591
FIO2: 21
O2 Saturation: 97.3 %
PH ART: 7.44 (ref 7.350–7.450)
Patient temperature: 98.6
pCO2 arterial: 35 mmHg (ref 32.0–48.0)
pO2, Arterial: 97.9 mmHg (ref 83.0–108.0)

## 2016-11-16 LAB — ABO/RH: ABO/RH(D): O POS

## 2016-11-16 LAB — SURGICAL PCR SCREEN
MRSA, PCR: NEGATIVE
Staphylococcus aureus: POSITIVE — AB

## 2016-11-16 LAB — APTT: aPTT: 64 seconds — ABNORMAL HIGH (ref 24–36)

## 2016-11-16 LAB — PROTIME-INR
INR: 1.8
PROTHROMBIN TIME: 21.1 s — AB (ref 11.4–15.2)

## 2016-11-16 MED ORDER — ALBUTEROL SULFATE (2.5 MG/3ML) 0.083% IN NEBU
2.5000 mg | INHALATION_SOLUTION | Freq: Once | RESPIRATORY_TRACT | Status: AC
  Administered 2016-11-16: 2.5 mg via RESPIRATORY_TRACT

## 2016-11-16 NOTE — Telephone Encounter (Signed)
Left detailed message per DPR.    VM left for pre-catheterization at [facility] scheduled for: 11/19/2016 @ 0730 Verified arrival time and place:  NT @ 0530 Confirmed AM meds to be taken pre-cath with sip of water: Reiterated Pt last dose Lovenox is Sunday AM 11/18/2016.   Advised to hold HCTZ day of procedure. Advised Pt to take ASA 81 mg prior to arrival for procedure Notified must have responsible person to drive home post procedure and observe patient for 24 hours. Addl concerns:  Left this nurse name and # for any further questions

## 2016-11-16 NOTE — Progress Notes (Signed)
Pt has never had an MI, only cardiac history is Aortic Valve replacement and a aortic aneurysm. Pt denies chest pain or sob. Pt is not diabetic.

## 2016-11-16 NOTE — Progress Notes (Signed)
Pre-op Cardiac Surgery  Carotid Findings:  Bilateral 1-39% ICA stenosis, antegrade vertebral flow.   Upper Extremity Right Left  Brachial Pressures 135, Tri 141, Tri  Radial Waveforms Tri Tri  Ulnar Waveforms Tri Tri  Palmar Arch (Allen's Test) waveform increases greater than 50% with radial compression and reverses with ulnar compression waveform increases less than 50% with radial compression and reverses with ulnar compression   Lower  Extremity Right Left  Dorsalis Pedis 196, Bi 255- [place], Bi  Posterior Tibial 255- [place], Bip 255- [place], Bi  Great Toe 87 90  TOE/Brachial Indices 0.62 0.64    Findings:  ABI's were non-compressible, TBI's are abnormal  Lita Mains- RDMS, RVT 1:53 PM  11/16/2016

## 2016-11-16 NOTE — Progress Notes (Signed)
Mupirocin Ointment Rx called into [facility] on [address] for positive PCR of Staph. Pt notified and voiced understanding.

## 2016-11-17 LAB — HEMOGLOBIN A1C
Hgb A1c MFr Bld: 5.9 % — ABNORMAL HIGH (ref 4.8–5.6)
Mean Plasma Glucose: 123 mg/dL

## 2016-11-19 ENCOUNTER — Ambulatory Visit (HOSPITAL_BASED_OUTPATIENT_CLINIC_OR_DEPARTMENT_OTHER)
Admission: RE | Admit: 2016-11-19 | Discharge: 2016-11-19 | Disposition: A | Discharge status: Home / Self Care | Payer: Medicare Other | Source: Ambulatory Visit | Attending: Cardiovascular Disease | Admitting: Cardiovascular Disease

## 2016-11-19 ENCOUNTER — Encounter (HOSPITAL_COMMUNITY)
Admission: RE | Disposition: A | Discharge status: Home / Self Care | Payer: Self-pay | Source: Ambulatory Visit | Attending: Cardiovascular Disease

## 2016-11-19 DIAGNOSIS — E785 Hyperlipidemia, unspecified: Secondary | ICD-10-CM

## 2016-11-19 DIAGNOSIS — I350 Nonrheumatic aortic (valve) stenosis: Secondary | ICD-10-CM

## 2016-11-19 DIAGNOSIS — M1990 Unspecified osteoarthritis, unspecified site: Secondary | ICD-10-CM | POA: Insufficient documentation

## 2016-11-19 DIAGNOSIS — Z952 Presence of prosthetic heart valve: Secondary | ICD-10-CM

## 2016-11-19 DIAGNOSIS — M109 Gout, unspecified: Secondary | ICD-10-CM | POA: Insufficient documentation

## 2016-11-19 DIAGNOSIS — I2510 Atherosclerotic heart disease of native coronary artery without angina pectoris: Secondary | ICD-10-CM

## 2016-11-19 DIAGNOSIS — I2584 Coronary atherosclerosis due to calcified coronary lesion: Secondary | ICD-10-CM

## 2016-11-19 DIAGNOSIS — Z7901 Long term (current) use of anticoagulants: Secondary | ICD-10-CM

## 2016-11-19 DIAGNOSIS — I10 Essential (primary) hypertension: Secondary | ICD-10-CM

## 2016-11-19 DIAGNOSIS — I712 Thoracic aortic aneurysm, without rupture: Secondary | ICD-10-CM | POA: Insufficient documentation

## 2016-11-19 DIAGNOSIS — I7121 Aneurysm of the ascending aorta, without rupture: Secondary | ICD-10-CM | POA: Diagnosis present

## 2016-11-19 LAB — PROTIME-INR
INR: 0.98
PROTHROMBIN TIME: 13 s (ref 11.4–15.2)

## 2016-11-19 LAB — POCT I-STAT 3, ART BLOOD GAS (G3+)
ACID-BASE EXCESS: 1 mmol/L (ref 0.0–2.0)
BICARBONATE: 25.6 mmol/L (ref 20.0–28.0)
O2 Saturation: 97 %
PO2 ART: 91 mmHg (ref 83.0–108.0)
TCO2: 27 mmol/L (ref 0–100)
pCO2 arterial: 41.2 mmHg (ref 32.0–48.0)
pH, Arterial: 7.401 (ref 7.350–7.450)

## 2016-11-19 LAB — POCT I-STAT 3, VENOUS BLOOD GAS (G3P V)
ACID-BASE EXCESS: 3 mmol/L — AB (ref 0.0–2.0)
Bicarbonate: 28 mmol/L (ref 20.0–28.0)
O2 SAT: 74 %
PO2 VEN: 40 mmHg (ref 32.0–45.0)
TCO2: 29 mmol/L (ref 0–100)
pCO2, Ven: 45.4 mmHg (ref 44.0–60.0)
pH, Ven: 7.398 (ref 7.250–7.430)

## 2016-11-19 SURGERY — RIGHT HEART CATH AND CORONARY ANGIOGRAPHY
Anesthesia: LOCAL

## 2016-11-19 MED ORDER — ACETAMINOPHEN 325 MG PO TABS: 650.0000 mg | ORAL_TABLET | ORAL | Status: DC | PRN

## 2016-11-19 MED ORDER — SODIUM CHLORIDE 0.9 % WEIGHT BASED INFUSION: 1.0000 mL/kg/h | INTRAVENOUS | Status: DC

## 2016-11-19 MED ORDER — SODIUM CHLORIDE 0.9% FLUSH: 3.0000 mL | INTRAVENOUS | Status: DC | PRN

## 2016-11-19 MED ORDER — ONDANSETRON HCL 4 MG/2ML IJ SOLN: 4.0000 mg | Freq: Four times a day (QID) | INTRAMUSCULAR | Status: DC | PRN

## 2016-11-19 MED ORDER — FENTANYL CITRATE (PF) 100 MCG/2ML IJ SOLN
INTRAMUSCULAR | Status: DC | PRN
  Administered 2016-11-19: 25 ug via INTRAVENOUS

## 2016-11-19 MED ORDER — MIDAZOLAM HCL 2 MG/2ML IJ SOLN
INTRAMUSCULAR | Status: AC
  Filled 2016-11-19: qty 2

## 2016-11-19 MED ORDER — SODIUM CHLORIDE 0.9% FLUSH: 3.0000 mL | Freq: Two times a day (BID) | INTRAVENOUS | Status: DC

## 2016-11-19 MED ORDER — VERAPAMIL HCL 2.5 MG/ML IV SOLN
INTRAVENOUS | Status: AC
  Filled 2016-11-19: qty 2

## 2016-11-19 MED ORDER — FENTANYL CITRATE (PF) 100 MCG/2ML IJ SOLN
INTRAMUSCULAR | Status: AC
  Filled 2016-11-19: qty 2

## 2016-11-19 MED ORDER — LIDOCAINE HCL (PF) 1 % IJ SOLN
INTRAMUSCULAR | Status: DC | PRN
  Administered 2016-11-19 (×2): 2 mL

## 2016-11-19 MED ORDER — MIDAZOLAM HCL 2 MG/2ML IJ SOLN
INTRAMUSCULAR | Status: DC | PRN
  Administered 2016-11-19: 2 mg via INTRAVENOUS

## 2016-11-19 MED ORDER — SODIUM CHLORIDE 0.9 % WEIGHT BASED INFUSION
3.0000 mL/kg/h | INTRAVENOUS | Status: AC
  Administered 2016-11-19: 3 mL/kg/h via INTRAVENOUS

## 2016-11-19 MED ORDER — HEPARIN (PORCINE) IN NACL 2-0.9 UNIT/ML-% IJ SOLN
INTRAMUSCULAR | Status: AC
  Filled 2016-11-19: qty 1000

## 2016-11-19 MED ORDER — IOPAMIDOL (ISOVUE-370) INJECTION 76%
INTRAVENOUS | Status: DC | PRN
  Administered 2016-11-19: 70 mL

## 2016-11-19 MED ORDER — HEPARIN SODIUM (PORCINE) 1000 UNIT/ML IJ SOLN
INTRAMUSCULAR | Status: DC | PRN
  Administered 2016-11-19: 4000 [IU] via INTRAVENOUS

## 2016-11-19 MED ORDER — HEPARIN SODIUM (PORCINE) 1000 UNIT/ML IJ SOLN
INTRAMUSCULAR | Status: AC
  Filled 2016-11-19: qty 1

## 2016-11-19 MED ORDER — VERAPAMIL HCL 2.5 MG/ML IV SOLN
INTRAVENOUS | Status: DC | PRN
  Administered 2016-11-19: 10 mL via INTRA_ARTERIAL

## 2016-11-19 MED ORDER — LIDOCAINE HCL (PF) 1 % IJ SOLN
INTRAMUSCULAR | Status: AC
  Filled 2016-11-19: qty 30

## 2016-11-19 MED ORDER — SODIUM CHLORIDE 0.9 % IV SOLN: 250.0000 mL | INTRAVENOUS | Status: DC | PRN

## 2016-11-19 MED ORDER — ASPIRIN 81 MG PO CHEW: 81.0000 mg | CHEWABLE_TABLET | ORAL | Status: AC

## 2016-11-19 MED ORDER — HEPARIN (PORCINE) IN NACL 2-0.9 UNIT/ML-% IJ SOLN
INTRAMUSCULAR | Status: DC | PRN
  Administered 2016-11-19: 08:00:00

## 2016-11-19 SURGICAL SUPPLY — 12 items
CATH 5FR JL3.5 JR4 ANG PIG MP (CATHETERS) ×1 IMPLANT
CATH BALLN WEDGE 5F 110CM (CATHETERS) ×1 IMPLANT
CATH INFINITI 5FR JL5 (CATHETERS) ×1 IMPLANT
DEVICE RAD COMP TR BAND LRG (VASCULAR PRODUCTS) ×1 IMPLANT
GLIDESHEATH SLEND SS 6F .021 (SHEATH) ×1 IMPLANT
GUIDEWIRE INQWIRE 1.5J.035X260 (WIRE) IMPLANT
INQWIRE 1.5J .035X260CM (WIRE) ×2
KIT HEART LEFT (KITS) ×2 IMPLANT
PACK CARDIAC CATHETERIZATION (CUSTOM PROCEDURE TRAY) ×2 IMPLANT
SHEATH GLIDE SLENDER 4/5FR (SHEATH) ×1 IMPLANT
TRANSDUCER W/STOPCOCK (MISCELLANEOUS) ×2 IMPLANT
TUBING CIL FLEX 10 FLL-RA (TUBING) ×2 IMPLANT

## 2016-11-19 NOTE — Discharge Instructions (Signed)
Radial Site Care  Refer to this sheet in the next few weeks. These instructions provide you with information about caring for yourself after your procedure. Your health care provider may also give you more specific instructions. Your treatment has been planned according to current medical practices, but problems sometimes occur. Call your health care provider if you have any problems or questions after your procedure. What can I expect after the procedure? After your procedure, it is typical to have the following:  Bruising at the radial site that usually fades within 1-2 weeks.  Blood collecting in the tissue (hematoma) that may be painful to the touch. It should usually decrease in size and tenderness within 1-2 weeks.  Follow these instructions at home:  Take medicines only as directed by your health care provider.  You may shower 24 hours after the procedure or as directed by your health care provider. Remove the bandage (dressing) and gently wash the site with plain soap and water. Pat the area dry with a clean towel. Do not rub the site, because this may cause bleeding.  Do not take baths, swim, or use a hot tub until your health care provider approves.  Check your insertion site every day for redness, swelling, or drainage.  Do not apply powder or lotion to the site.  Do not flex or bend the affected arm for 24 hours or as directed by your health care provider.  Do not push or pull heavy objects with the affected arm for 24 hours or as directed by your health care provider.  Do not lift over 10 lb (4.5 kg) for 5 days after your procedure or as directed by your health care provider.  Ask your health care provider when it is okay to: ? Return to work or school. ? Resume usual physical activities or sports. ? Resume sexual activity.  Do not drive home if you are discharged the same day as the procedure. Have someone else drive you.  You may drive 24 hours after the procedure  unless otherwise instructed by your health care provider.  Do not operate machinery or power tools for 24 hours after the procedure.  If your procedure was done as an outpatient procedure, which means that you went home the same day as your procedure, a responsible adult should be with you for the first 24 hours after you arrive home.  Keep all follow-up visits as directed by your health care provider. This is important. Contact a health care provider if:  You have a fever.  You have chills.  You have increased bleeding from the radial site. Hold pressure on the site. Get help right away if:  You have unusual pain at the radial site.  You have redness, warmth, or swelling at the radial site.  You have drainage (other than a small amount of blood on the dressing) from the radial site.  The radial site is bleeding, and the bleeding does not stop after 30 minutes of holding steady pressure on the site.  Your arm or hand becomes pale, cool, tingly, or numb.  This information is not intended to replace advice given to you by your health care provider. Make sure you discuss any questions you have with your health care provider. Document Released: 05/19/2010 Document Revised: 09/22/2015 Document Reviewed: 11/02/2013 Elsevier Interactive Patient Education  2018 [employer].

## 2016-11-19 NOTE — Interval H&P Note (Signed)
History and Physical Interval Note:  11/19/2016 7:29 AM  Dakota Martin  has presented today for surgery, with the diagnosis of surgical clearance  The various methods of treatment have been discussed with the patient and family. After consideration of risks, benefits and other options for treatment, the patient has consented to  Procedure(s): Right/Left Heart Cath and Coronary Angiography (N/A) as a surgical intervention .  The patient's history has been reviewed, patient examined, no change in status, stable for surgery.  I have reviewed the patient's chart and labs.  Questions were answered to the patient's satisfaction.     Sherren Mocha

## 2016-11-19 NOTE — H&P (View-Only) (Signed)
    [address]       [place] [address]             [phone]    HPI: Dakota Martin returns to further discuss possible repair of his ascending aneurysm.  Dakota Martin is a 75-year-old man with a past medical history significant for hypertension, hyperlipidemia, arthritis, gout, and aortic valve replacement for aortic stenosis. He had an aortic valve replacement with a Bjork Shiley valve in 1982. In 2015 he had a cardiac echocardiogram which showed the valve to have a mean gradient of 15 mmHg a peak gradient 31 mmHg. His ascending aorta was noted to be enlarged so a CT was done. 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 I have been following him every 6 months since then. I last saw him in May. At that time he was doing well but the aneurysm had grown from 5.1 to almost 5.5 cm in the interim. We had a long discussion about possibly going ahead with aortic aneurysm repair. We also discussed replacing his Bjork Shiley valve at the time of aneurysm repair.  He had an echocardiogram. He continues to feel well with no chest pain, pressure, tightness, or shortness of breath. He remains physically active and plays tennis frequently. His echocardiogram showed the valve was functioning well but his transvalvular gradients had increased from his previous echo in 2015. Mean gradient 15 up to 17 mmHg, peak gradient went from 31-41 mmHg. His LVEF was 55-60%.  When I saw him in early June he was not yet ready to make a decision as to whether to proceed with surgery. He now decided to proceed and is also decided he would like his Bjork Shiley valve replaced with a tissue valve at the time of surgery.  Past Medical History:  Diagnosis Date  . Arthritis   . Ascending aortic aneurysm (HCC) 02/14/2016   5 cm in 2015 // Chest CT 08/28/16:  Ascending thoracic aortic aneurysm 5.5 cm, Thoracic and abdominal aortic atherosclerosis, Remote granulomatous disease  . Colon polyp   . Gout   .  Hyperlipidemia   . Hypertension   . 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 Aortic valve  1982   prosthetic  val ve  . TONSILLECTOMY       Current Outpatient Prescriptions  Medication Sig Dispense Refill  . allopurinol (ZYLOPRIM) 300 MG tablet Take 300 mg by mouth daily.    . amLODipine (NORVASC) 5 MG tablet Take 5 mg by mouth daily.    . amoxicillin (AMOXIL) 500 MG capsule Take 2,000 mg by mouth as needed. For dental visits     . atorvastatin (LIPITOR) 10 MG tablet Take 10 mg by mouth daily.    . carvedilol (COREG) 6.25 MG tablet TAKE 1 TABLET BY MOUTH TWO  TIMES DAILY 180 tablet 3  . Cholecalciferol (VITAMIN D-3 PO) Take 1,000 Int'l Units by mouth daily.     . Coenzyme Q10 (CO Q 10 PO) Take 1 tablet by mouth daily.     . Cyanocobalamin (VITAMIN B-12) 5000 MCG SUBL Place under the tongue once a week.    .   ezetimibe (ZETIA) 10 MG tablet Take 1/2 TABLET BY MOUTH (5 MG TOTAL) ONCE DAILY    . fish oil-omega-3 fatty acids 1000 MG capsule Take 2 g by mouth daily.    . hydrochlorothiazide (MICROZIDE) 12.5 MG capsule TAKE 1 CAPSULE BY MOUTH  DAILY 90 capsule 3  . Magnesium 500 MG CAPS Take 500 mg by mouth daily.    . OVER THE COUNTER MEDICATION Take 1 capsule by mouth daily. AYER-TRIPHALA    . potassium chloride (K-DUR,KLOR-CON) 10 MEQ tablet TAKE 1 TABLET BY MOUTH  DAILY 90 tablet 3  . telmisartan (MICARDIS) 80 MG tablet Take 80 mg by mouth daily.  11  . warfarin (COUMADIN) 5 MG tablet Take 5 mg by mouth daily. Take 4 days a week and 7.5 mg 3days/wk OR AS DIRECTED     No current facility-administered medications for this  visit.    Family History  Problem Relation Age of Onset  . Hyperlipidemia Mother   . Colon cancer Mother   . Emphysema Father   . Other Father        respiratory failure  . Diabetes Maternal Grandfather   . Colon polyps Neg Hx   . Kidney disease Neg Hx   . Esophageal cancer Neg Hx   . Gallbladder disease Neg Hx    Social History   Social History  . Marital status: Married    Spouse name: N/A  . Number of children: 2  . Years of education: N/A   Occupational History  . Retired    Social History Main Topics  . Smoking status: Never Smoker  . Smokeless tobacco: Never Used  . Alcohol use 0.0 oz/week     Comment: 2 drinks a day  . Drug use: No  . Sexual activity: Not on file   Other Topics Concern  . 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 Valve click Abdomen soft nontender Extremities without clubbing cyanosis or edema Peripheral pulses intact  Diagnostic Tests: CT chest reviewed 5.4 cm ascending aortic aneurysm, returns to normal prior to the takeoff of the innominate artery.  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 He now has a 5.4 cm ascending aortic aneurysm. Given the growth of 4 mm and a 6 month period time in the size of 5.4 cm and the native valve having been a bicuspid valve, I think surgical repair is indicated. I think he is at extremely high risk for further expansion with potential for dissection or rupture. We have discussed, on multiple occasions,  the possibility of replacing the Bjork Shiley prosthesis. I advised him to do so. Using a tissue valve with potentially allow him to come off of Coumadin which can be much more problematic to manage as people age.  We previously have discussed the indications, risks, benefits, and alternatives in detail. He is well aware   of the risks and has accepted them he does not wish to further discuss those in detail at this time.  He is scheduled to have cardiac catheterization on 11/19/2016. He will stop his Coumadin on 11/13/2016. He will have an INR done on 7/20 and then be given instructions regarding Lovenox dosing.  Plan: Cardiac catheterization on 11/19/2016  Redo sternotomy, redo AVR, repair ascending aortic aneurysm ([place] procedure) on Wed 11/21/2016  Oluwadamilola Deliz C Ida Uppal, MD [facility] [phone]    

## 2016-11-20 ENCOUNTER — Other Ambulatory Visit: Payer: Self-pay | Admitting: *Deleted

## 2016-11-20 DIAGNOSIS — I712 Thoracic aortic aneurysm, without rupture: Secondary | ICD-10-CM

## 2016-11-20 DIAGNOSIS — I7121 Aneurysm of the ascending aorta, without rupture: Secondary | ICD-10-CM

## 2016-11-20 MED ORDER — SODIUM CHLORIDE 0.9 % IV SOLN
INTRAVENOUS | Status: AC
Start: 2016-11-21 — End: 2016-11-21
  Administered 2016-11-21: 1 [IU]/h via INTRAVENOUS
  Filled 2016-11-20: qty 1

## 2016-11-20 MED ORDER — MAGNESIUM SULFATE 50 % IJ SOLN
40.0000 meq | INTRAMUSCULAR | Status: DC
  Filled 2016-11-20: qty 10

## 2016-11-20 MED ORDER — EPINEPHRINE PF 1 MG/ML IJ SOLN
0.0000 ug/min | INTRAVENOUS | Status: DC
  Filled 2016-11-20: qty 4

## 2016-11-20 MED ORDER — DEXTROSE 5 % IV SOLN
1.5000 g | INTRAVENOUS | Status: AC
  Administered 2016-11-21: 1.5 g via INTRAVENOUS
  Administered 2016-11-21: .75 g via INTRAVENOUS
  Filled 2016-11-20 (×2): qty 1.5

## 2016-11-20 MED ORDER — VANCOMYCIN HCL 10 G IV SOLR
1250.0000 mg | INTRAVENOUS | Status: AC
Start: 2016-11-21 — End: 2016-11-21
  Administered 2016-11-21: 1250 mg via INTRAVENOUS
  Filled 2016-11-20: qty 1250

## 2016-11-20 MED ORDER — TRANEXAMIC ACID (OHS) PUMP PRIME SOLUTION
2.0000 mg/kg | INTRAVENOUS | Status: DC
Start: 2016-11-21 — End: 2016-11-21
  Filled 2016-11-20: qty 1.53

## 2016-11-20 MED ORDER — DEXMEDETOMIDINE HCL IN NACL 400 MCG/100ML IV SOLN
0.1000 ug/kg/h | INTRAVENOUS | Status: AC
  Administered 2016-11-21: .5 ug/kg/h via INTRAVENOUS
  Filled 2016-11-20: qty 100

## 2016-11-20 MED ORDER — POTASSIUM CHLORIDE 2 MEQ/ML IV SOLN
80.0000 meq | INTRAVENOUS | Status: DC
  Filled 2016-11-20: qty 40

## 2016-11-20 MED ORDER — CEFUROXIME SODIUM 750 MG IJ SOLR
750.0000 mg | INTRAMUSCULAR | Status: DC
Start: 2016-11-21 — End: 2016-11-21
  Filled 2016-11-20: qty 750

## 2016-11-20 MED ORDER — SODIUM CHLORIDE 0.9 % IV SOLN
1.5000 mg/kg/h | INTRAVENOUS | Status: DC
Start: 2016-11-21 — End: 2016-11-21
  Filled 2016-11-20: qty 25

## 2016-11-20 MED ORDER — TRANEXAMIC ACID (OHS) BOLUS VIA INFUSION
15.0000 mg/kg | INTRAVENOUS | Status: AC
  Administered 2016-11-21: 1150.5 mg via INTRAVENOUS
  Filled 2016-11-20: qty 1151

## 2016-11-20 MED ORDER — PLASMA-LYTE 148 IV SOLN
INTRAVENOUS | Status: AC
  Administered 2016-11-21: 10:00:00
  Filled 2016-11-20: qty 2.5

## 2016-11-20 MED ORDER — METOPROLOL TARTRATE 12.5 MG HALF TABLET: 12.5000 mg | ORAL_TABLET | Freq: Once | ORAL | Status: DC

## 2016-11-20 MED ORDER — DOPAMINE-DEXTROSE 3.2-5 MG/ML-% IV SOLN
0.0000 ug/kg/min | INTRAVENOUS | Status: DC
  Filled 2016-11-20: qty 250

## 2016-11-20 MED ORDER — SODIUM CHLORIDE 0.9 % IV SOLN
INTRAVENOUS | Status: DC
  Filled 2016-11-20: qty 30

## 2016-11-20 MED ORDER — SODIUM CHLORIDE 0.9 % IV SOLN
30.0000 ug/min | INTRAVENOUS | Status: AC
  Administered 2016-11-21: 60 ug/min via INTRAVENOUS
  Filled 2016-11-20: qty 2

## 2016-11-20 MED ORDER — CHLORHEXIDINE GLUCONATE 0.12 % MT SOLN: 15.0000 mL | Freq: Once | OROMUCOSAL | Status: DC

## 2016-11-20 MED ORDER — NITROGLYCERIN IN D5W 200-5 MCG/ML-% IV SOLN
2.0000 ug/min | INTRAVENOUS | Status: AC
  Administered 2016-11-21: 5 ug/min via INTRAVENOUS
  Filled 2016-11-20: qty 250

## 2016-11-20 NOTE — Anesthesia Preprocedure Evaluation (Addendum)
Anesthesia Evaluation  Patient identified by MRN, date of birth, ID band Patient awake    Reviewed: Allergy & Precautions, NPO status , Patient's Chart, lab work & pertinent test results, reviewed documented beta blocker date and time   Airway Mallampati: II  TM Distance: >3 FB Neck ROM: Full    Dental  (+) Teeth Intact, Dental Advisory Given, Chipped,    Pulmonary neg pulmonary ROS,    Pulmonary exam normal breath sounds clear to auscultation       Cardiovascular hypertension, Pt. on medications and Pt. on home beta blockers + Peripheral Vascular Disease (Ascending aortic aneurysm 5.5cm)  Normal cardiovascular exam+ Valvular Problems/Murmurs (aortic valve replacement for aortic stenosis. He had an aortic valve replacement with a Bjork Shiley valve in 1982.)  Rhythm:Regular Rate:Normal  Echo 09/10/16: Study Conclusions  - Left ventricle: The cavity size was normal. There was mild focal basal hypertrophy of the septum. Systolic function was normal. The estimated ejection fraction was in the range of 55% to 60%. - Aortic valve: Mechanical AVR not well seen No peri valvualr regurgitation. mean gradient gone from 15 to 17 mmHg peak has gone from 31 mmHg to 41 mmgh. - Aorta: Moderate aortic root enlargement not well seen consider CTA. - Mitral valve: Calcified annulus. Mildly thickened leaflets . - Atrial septum: No defect or patent foramen ovale was identified. - Pulmonary arteries: PA peak pressure: 37 mm Hg (S).  [place] 11/19/16: 1. 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 Moderate LAD stenosis 3. Patent left main and LCx 4. Normal right heart pressures   Neuro/Psych negative neurological ROS  negative psych ROS   GI/Hepatic negative GI ROS, Neg liver ROS,   Endo/Other  negative endocrine ROS  Renal/GU negative Renal ROS     Musculoskeletal  (+) Arthritis , Osteoarthritis,    Abdominal   Peds  Hematology  (+) Blood dyscrasia (Thrombocytopenia), ,   Anesthesia Other Findings Day of surgery medications reviewed with the patient.  Reproductive/Obstetrics                            Anesthesia Physical Anesthesia Plan  ASA: IV  Anesthesia Plan: General   Post-op Pain Management:    Induction: Intravenous  PONV Risk Score and Plan: 2 and Ondansetron and Treatment may vary due to age or medical condition  Airway Management Planned: Oral ETT  Additional Equipment: Arterial line, CVP, PA Cath, TEE and Ultrasound Guidance Line Placement  Intra-op Plan: Delibrate Circulatory arrest per surgeon request  Post-operative Plan: Post-operative intubation/ventilation  Informed Consent:   Plan Discussed with:   Anesthesia Plan Comments:        Anesthesia Quick Evaluation

## 2016-11-21 ENCOUNTER — Encounter (HOSPITAL_COMMUNITY): Payer: Self-pay

## 2016-11-21 ENCOUNTER — Ambulatory Visit (HOSPITAL_COMMUNITY): Payer: Medicare Other

## 2016-11-21 ENCOUNTER — Inpatient Hospital Stay (HOSPITAL_COMMUNITY): Payer: Medicare Other | Admitting: Certified Registered"

## 2016-11-21 ENCOUNTER — Inpatient Hospital Stay (HOSPITAL_COMMUNITY)
Admission: RE | Admit: 2016-11-21 | Discharge: 2016-11-30 | DRG: 217 | Disposition: A | Discharge status: Home / Self Care | Payer: Medicare Other | Source: Ambulatory Visit | Attending: Thoracic Surgery (Cardiothoracic Vascular Surgery) | Admitting: Thoracic Surgery (Cardiothoracic Vascular Surgery)

## 2016-11-21 ENCOUNTER — Inpatient Hospital Stay (HOSPITAL_COMMUNITY)
Admission: RE | Disposition: A | Discharge status: Home / Self Care | Payer: Self-pay | Source: Ambulatory Visit | Attending: Thoracic Surgery (Cardiothoracic Vascular Surgery)

## 2016-11-21 ENCOUNTER — Inpatient Hospital Stay (HOSPITAL_COMMUNITY): Payer: Medicare Other

## 2016-11-21 DIAGNOSIS — D689 Coagulation defect, unspecified: Secondary | ICD-10-CM | POA: Diagnosis present

## 2016-11-21 DIAGNOSIS — Z833 Family history of diabetes mellitus: Secondary | ICD-10-CM | POA: Diagnosis not present

## 2016-11-21 DIAGNOSIS — I442 Atrioventricular block, complete: Secondary | ICD-10-CM | POA: Diagnosis present

## 2016-11-21 DIAGNOSIS — D62 Acute posthemorrhagic anemia: Secondary | ICD-10-CM | POA: Diagnosis not present

## 2016-11-21 DIAGNOSIS — M1990 Unspecified osteoarthritis, unspecified site: Secondary | ICD-10-CM | POA: Diagnosis present

## 2016-11-21 DIAGNOSIS — J9811 Atelectasis: Secondary | ICD-10-CM | POA: Diagnosis not present

## 2016-11-21 DIAGNOSIS — Z8349 Family history of other endocrine, nutritional and metabolic diseases: Secondary | ICD-10-CM

## 2016-11-21 DIAGNOSIS — Z7901 Long term (current) use of anticoagulants: Secondary | ICD-10-CM

## 2016-11-21 DIAGNOSIS — Z951 Presence of aortocoronary bypass graft: Secondary | ICD-10-CM

## 2016-11-21 DIAGNOSIS — Z952 Presence of prosthetic heart valve: Secondary | ICD-10-CM

## 2016-11-21 DIAGNOSIS — I350 Nonrheumatic aortic (valve) stenosis: Secondary | ICD-10-CM | POA: Diagnosis present

## 2016-11-21 DIAGNOSIS — I2584 Coronary atherosclerosis due to calcified coronary lesion: Secondary | ICD-10-CM | POA: Diagnosis present

## 2016-11-21 DIAGNOSIS — M109 Gout, unspecified: Secondary | ICD-10-CM | POA: Diagnosis present

## 2016-11-21 DIAGNOSIS — M7989 Other specified soft tissue disorders: Secondary | ICD-10-CM | POA: Diagnosis not present

## 2016-11-21 DIAGNOSIS — I7121 Aneurysm of the ascending aorta, without rupture: Secondary | ICD-10-CM

## 2016-11-21 DIAGNOSIS — Z800 Family history of malignant neoplasm of digestive organs: Secondary | ICD-10-CM

## 2016-11-21 DIAGNOSIS — D696 Thrombocytopenia, unspecified: Secondary | ICD-10-CM | POA: Diagnosis not present

## 2016-11-21 DIAGNOSIS — Z825 Family history of asthma and other chronic lower respiratory diseases: Secondary | ICD-10-CM

## 2016-11-21 DIAGNOSIS — I2510 Atherosclerotic heart disease of native coronary artery without angina pectoris: Secondary | ICD-10-CM | POA: Diagnosis present

## 2016-11-21 DIAGNOSIS — I712 Thoracic aortic aneurysm, without rupture: Principal | ICD-10-CM | POA: Diagnosis present

## 2016-11-21 DIAGNOSIS — E785 Hyperlipidemia, unspecified: Secondary | ICD-10-CM | POA: Diagnosis present

## 2016-11-21 DIAGNOSIS — E8770 Fluid overload, unspecified: Secondary | ICD-10-CM | POA: Diagnosis not present

## 2016-11-21 DIAGNOSIS — I10 Essential (primary) hypertension: Secondary | ICD-10-CM | POA: Diagnosis present

## 2016-11-21 DIAGNOSIS — Z959 Presence of cardiac and vascular implant and graft, unspecified: Secondary | ICD-10-CM

## 2016-11-21 HISTORY — PX: CORONARY ARTERY BYPASS GRAFT: SHX141

## 2016-11-21 HISTORY — PX: TEE WITHOUT CARDIOVERSION: SHX5443

## 2016-11-21 HISTORY — PX: THORACIC AORTIC ANEURYSM REPAIR: SHX799

## 2016-11-21 HISTORY — PX: AORTIC VALVE REPLACEMENT: SHX41

## 2016-11-21 LAB — POCT I-STAT, CHEM 8
BUN: 17 mg/dL (ref 6–20)
BUN: 14 mg/dL (ref 6–20)
BUN: 17 mg/dL (ref 6–20)
BUN: 16 mg/dL (ref 6–20)
BUN: 16 mg/dL (ref 6–20)
BUN: 16 mg/dL (ref 6–20)
BUN: 13 mg/dL (ref 6–20)
BUN: 15 mg/dL (ref 6–20)
BUN: 13 mg/dL (ref 6–20)
BUN: 12 mg/dL (ref 6–20)
CALCIUM ION: 1.25 mmol/L (ref 1.15–1.40)
CALCIUM ION: 0.9 mmol/L — AB (ref 1.15–1.40)
CALCIUM ION: 0.9 mmol/L — AB (ref 1.15–1.40)
CALCIUM ION: 1 mmol/L — AB (ref 1.15–1.40)
CALCIUM ION: 1.02 mmol/L — AB (ref 1.15–1.40)
CHLORIDE: 99 mmol/L — AB (ref 101–111)
CHLORIDE: 100 mmol/L — AB (ref 101–111)
CHLORIDE: 99 mmol/L — AB (ref 101–111)
CHLORIDE: 99 mmol/L — AB (ref 101–111)
CHLORIDE: 104 mmol/L (ref 101–111)
CHLORIDE: 103 mmol/L (ref 101–111)
CREATININE: 0.6 mg/dL — AB (ref 0.61–1.24)
CREATININE: 0.5 mg/dL — AB (ref 0.61–1.24)
CREATININE: 0.6 mg/dL — AB (ref 0.61–1.24)
CREATININE: 0.5 mg/dL — AB (ref 0.61–1.24)
CREATININE: 0.6 mg/dL — AB (ref 0.61–1.24)
CREATININE: 0.8 mg/dL (ref 0.61–1.24)
Calcium, Ion: 0.96 mmol/L — ABNORMAL LOW (ref 1.15–1.40)
Calcium, Ion: 0.95 mmol/L — ABNORMAL LOW (ref 1.15–1.40)
Calcium, Ion: 0.89 mmol/L — CL (ref 1.15–1.40)
Calcium, Ion: 0.87 mmol/L — CL (ref 1.15–1.40)
Calcium, Ion: 0.79 mmol/L — CL (ref 1.15–1.40)
Chloride: 100 mmol/L — ABNORMAL LOW (ref 101–111)
Chloride: 95 mmol/L — ABNORMAL LOW (ref 101–111)
Chloride: 97 mmol/L — ABNORMAL LOW (ref 101–111)
Chloride: 101 mmol/L (ref 101–111)
Creatinine, Ser: 0.6 mg/dL — ABNORMAL LOW (ref 0.61–1.24)
Creatinine, Ser: 0.5 mg/dL — ABNORMAL LOW (ref 0.61–1.24)
Creatinine, Ser: 0.5 mg/dL — ABNORMAL LOW (ref 0.61–1.24)
Creatinine, Ser: 0.8 mg/dL (ref 0.61–1.24)
GLUCOSE: 111 mg/dL — AB (ref 65–99)
GLUCOSE: 145 mg/dL — AB (ref 65–99)
GLUCOSE: 145 mg/dL — AB (ref 65–99)
GLUCOSE: 131 mg/dL — AB (ref 65–99)
GLUCOSE: 139 mg/dL — AB (ref 65–99)
Glucose, Bld: 105 mg/dL — ABNORMAL HIGH (ref 65–99)
Glucose, Bld: 131 mg/dL — ABNORMAL HIGH (ref 65–99)
Glucose, Bld: 139 mg/dL — ABNORMAL HIGH (ref 65–99)
Glucose, Bld: 165 mg/dL — ABNORMAL HIGH (ref 65–99)
Glucose, Bld: 119 mg/dL — ABNORMAL HIGH (ref 65–99)
HCT: 34 % — ABNORMAL LOW (ref 39.0–52.0)
HCT: 21 % — ABNORMAL LOW (ref 39.0–52.0)
HCT: 22 % — ABNORMAL LOW (ref 39.0–52.0)
HCT: 27 % — ABNORMAL LOW (ref 39.0–52.0)
HEMATOCRIT: 25 % — AB (ref 39.0–52.0)
HEMATOCRIT: 26 % — AB (ref 39.0–52.0)
HEMATOCRIT: 22 % — AB (ref 39.0–52.0)
HEMATOCRIT: 22 % — AB (ref 39.0–52.0)
HEMATOCRIT: 20 % — AB (ref 39.0–52.0)
HEMATOCRIT: 20 % — AB (ref 39.0–52.0)
HEMOGLOBIN: 11.6 g/dL — AB (ref 13.0–17.0)
HEMOGLOBIN: 8.5 g/dL — AB (ref 13.0–17.0)
HEMOGLOBIN: 8.8 g/dL — AB (ref 13.0–17.0)
HEMOGLOBIN: 7.5 g/dL — AB (ref 13.0–17.0)
HEMOGLOBIN: 7.5 g/dL — AB (ref 13.0–17.0)
Hemoglobin: 7.5 g/dL — ABNORMAL LOW (ref 13.0–17.0)
Hemoglobin: 7.1 g/dL — ABNORMAL LOW (ref 13.0–17.0)
Hemoglobin: 6.8 g/dL — CL (ref 13.0–17.0)
Hemoglobin: 6.8 g/dL — CL (ref 13.0–17.0)
Hemoglobin: 9.2 g/dL — ABNORMAL LOW (ref 13.0–17.0)
POTASSIUM: 6.2 mmol/L — AB (ref 3.5–5.1)
POTASSIUM: 5.7 mmol/L — AB (ref 3.5–5.1)
Potassium: 4.2 mmol/L (ref 3.5–5.1)
Potassium: 4.3 mmol/L (ref 3.5–5.1)
Potassium: 5.4 mmol/L — ABNORMAL HIGH (ref 3.5–5.1)
Potassium: 5.8 mmol/L — ABNORMAL HIGH (ref 3.5–5.1)
Potassium: 4 mmol/L (ref 3.5–5.1)
Potassium: 4.4 mmol/L (ref 3.5–5.1)
Potassium: 4.2 mmol/L (ref 3.5–5.1)
Potassium: 3.9 mmol/L (ref 3.5–5.1)
SODIUM: 134 mmol/L — AB (ref 135–145)
SODIUM: 136 mmol/L (ref 135–145)
SODIUM: 136 mmol/L (ref 135–145)
SODIUM: 139 mmol/L (ref 135–145)
SODIUM: 141 mmol/L (ref 135–145)
Sodium: 139 mmol/L (ref 135–145)
Sodium: 135 mmol/L (ref 135–145)
Sodium: 135 mmol/L (ref 135–145)
Sodium: 141 mmol/L (ref 135–145)
Sodium: 141 mmol/L (ref 135–145)
TCO2: 28 mmol/L (ref 0–100)
TCO2: 26 mmol/L (ref 0–100)
TCO2: 28 mmol/L (ref 0–100)
TCO2: 28 mmol/L (ref 0–100)
TCO2: 27 mmol/L (ref 0–100)
TCO2: 27 mmol/L (ref 0–100)
TCO2: 23 mmol/L (ref 0–100)
TCO2: 24 mmol/L (ref 0–100)
TCO2: 24 mmol/L (ref 0–100)
TCO2: 21 mmol/L (ref 0–100)

## 2016-11-21 LAB — POCT I-STAT 3, ART BLOOD GAS (G3+)
ACID-BASE DEFICIT: 2 mmol/L (ref 0.0–2.0)
ACID-BASE EXCESS: 2 mmol/L (ref 0.0–2.0)
ACID-BASE EXCESS: 3 mmol/L — AB (ref 0.0–2.0)
Acid-Base Excess: 6 mmol/L — ABNORMAL HIGH (ref 0.0–2.0)
Acid-Base Excess: 4 mmol/L — ABNORMAL HIGH (ref 0.0–2.0)
BICARBONATE: 26.5 mmol/L (ref 20.0–28.0)
BICARBONATE: 27.4 mmol/L (ref 20.0–28.0)
BICARBONATE: 22.7 mmol/L (ref 20.0–28.0)
Bicarbonate: 26.9 mmol/L (ref 20.0–28.0)
Bicarbonate: 29.9 mmol/L — ABNORMAL HIGH (ref 20.0–28.0)
Bicarbonate: 23.8 mmol/L (ref 20.0–28.0)
Bicarbonate: 21.8 mmol/L (ref 20.0–28.0)
O2 SAT: 100 %
O2 SAT: 98 %
O2 Saturation: 100 %
O2 Saturation: 100 %
O2 Saturation: 100 %
O2 Saturation: 100 %
O2 Saturation: 98 %
PCO2 ART: 40.4 mmHg (ref 32.0–48.0)
PCO2 ART: 33.2 mmHg (ref 32.0–48.0)
PCO2 ART: 36 mmHg (ref 32.0–48.0)
PCO2 ART: 27.5 mmHg — AB (ref 32.0–48.0)
PH ART: 7.47 — AB (ref 7.350–7.450)
PH ART: 7.524 — AB (ref 7.350–7.450)
PH ART: 7.498 — AB (ref 7.350–7.450)
PH ART: 7.503 — AB (ref 7.350–7.450)
PO2 ART: 329 mmHg — AB (ref 83.0–108.0)
PO2 ART: 423 mmHg — AB (ref 83.0–108.0)
PO2 ART: 226 mmHg — AB (ref 83.0–108.0)
Patient temperature: 37.2
TCO2: 28 mmol/L (ref 0–100)
TCO2: 28 mmol/L (ref 0–100)
TCO2: 31 mmol/L (ref 0–100)
TCO2: 28 mmol/L (ref 0–100)
TCO2: 25 mmol/L (ref 0–100)
TCO2: 23 mmol/L (ref 0–100)
TCO2: 24 mmol/L (ref 0–100)
pCO2 arterial: 40.9 mmHg (ref 32.0–48.0)
pCO2 arterial: 36.9 mmHg (ref 32.0–48.0)
pCO2 arterial: 28.9 mmHg — ABNORMAL LOW (ref 32.0–48.0)
pH, Arterial: 7.419 (ref 7.350–7.450)
pH, Arterial: 7.477 — ABNORMAL HIGH (ref 7.350–7.450)
pH, Arterial: 7.429 (ref 7.350–7.450)
pO2, Arterial: 381 mmHg — ABNORMAL HIGH (ref 83.0–108.0)
pO2, Arterial: 310 mmHg — ABNORMAL HIGH (ref 83.0–108.0)
pO2, Arterial: 89 mmHg (ref 83.0–108.0)
pO2, Arterial: 89 mmHg (ref 83.0–108.0)

## 2016-11-21 LAB — GLUCOSE, CAPILLARY
GLUCOSE-CAPILLARY: 140 mg/dL — AB (ref 65–99)
Glucose-Capillary: 139 mg/dL — ABNORMAL HIGH (ref 65–99)
Glucose-Capillary: 123 mg/dL — ABNORMAL HIGH (ref 65–99)
Glucose-Capillary: 117 mg/dL — ABNORMAL HIGH (ref 65–99)
Glucose-Capillary: 161 mg/dL — ABNORMAL HIGH (ref 65–99)
Glucose-Capillary: 138 mg/dL — ABNORMAL HIGH (ref 65–99)

## 2016-11-21 LAB — CBC
HCT: 31.7 % — ABNORMAL LOW (ref 39.0–52.0)
HCT: 29.6 % — ABNORMAL LOW (ref 39.0–52.0)
HEMATOCRIT: 22.6 % — AB (ref 39.0–52.0)
HEMOGLOBIN: 7.7 g/dL — AB (ref 13.0–17.0)
HEMOGLOBIN: 10.1 g/dL — AB (ref 13.0–17.0)
Hemoglobin: 10.7 g/dL — ABNORMAL LOW (ref 13.0–17.0)
MCH: 31 pg (ref 26.0–34.0)
MCH: 31.3 pg (ref 26.0–34.0)
MCH: 29.7 pg (ref 26.0–34.0)
MCHC: 33.8 g/dL (ref 30.0–36.0)
MCHC: 34.1 g/dL (ref 30.0–36.0)
MCHC: 34.1 g/dL (ref 30.0–36.0)
MCV: 91.9 fL (ref 78.0–100.0)
MCV: 91.9 fL (ref 78.0–100.0)
MCV: 87.1 fL (ref 78.0–100.0)
PLATELETS: 66 10*3/uL — AB (ref 150–400)
Platelets: 59 10*3/uL — ABNORMAL LOW (ref 150–400)
Platelets: 95 10*3/uL — ABNORMAL LOW (ref 150–400)
RBC: 3.45 MIL/uL — ABNORMAL LOW (ref 4.22–5.81)
RBC: 2.46 MIL/uL — ABNORMAL LOW (ref 4.22–5.81)
RBC: 3.4 MIL/uL — AB (ref 4.22–5.81)
RDW: 14.4 % (ref 11.5–15.5)
RDW: 14.5 % (ref 11.5–15.5)
RDW: 14.4 % (ref 11.5–15.5)
WBC: 14.5 10*3/uL — ABNORMAL HIGH (ref 4.0–10.5)
WBC: 8.8 10*3/uL (ref 4.0–10.5)
WBC: 6.9 10*3/uL (ref 4.0–10.5)

## 2016-11-21 LAB — DIC (DISSEMINATED INTRAVASCULAR COAGULATION) PANEL
APTT: 50 s — AB (ref 24–36)
D DIMER QUANT: 1.14 ug{FEU}/mL — AB (ref 0.00–0.50)
INR: 1.33
PROTHROMBIN TIME: 16.6 s — AB (ref 11.4–15.2)

## 2016-11-21 LAB — CREATININE, SERUM
CREATININE: 0.83 mg/dL (ref 0.61–1.24)
GFR calc Af Amer: >60 mL/min (ref >60)
GFR calc non Af Amer: >60 mL/min (ref >60)

## 2016-11-21 LAB — POCT I-STAT 4, (NA,K, GLUC, HGB,HCT)
GLUCOSE: 122 mg/dL — AB (ref 65–99)
HCT: 29 % — ABNORMAL LOW (ref 39.0–52.0)
HEMOGLOBIN: 9.9 g/dL — AB (ref 13.0–17.0)
POTASSIUM: 4.1 mmol/L (ref 3.5–5.1)
Sodium: 140 mmol/L (ref 135–145)

## 2016-11-21 LAB — PREPARE RBC (CROSSMATCH)

## 2016-11-21 LAB — PLATELET COUNT: Platelets: 63 10*3/uL — ABNORMAL LOW (ref 150–400)

## 2016-11-21 LAB — MAGNESIUM: Magnesium: 2.3 mg/dL (ref 1.7–2.4)

## 2016-11-21 LAB — PROTIME-INR
INR: 1.87
PROTHROMBIN TIME: 21.8 s — AB (ref 11.4–15.2)

## 2016-11-21 LAB — HEMOGLOBIN AND HEMATOCRIT, BLOOD
HCT: 22 % — ABNORMAL LOW (ref 39.0–52.0)
HEMOGLOBIN: 7.5 g/dL — AB (ref 13.0–17.0)

## 2016-11-21 LAB — DIC (DISSEMINATED INTRAVASCULAR COAGULATION)PANEL
Fibrinogen: 158 mg/dL — ABNORMAL LOW (ref 210–475)
Platelets: 58 10*3/uL — ABNORMAL LOW (ref 150–400)
Smear Review: NONE SEEN

## 2016-11-21 LAB — FIBRINOGEN: FIBRINOGEN: 160 mg/dL — AB (ref 210–475)

## 2016-11-21 LAB — APTT: aPTT: 45 seconds — ABNORMAL HIGH (ref 24–36)

## 2016-11-21 SURGERY — REDO STERNOTOMY
Anesthesia: General | Site: Chest

## 2016-11-21 MED ORDER — DOPAMINE-DEXTROSE 3.2-5 MG/ML-% IV SOLN: 3.0000 ug/kg/min | INTRAVENOUS | Status: DC

## 2016-11-21 MED ORDER — LACTATED RINGERS IV SOLN
500.0000 mL | Freq: Once | INTRAVENOUS | Status: AC | PRN
  Administered 2016-11-21: 500 mL via INTRAVENOUS

## 2016-11-21 MED ORDER — LACTATED RINGERS IV SOLN
INTRAVENOUS | Status: DC
  Administered 2016-11-21: 21:00:00 via INTRAVENOUS

## 2016-11-21 MED ORDER — SODIUM CHLORIDE 0.9 % IV SOLN: Freq: Once | INTRAVENOUS | Status: AC

## 2016-11-21 MED ORDER — ORAL CARE MOUTH RINSE
15.0000 mL | OROMUCOSAL | Status: DC
  Administered 2016-11-21 – 2016-11-22 (×2): 15 mL via OROMUCOSAL

## 2016-11-21 MED ORDER — METOPROLOL TARTRATE 12.5 MG HALF TABLET: 12.5000 mg | ORAL_TABLET | Freq: Two times a day (BID) | ORAL | Status: DC

## 2016-11-21 MED ORDER — SODIUM CHLORIDE 0.9 % IV SOLN
Freq: Once | INTRAVENOUS | Status: AC
  Administered 2016-11-21: 19:00:00 via INTRAVENOUS

## 2016-11-21 MED ORDER — DOPAMINE-DEXTROSE 3.2-5 MG/ML-% IV SOLN
INTRAVENOUS | Status: DC | PRN
  Administered 2016-11-21: 3 ug/kg/min via INTRAVENOUS

## 2016-11-21 MED ORDER — MAGNESIUM SULFATE 4 GM/100ML IV SOLN
4.0000 g | Freq: Once | INTRAVENOUS | Status: AC
  Administered 2016-11-21: 4 g via INTRAVENOUS
  Filled 2016-11-21: qty 100

## 2016-11-21 MED ORDER — CHLORHEXIDINE GLUCONATE CLOTH 2 % EX PADS
6.0000 | MEDICATED_PAD | Freq: Every day | CUTANEOUS | Status: DC
  Administered 2016-11-22 – 2016-11-24 (×4): 6 via TOPICAL

## 2016-11-21 MED ORDER — CHLORHEXIDINE GLUCONATE 0.12 % MT SOLN
15.0000 mL | OROMUCOSAL | Status: AC
  Administered 2016-11-21: 15 mL via OROMUCOSAL

## 2016-11-21 MED ORDER — HEPARIN SODIUM (PORCINE) 1000 UNIT/ML IJ SOLN
INTRAMUSCULAR | Status: DC | PRN
  Administered 2016-11-21: 2000 [IU] via INTRAVENOUS
  Administered 2016-11-21: 23000 [IU] via INTRAVENOUS

## 2016-11-21 MED ORDER — PROTAMINE SULFATE 10 MG/ML IV SOLN
INTRAVENOUS | Status: DC | PRN
  Administered 2016-11-21: 50 mg via INTRAVENOUS
  Administered 2016-11-21: 100 mg via INTRAVENOUS
  Administered 2016-11-21: 25 mg via INTRAVENOUS
  Administered 2016-11-21: 100 mg via INTRAVENOUS

## 2016-11-21 MED ORDER — SODIUM CHLORIDE 0.9 % IV SOLN: INTRAVENOUS | Status: DC

## 2016-11-21 MED ORDER — MORPHINE SULFATE (PF) 2 MG/ML IV SOLN: 1.0000 mg | INTRAVENOUS | Status: DC | PRN

## 2016-11-21 MED ORDER — SODIUM CHLORIDE 0.9 % IV SOLN
INTRAVENOUS | Status: DC
  Administered 2016-11-21: 2.5 [IU]/h via INTRAVENOUS
  Administered 2016-11-21: 3.2 [IU]/h via INTRAVENOUS
  Filled 2016-11-21: qty 1

## 2016-11-21 MED ORDER — LACTATED RINGERS IV SOLN
INTRAVENOUS | Status: DC | PRN
  Administered 2016-11-21: 08:00:00 via INTRAVENOUS

## 2016-11-21 MED ORDER — MIDAZOLAM HCL 2 MG/2ML IJ SOLN
2.0000 mg | INTRAMUSCULAR | Status: DC | PRN
  Filled 2016-11-21: qty 2

## 2016-11-21 MED ORDER — MIDAZOLAM HCL 10 MG/2ML IJ SOLN
INTRAMUSCULAR | Status: AC
  Filled 2016-11-21: qty 2

## 2016-11-21 MED ORDER — METOPROLOL TARTRATE 25 MG/10 ML ORAL SUSPENSION: 12.5000 mg | Freq: Two times a day (BID) | ORAL | Status: DC

## 2016-11-21 MED ORDER — ACETAMINOPHEN 160 MG/5ML PO SOLN: 650.0000 mg | Freq: Once | ORAL | Status: AC

## 2016-11-21 MED ORDER — EZETIMIBE 10 MG PO TABS
5.0000 mg | ORAL_TABLET | Freq: Every day | ORAL | Status: DC
  Administered 2016-11-22 – 2016-11-30 (×8): 5 mg via ORAL
  Filled 2016-11-21 (×9): qty 1

## 2016-11-21 MED ORDER — MORPHINE SULFATE (PF) 4 MG/ML IV SOLN: 2.0000 mg | INTRAVENOUS | Status: DC | PRN

## 2016-11-21 MED ORDER — ACETAMINOPHEN 500 MG PO TABS
1000.0000 mg | ORAL_TABLET | Freq: Four times a day (QID) | ORAL | Status: AC
  Administered 2016-11-22 – 2016-11-26 (×18): 1000 mg via ORAL
  Filled 2016-11-21 (×18): qty 2

## 2016-11-21 MED ORDER — ASPIRIN EC 325 MG PO TBEC
325.0000 mg | DELAYED_RELEASE_TABLET | Freq: Every day | ORAL | Status: DC
  Administered 2016-11-22: 325 mg via ORAL
  Filled 2016-11-21: qty 1

## 2016-11-21 MED ORDER — ALBUMIN HUMAN 5 % IV SOLN
12.5000 g | Freq: Two times a day (BID) | INTRAVENOUS | Status: DC | PRN
Start: 2016-11-21 — End: 2016-11-27

## 2016-11-21 MED ORDER — OXYCODONE HCL 5 MG PO TABS: 5.0000 mg | ORAL_TABLET | ORAL | Status: DC | PRN

## 2016-11-21 MED ORDER — SODIUM CHLORIDE 0.9% FLUSH
10.0000 mL | Freq: Two times a day (BID) | INTRAVENOUS | Status: DC
  Administered 2016-11-21 – 2016-11-25 (×8): 10 mL

## 2016-11-21 MED ORDER — SODIUM CHLORIDE 0.9% FLUSH: 10.0000 mL | INTRAVENOUS | Status: DC | PRN

## 2016-11-21 MED ORDER — LIDOCAINE HCL (CARDIAC) 20 MG/ML IV SOLN
INTRAVENOUS | Status: DC | PRN
  Administered 2016-11-21: 80 mg via INTRAVENOUS

## 2016-11-21 MED ORDER — ALBUMIN HUMAN 5 % IV SOLN
INTRAVENOUS | Status: DC | PRN
  Administered 2016-11-21: 14:00:00 via INTRAVENOUS

## 2016-11-21 MED ORDER — ASPIRIN 81 MG PO CHEW: 324.0000 mg | CHEWABLE_TABLET | Freq: Every day | ORAL | Status: DC

## 2016-11-21 MED ORDER — DEXTROSE 5 % IV SOLN
1.5000 g | Freq: Two times a day (BID) | INTRAVENOUS | Status: AC
  Administered 2016-11-21 – 2016-11-23 (×4): 1.5 g via INTRAVENOUS
  Filled 2016-11-21 (×4): qty 1.5

## 2016-11-21 MED ORDER — BISACODYL 5 MG PO TBEC
10.0000 mg | DELAYED_RELEASE_TABLET | Freq: Every day | ORAL | Status: DC
  Administered 2016-11-22 – 2016-11-25 (×4): 10 mg via ORAL
  Filled 2016-11-21 (×6): qty 2

## 2016-11-21 MED ORDER — ACETAMINOPHEN 160 MG/5ML PO SOLN
1000.0000 mg | Freq: Four times a day (QID) | ORAL | Status: AC
  Administered 2016-11-21: 1000 mg
  Filled 2016-11-21: qty 40.6

## 2016-11-21 MED ORDER — 0.9 % SODIUM CHLORIDE (POUR BTL) OPTIME
TOPICAL | Status: DC | PRN
  Administered 2016-11-21: 1000 mL
  Administered 2016-11-21: 6000 mL

## 2016-11-21 MED ORDER — MORPHINE SULFATE (PF) 4 MG/ML IV SOLN
1.0000 mg | INTRAVENOUS | Status: AC | PRN
  Filled 2016-11-21: qty 1

## 2016-11-21 MED ORDER — MORPHINE SULFATE (PF) 4 MG/ML IV SOLN: 1.0000 mg | INTRAVENOUS | Status: DC | PRN

## 2016-11-21 MED ORDER — TRAMADOL HCL 50 MG PO TABS
50.0000 mg | ORAL_TABLET | ORAL | Status: DC | PRN
  Administered 2016-11-22: 50 mg via ORAL
  Administered 2016-11-23: 100 mg via ORAL
  Filled 2016-11-21: qty 2
  Filled 2016-11-21: qty 1

## 2016-11-21 MED ORDER — SODIUM CHLORIDE 0.9 % IV SOLN
1.5000 mg/kg/h | INTRAVENOUS | Status: DC
  Filled 2016-11-21 (×2): qty 25

## 2016-11-21 MED ORDER — ONDANSETRON HCL 4 MG/2ML IJ SOLN: 4.0000 mg | Freq: Four times a day (QID) | INTRAMUSCULAR | Status: DC | PRN

## 2016-11-21 MED ORDER — POTASSIUM CHLORIDE 10 MEQ/50ML IV SOLN: 10.0000 meq | INTRAVENOUS | Status: AC

## 2016-11-21 MED ORDER — ACETAMINOPHEN 650 MG RE SUPP
650.0000 mg | Freq: Once | RECTAL | Status: AC
  Administered 2016-11-21: 650 mg via RECTAL

## 2016-11-21 MED ORDER — LACTATED RINGERS IV SOLN: INTRAVENOUS | Status: DC

## 2016-11-21 MED ORDER — ALBUMIN HUMAN 5 % IV SOLN
250.0000 mL | INTRAVENOUS | Status: AC | PRN
  Administered 2016-11-21 (×4): 250 mL via INTRAVENOUS
  Filled 2016-11-21 (×2): qty 250

## 2016-11-21 MED ORDER — HEMOSTATIC AGENTS (NO CHARGE) OPTIME
TOPICAL | Status: DC | PRN
  Administered 2016-11-21 (×3): 1 via TOPICAL

## 2016-11-21 MED ORDER — PHENYLEPHRINE 40 MCG/ML (10ML) SYRINGE FOR IV PUSH (FOR BLOOD PRESSURE SUPPORT)
PREFILLED_SYRINGE | INTRAVENOUS | Status: DC | PRN
  Administered 2016-11-21 (×3): 40 ug via INTRAVENOUS
  Administered 2016-11-21 (×2): 160 ug via INTRAVENOUS

## 2016-11-21 MED ORDER — SODIUM CHLORIDE 0.9 % IV SOLN
INTRAVENOUS | Status: DC | PRN
  Administered 2016-11-21: 1.5 mg/kg/h via INTRAVENOUS

## 2016-11-21 MED ORDER — INSULIN REGULAR BOLUS VIA INFUSION
0.0000 [IU] | Freq: Three times a day (TID) | INTRAVENOUS | Status: DC
  Administered 2016-11-22: 2 [IU] via INTRAVENOUS
  Administered 2016-11-22: 3 [IU] via INTRAVENOUS
  Filled 2016-11-21: qty 10

## 2016-11-21 MED ORDER — BISACODYL 10 MG RE SUPP: 10.0000 mg | Freq: Every day | RECTAL | Status: DC

## 2016-11-21 MED ORDER — SODIUM CHLORIDE 0.9% FLUSH
3.0000 mL | INTRAVENOUS | Status: DC | PRN
Start: 2016-11-22 — End: 2016-11-27

## 2016-11-21 MED ORDER — SODIUM CHLORIDE 0.9 % IJ SOLN
OROMUCOSAL | Status: DC | PRN
  Administered 2016-11-21 (×4): via TOPICAL

## 2016-11-21 MED ORDER — SODIUM CHLORIDE 0.45 % IV SOLN: INTRAVENOUS | Status: DC | PRN

## 2016-11-21 MED ORDER — MIDAZOLAM HCL 5 MG/5ML IJ SOLN
INTRAMUSCULAR | Status: DC | PRN
  Administered 2016-11-21: 2 mg via INTRAVENOUS
  Administered 2016-11-21: 6 mg via INTRAVENOUS
  Administered 2016-11-21: 2 mg via INTRAVENOUS

## 2016-11-21 MED ORDER — ROCURONIUM BROMIDE 10 MG/ML (PF) SYRINGE
PREFILLED_SYRINGE | INTRAVENOUS | Status: DC | PRN
  Administered 2016-11-21: 50 mg via INTRAVENOUS
  Administered 2016-11-21: 100 mg via INTRAVENOUS
  Administered 2016-11-21 (×4): 50 mg via INTRAVENOUS

## 2016-11-21 MED ORDER — FAMOTIDINE IN NACL 20-0.9 MG/50ML-% IV SOLN
20.0000 mg | Freq: Two times a day (BID) | INTRAVENOUS | Status: AC
  Administered 2016-11-21: 20 mg via INTRAVENOUS
  Filled 2016-11-21: qty 50

## 2016-11-21 MED ORDER — VANCOMYCIN HCL IN DEXTROSE 1-5 GM/200ML-% IV SOLN
1000.0000 mg | Freq: Once | INTRAVENOUS | Status: AC
  Administered 2016-11-21: 1000 mg via INTRAVENOUS
  Filled 2016-11-21: qty 200

## 2016-11-21 MED ORDER — CALCIUM CHLORIDE 10 % IV SOLN
1.0000 g | Freq: Once | INTRAVENOUS | Status: AC
  Administered 2016-11-21: 1 g via INTRAVENOUS

## 2016-11-21 MED ORDER — CHLORHEXIDINE GLUCONATE 4 % EX LIQD: 30.0000 mL | CUTANEOUS | Status: DC

## 2016-11-21 MED ORDER — ALBUMIN HUMAN 5 % IV SOLN
12.5000 g | Freq: Once | INTRAVENOUS | Status: AC
  Administered 2016-11-21: 12.5 g via INTRAVENOUS

## 2016-11-21 MED ORDER — SODIUM CHLORIDE 0.9 % IV SOLN: 250.0000 mL | INTRAVENOUS | Status: DC

## 2016-11-21 MED ORDER — CHLORHEXIDINE GLUCONATE 0.12% ORAL RINSE (MEDLINE KIT)
15.0000 mL | Freq: Two times a day (BID) | OROMUCOSAL | Status: DC
  Administered 2016-11-21: 15 mL via OROMUCOSAL

## 2016-11-21 MED ORDER — NITROGLYCERIN IN D5W 200-5 MCG/ML-% IV SOLN: 0.0000 ug/min | INTRAVENOUS | Status: DC

## 2016-11-21 MED ORDER — ATORVASTATIN CALCIUM 10 MG PO TABS
10.0000 mg | ORAL_TABLET | Freq: Every day | ORAL | Status: DC
  Administered 2016-11-22 – 2016-11-29 (×8): 10 mg via ORAL
  Filled 2016-11-21 (×8): qty 1

## 2016-11-21 MED ORDER — LACTATED RINGERS IV SOLN
INTRAVENOUS | Status: DC | PRN
  Administered 2016-11-21 (×3): via INTRAVENOUS

## 2016-11-21 MED ORDER — ARTIFICIAL TEARS OPHTHALMIC OINT
TOPICAL_OINTMENT | OPHTHALMIC | Status: DC | PRN
  Administered 2016-11-21: 1 via OPHTHALMIC

## 2016-11-21 MED ORDER — PHENYLEPHRINE HCL 10 MG/ML IJ SOLN
0.0000 ug/min | INTRAMUSCULAR | Status: DC
  Administered 2016-11-21: 50 ug/min via INTRAVENOUS
  Administered 2016-11-22: 30 ug/min via INTRAVENOUS
  Filled 2016-11-21 (×2): qty 2

## 2016-11-21 MED ORDER — FENTANYL CITRATE (PF) 250 MCG/5ML IJ SOLN
INTRAMUSCULAR | Status: AC
  Filled 2016-11-21: qty 25

## 2016-11-21 MED ORDER — DOCUSATE SODIUM 100 MG PO CAPS
200.0000 mg | ORAL_CAPSULE | Freq: Every day | ORAL | Status: DC
  Administered 2016-11-22 – 2016-11-28 (×5): 200 mg via ORAL
  Filled 2016-11-21 (×7): qty 2

## 2016-11-21 MED ORDER — ALBUMIN HUMAN 5 % IV SOLN
INTRAVENOUS | Status: AC
  Administered 2016-11-21: 12.5 g
  Filled 2016-11-21: qty 500

## 2016-11-21 MED ORDER — PANTOPRAZOLE SODIUM 40 MG PO TBEC
40.0000 mg | DELAYED_RELEASE_TABLET | Freq: Every day | ORAL | Status: DC
  Administered 2016-11-23 – 2016-11-30 (×7): 40 mg via ORAL
  Filled 2016-11-21 (×7): qty 1

## 2016-11-21 MED ORDER — FENTANYL CITRATE (PF) 250 MCG/5ML IJ SOLN
INTRAMUSCULAR | Status: DC | PRN
  Administered 2016-11-21: 50 ug via INTRAVENOUS
  Administered 2016-11-21: 100 ug via INTRAVENOUS
  Administered 2016-11-21 (×3): 250 ug via INTRAVENOUS
  Administered 2016-11-21: 200 ug via INTRAVENOUS
  Administered 2016-11-21: 150 ug via INTRAVENOUS

## 2016-11-21 MED ORDER — PROPOFOL 10 MG/ML IV BOLUS
INTRAVENOUS | Status: AC
  Filled 2016-11-21: qty 20

## 2016-11-21 MED ORDER — PROTAMINE SULFATE 10 MG/ML IV SOLN
25.0000 mg | Freq: Once | INTRAVENOUS | Status: AC
  Administered 2016-11-21: 25 mg via INTRAVENOUS
  Filled 2016-11-21: qty 5

## 2016-11-21 MED ORDER — DEXMEDETOMIDINE HCL IN NACL 400 MCG/100ML IV SOLN
0.4000 ug/kg/h | INTRAVENOUS | Status: DC
  Administered 2016-11-21: 0.7 ug/kg/h via INTRAVENOUS
  Filled 2016-11-21 (×3): qty 100

## 2016-11-21 MED ORDER — SODIUM CHLORIDE 0.9% FLUSH
3.0000 mL | Freq: Two times a day (BID) | INTRAVENOUS | Status: DC
  Administered 2016-11-22: 10 mL via INTRAVENOUS
  Administered 2016-11-22: 3 mL via INTRAVENOUS
  Administered 2016-11-23: 8 mL via INTRAVENOUS
  Administered 2016-11-23 – 2016-11-27 (×6): 3 mL via INTRAVENOUS

## 2016-11-21 MED ORDER — MORPHINE SULFATE (PF) 2 MG/ML IV SOLN: 2.0000 mg | INTRAVENOUS | Status: DC | PRN

## 2016-11-21 MED ORDER — PROPOFOL 10 MG/ML IV BOLUS
INTRAVENOUS | Status: DC | PRN
  Administered 2016-11-21: 100 mg via INTRAVENOUS

## 2016-11-21 MED ORDER — METOPROLOL TARTRATE 5 MG/5ML IV SOLN: 2.5000 mg | INTRAVENOUS | Status: DC | PRN

## 2016-11-21 MED FILL — Heparin Sodium (Porcine) Inj 1000 Unit/ML: INTRAMUSCULAR | Qty: 30 | Status: AC

## 2016-11-21 MED FILL — Potassium Chloride Inj 2 mEq/ML: INTRAVENOUS | Qty: 10 | Status: AC

## 2016-11-21 MED FILL — Magnesium Sulfate Inj 50%: INTRAMUSCULAR | Qty: 10 | Status: AC

## 2016-11-21 SURGICAL SUPPLY — 132 items
ADAPTER CARDIO PERF ANTE/RETRO (ADAPTER) ×4 IMPLANT
ADPR PRFSN 84XANTGRD RTRGD (ADAPTER) ×2
BAG DECANTER FOR FLEXI CONT (MISCELLANEOUS) ×4 IMPLANT
BANDAGE ACE 4X5 VEL STRL LF (GAUZE/BANDAGES/DRESSINGS) ×2 IMPLANT
BANDAGE ACE 6X5 VEL STRL LF (GAUZE/BANDAGES/DRESSINGS) ×2 IMPLANT
BASKET HEART  (ORDER IN 25'S) (MISCELLANEOUS) ×1
BASKET HEART (ORDER IN 25'S) (MISCELLANEOUS) ×1
BASKET HEART (ORDER IN 25S) (MISCELLANEOUS) ×2 IMPLANT
BLADE CORE FAN STRYKER (BLADE) ×4 IMPLANT
BLADE SURG 11 STRL SS (BLADE) ×2 IMPLANT
BLADE SURG 15 STRL LF DISP TIS (BLADE) ×2 IMPLANT
BLADE SURG 15 STRL SS (BLADE) ×4
BNDG GAUZE ELAST 4 BULKY (GAUZE/BANDAGES/DRESSINGS) ×2 IMPLANT
CANISTER SUCT 3000ML PPV (MISCELLANEOUS) ×4 IMPLANT
CANNULA EZ GLIDE AORTIC 21FR (CANNULA) ×4 IMPLANT
CATH CPB KIT HENDRICKSON (MISCELLANEOUS) ×4 IMPLANT
CATH ROBINSON RED A/P 18FR (CATHETERS) ×8 IMPLANT
CATH THORACIC 28FR (CATHETERS) IMPLANT
CATH THORACIC 36FR (CATHETERS) ×4 IMPLANT
CATH THORACIC 36FR RT ANG (CATHETERS) ×4 IMPLANT
CAUTERY EYE LOW TEMP 1300F FIN (OPHTHALMIC RELATED) ×4 IMPLANT
CLIP FOGARTY SPRING 6M (CLIP) ×2 IMPLANT
CLIP VESOCCLUDE MED 24/CT (CLIP) IMPLANT
CLIP VESOCCLUDE MED 6/CT (CLIP) ×4 IMPLANT
CLIP VESOCCLUDE SM WIDE 24/CT (CLIP) ×6 IMPLANT
CONN 3/8X3/8 GISH STERILE (MISCELLANEOUS) IMPLANT
CONN Y 3/8X3/8X3/8  BEN (MISCELLANEOUS) ×2
CONN Y 3/8X3/8X3/8 BEN (MISCELLANEOUS) IMPLANT
CONT SPEC 4OZ CLIKSEAL STRL BL (MISCELLANEOUS) ×4 IMPLANT
COUNTER NEEDLE 20 DBL MAG RED (NEEDLE) ×2 IMPLANT
CRADLE DONUT ADULT HEAD (MISCELLANEOUS) ×4 IMPLANT
DRAIN CHANNEL 32F RND 10.7 FF (WOUND CARE) ×2 IMPLANT
DRAPE CARDIOVASCULAR INCISE (DRAPES) ×4
DRAPE SLUSH/WARMER DISC (DRAPES) ×4 IMPLANT
DRAPE SRG 135X102X78XABS (DRAPES) ×2 IMPLANT
DRSG COVADERM 4X14 (GAUZE/BANDAGES/DRESSINGS) ×2 IMPLANT
ELECT REM PT RETURN 9FT ADLT (ELECTROSURGICAL) ×8
ELECTRODE REM PT RTRN 9FT ADLT (ELECTROSURGICAL) ×4 IMPLANT
FELT TEFLON 1X6 (MISCELLANEOUS) ×8 IMPLANT
GAUZE SPONGE 4X4 12PLY STRL LF (GAUZE/BANDAGES/DRESSINGS) ×4 IMPLANT
GLOVE SURG SIGNA 7.5 PF LTX (GLOVE) ×18 IMPLANT
GOWN STRL REUS W/ TWL LRG LVL3 (GOWN DISPOSABLE) ×8 IMPLANT
GOWN STRL REUS W/ TWL XL LVL3 (GOWN DISPOSABLE) ×4 IMPLANT
GOWN STRL REUS W/TWL LRG LVL3 (GOWN DISPOSABLE) ×32
GOWN STRL REUS W/TWL XL LVL3 (GOWN DISPOSABLE) ×12
GRAFT WOVEN D/V 30DX30L (Vascular Products) ×2 IMPLANT
HEMOSTAT POWDER SURGIFOAM 1G (HEMOSTASIS) ×14 IMPLANT
HEMOSTAT SURGICEL 2X14 (HEMOSTASIS) ×6 IMPLANT
INSERT FOGARTY SM (MISCELLANEOUS) ×6 IMPLANT
INSERT FOGARTY XLG (MISCELLANEOUS) ×2 IMPLANT
KIT BASIN OR (CUSTOM PROCEDURE TRAY) ×4 IMPLANT
KIT ROOM TURNOVER OR (KITS) ×4 IMPLANT
KIT SUCTION CATH 14FR (SUCTIONS) ×8 IMPLANT
KIT VASOVIEW HEMOPRO VH 3000 (KITS) ×4 IMPLANT
LEAD PACING MYOCARDI (MISCELLANEOUS) ×2 IMPLANT
LINE VENT (MISCELLANEOUS) ×2 IMPLANT
LOOP VESSEL SUPERMAXI WHITE (MISCELLANEOUS) ×2 IMPLANT
MARKER GRAFT CORONARY BYPASS (MISCELLANEOUS) ×10 IMPLANT
NDL SUT 1 .5 CRC FRENCH EYE (NEEDLE) IMPLANT
NEEDLE AORTIC AIR ASPIRATING (NEEDLE) ×2 IMPLANT
NEEDLE FRENCH EYE (NEEDLE) ×4
NS IRRIG 1000ML POUR BTL (IV SOLUTION) ×26 IMPLANT
PACK OPEN HEART (CUSTOM PROCEDURE TRAY) ×4 IMPLANT
PAD ARMBOARD 7.5X6 YLW CONV (MISCELLANEOUS) ×8 IMPLANT
PAD ELECT DEFIB RADIOL ZOLL (MISCELLANEOUS) ×4 IMPLANT
PENCIL BUTTON HOLSTER BLD 10FT (ELECTRODE) ×4 IMPLANT
POWDER SURGICEL 3.0 GRAM (HEMOSTASIS) ×2 IMPLANT
PUNCH AORTIC ROT 4.0MM RCL 40 (MISCELLANEOUS) ×2 IMPLANT
PUNCH AORTIC ROTATE 4.0MM (MISCELLANEOUS) IMPLANT
PUNCH AORTIC ROTATE 4.5MM 8IN (MISCELLANEOUS) IMPLANT
PUNCH AORTIC ROTATE 5MM 8IN (MISCELLANEOUS) IMPLANT
SEALANT SURG COSEAL 8ML (VASCULAR PRODUCTS) ×4 IMPLANT
SET CARDIOPLEGIA MPS 5001102 (MISCELLANEOUS) ×4 IMPLANT
SPONGE LAP 18X18 X RAY DECT (DISPOSABLE) ×2 IMPLANT
SPONGE LAP 4X18 X RAY DECT (DISPOSABLE) IMPLANT
STAPLER VISISTAT 35W (STAPLE) ×2 IMPLANT
STOPCOCK 4 WAY LG BORE MALE ST (IV SETS) IMPLANT
SUT BONE WAX W31G (SUTURE) ×4 IMPLANT
SUT ETHIBON 2 0 V 52N 30 (SUTURE) ×4 IMPLANT
SUT ETHIBOND 2 0 SH (SUTURE) ×16
SUT ETHIBOND 2 0 SH 36X2 (SUTURE) IMPLANT
SUT ETHIBOND NAB MH 2-0 36IN (SUTURE) ×4 IMPLANT
SUT MNCRL AB 3-0 PS2 18 (SUTURE) IMPLANT
SUT MNCRL AB 4-0 PS2 18 (SUTURE) ×2 IMPLANT
SUT PROLENE 3 0 RB 1 (SUTURE) ×4 IMPLANT
SUT PROLENE 3 0 SH DA (SUTURE) ×6 IMPLANT
SUT PROLENE 4 0 RB 1 (SUTURE) ×36
SUT PROLENE 4 0 SH DA (SUTURE) ×4 IMPLANT
SUT PROLENE 4-0 RB1 .5 CRCL 36 (SUTURE) IMPLANT
SUT PROLENE 5 0 C 1 36 (SUTURE) ×10 IMPLANT
SUT PROLENE 6 0 C 1 30 (SUTURE) ×20 IMPLANT
SUT PROLENE 6 0 CC (SUTURE) IMPLANT
SUT PROLENE 7 0 BV 1 (SUTURE) IMPLANT
SUT PROLENE 7 0 BV1 MDA (SUTURE) ×6 IMPLANT
SUT PROLENE 8 0 BV175 6 (SUTURE) ×4 IMPLANT
SUT SILK  1 MH (SUTURE) ×6
SUT SILK 1 MH (SUTURE) IMPLANT
SUT SILK 1 TIES 10X30 (SUTURE) ×2 IMPLANT
SUT SILK 2 0 SH CR/8 (SUTURE) ×6 IMPLANT
SUT SILK 2 0 TIES 10X30 (SUTURE) ×2 IMPLANT
SUT SILK 2 0 TIES 17X18 (SUTURE) ×4
SUT SILK 2-0 18XBRD TIE BLK (SUTURE) IMPLANT
SUT SILK 3 0 SH CR/8 (SUTURE) ×2 IMPLANT
SUT SILK 4 0 TIE 10X30 (SUTURE) ×4 IMPLANT
SUT STEEL 6MS V (SUTURE) ×4 IMPLANT
SUT STEEL STERNAL CCS#1 18IN (SUTURE) IMPLANT
SUT STEEL SZ 6 DBL 3X14 BALL (SUTURE) ×4 IMPLANT
SUT TEM PAC WIRE 2 0 SH (SUTURE) ×8 IMPLANT
SUT VIC AB 1 CT1 18XCR BRD 8 (SUTURE) IMPLANT
SUT VIC AB 1 CT1 8-18 (SUTURE)
SUT VIC AB 1 CTX 27 (SUTURE) ×8 IMPLANT
SUT VIC AB 1 CTX 36 (SUTURE) ×12
SUT VIC AB 1 CTX36XBRD ANBCTR (SUTURE) ×4 IMPLANT
SUT VIC AB 2-0 CT1 27 (SUTURE) ×4
SUT VIC AB 2-0 CT1 TAPERPNT 27 (SUTURE) IMPLANT
SUT VIC AB 2-0 CTX 27 (SUTURE) ×4 IMPLANT
SUT VIC AB 2-0 CTX 36 (SUTURE) ×8 IMPLANT
SUT VIC AB 3-0 SH 27 (SUTURE)
SUT VIC AB 3-0 SH 27X BRD (SUTURE) IMPLANT
SUT VIC AB 3-0 X1 27 (SUTURE) ×12 IMPLANT
SUT VICRYL 4-0 PS2 18IN ABS (SUTURE) IMPLANT
SYSTEM SAHARA CHEST DRAIN ATS (WOUND CARE) ×4 IMPLANT
TAPE CLOTH SURG 4X10 WHT LF (GAUZE/BANDAGES/DRESSINGS) ×2 IMPLANT
TAPE PAPER 2X10 WHT MICROPORE (GAUZE/BANDAGES/DRESSINGS) ×2 IMPLANT
TOWEL GREEN STERILE FF (TOWEL DISPOSABLE) ×8 IMPLANT
TRAY CATH LUMEN 1 20CM STRL (SET/KITS/TRAYS/PACK) IMPLANT
TRAY FOLEY SILVER 16FR TEMP (SET/KITS/TRAYS/PACK) ×4 IMPLANT
TUBE FEEDING 8FR 16IN STR KANG (MISCELLANEOUS) ×4 IMPLANT
TUBING INSUFFLATION (TUBING) ×4 IMPLANT
UNDERPAD 30X30 (UNDERPADS AND DIAPERS) ×4 IMPLANT
VALVE MAGNA EASE AORTIC 23MM (Prosthesis & Implant Heart) ×2 IMPLANT
WATER STERILE IRR 1000ML POUR (IV SOLUTION) ×8 IMPLANT

## 2016-11-21 NOTE — Anesthesia Procedure Notes (Signed)
Central Venous Catheter Insertion Performed by: Catalina Gravel, anesthesiologist Start/End7/25/2018 7:00 AM, 11/21/2016 7:10 AM Patient location: Pre-op. Preanesthetic checklist: patient identified, IV checked, site marked, risks and benefits discussed, surgical consent, monitors and equipment checked, pre-op evaluation, timeout performed and anesthesia consent Hand hygiene performed  and maximum sterile barriers used  Total catheter length 90. PA cath was placed.[place]wan type:thermodilution PA Cath depth:48 Procedure performed without using ultrasound guided technique. Attempts: 1 Patient tolerated the procedure well with no immediate complications.

## 2016-11-21 NOTE — Anesthesia Procedure Notes (Signed)
Arterial Line Insertion Start/End7/25/2018 6:50 AM, 11/21/2016 7:00 AM Performed by: Catalina Gravel, [place] Tommy Rainwater, CRNA  Preanesthetic checklist: patient identified, IV checked, site marked, risks and benefits discussed, surgical consent, monitors and equipment checked, pre-op evaluation and timeout performed Lidocaine 1% used for infiltration Left, radial was placed Catheter size: 20 G Hand hygiene performed , maximum sterile barriers used  and Seldinger technique used Allen's test indicative of satisfactory collateral circulation Attempts: 2 Procedure performed without using ultrasound guided technique. Patient tolerated the procedure well with no immediate complications.

## 2016-11-21 NOTE — Progress Notes (Signed)
  Echocardiogram Echocardiogram Transesophageal has been performed.  Dakota Martin 11/21/2016, 9:43 AM

## 2016-11-21 NOTE — Progress Notes (Signed)
Called Dr. Roxan Hockey to update on pt condition; per Dr. Roxan Hockey okay to wean tonight. RT notified. Will continue to closely monitor.  Sherlie Ban, RN

## 2016-11-21 NOTE — Interval H&P Note (Signed)
History and Physical Interval Note:  Cath results noted. Will need CABG to LAD and PDA if large enough to graft 11/21/2016 7:29 AM  Dakota Martin  has presented today for surgery, with the diagnosis of ASCENDING ANEURYSM  The various methods of treatment have been discussed with the patient and family. After consideration of risks, benefits and other options for treatment, the patient has consented to  Procedure(s): REDO STERNOTOMY (N/A) REDO AORTIC VALVE REPLACEMENT (AVR) (N/A) THORACIC ASCENDING ANEURYSM REPAIR (AAA) (N/A) TRANSESOPHAGEAL ECHOCARDIOGRAM (TEE) (N/A) CORONARY ARTERY BYPASS GRAFTING (CABG) (N/A) as a surgical intervention .  The patient's history has been reviewed, patient examined, no change in status, stable for surgery.  I have reviewed the patient's chart and labs.  Questions were answered to the patient's satisfaction.     Melrose Nakayama

## 2016-11-21 NOTE — Transfer of Care (Signed)
Immediate Anesthesia Transfer of Care Note  Patient: Dakota Martin  Procedure(s) Performed: Procedure(s): REDO STERNOTOMY (N/A) REDO AORTIC VALVE REPLACEMENT (AVR) (N/A) THORACIC ASCENDING ANEURYSM REPAIR (AAA) (N/A) TRANSESOPHAGEAL ECHOCARDIOGRAM (TEE) (N/A) CORONARY ARTERY BYPASS GRAFTING (CABG) x 2, using right leg greater saphenous vein harvested endoscopically (N/A)  Patient Location: SICU  Anesthesia Type:General  Level of Consciousness: Patient remains intubated per anesthesia plan  Airway & Oxygen Therapy: Patient remains intubated per anesthesia plan and Patient placed on Ventilator (see vital sign flow sheet for setting)  Post-op Assessment: Report given to RN and Post -op Vital signs reviewed and stable  Post vital signs: Reviewed and stable  Last Vitals:  Vitals:   11/21/16 0609  BP: (!) 165/62  Pulse: 68  Resp: 20  Temp: 37.1 C    Last Pain:  Vitals:   11/21/16 0609  TempSrc: Oral      Patients Stated Pain Goal: 3 (69/62/95 2841)  Complications: No apparent anesthesia complications

## 2016-11-21 NOTE — Care Management Note (Addendum)
Case Management Note  Patient Details  Name: Dakota Martin MRN: [id number] Date of Birth: 12-23-1940  Subjective/Objective:   From home with wife, surgery today for AVR, AAA Repair, CABG.                  Action/Plan: NCM will follow for dc needs.   Expected Discharge Date:                  Expected Discharge Plan:     In-House Referral:     Discharge planning Services  CM Consult  Post Acute Care Choice:    Choice offered to:     DME Arranged:    DME Agency:     HH Arranged:    HH Agency:     Status of Service:  In process, will continue to follow  If discussed at Long Length of Stay Meetings, dates discussed:    Additional Comments:  Zenon Mayo, RN 11/21/2016, 4:30 PM

## 2016-11-21 NOTE — H&P (View-Only) (Signed)
    [address]       [place] [address]             [phone]    HPI: [name] returns to further discuss possible repair of his ascending aneurysm.  [name] is a 75-year-old man with a past medical history significant for hypertension, hyperlipidemia, arthritis, gout, and aortic valve replacement for aortic stenosis. He had an aortic valve replacement with a Bjork Shiley valve in 1982. In 2015 he had a cardiac echocardiogram which showed the valve to have a mean gradient of 15 mmHg a peak gradient 31 mmHg. His ascending aorta was noted to be enlarged so a CT was done. 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 I have been following him every 6 months since then. I last saw him in May. At that time he was doing well but the aneurysm had grown from 5.1 to almost 5.5 cm in the interim. We had a long discussion about possibly going ahead with aortic aneurysm repair. We also discussed replacing his Bjork Shiley valve at the time of aneurysm repair.  He had an echocardiogram. He continues to feel well with no chest pain, pressure, tightness, or shortness of breath. He remains physically active and plays tennis frequently. His echocardiogram showed the valve was functioning well but his transvalvular gradients had increased from his previous echo in 2015. Mean gradient 15 up to 17 mmHg, peak gradient went from 31-41 mmHg. His LVEF was 55-60%.  When I saw him in early June he was not yet ready to make a decision as to whether to proceed with surgery. He now decided to proceed and is also decided he would like his Bjork Shiley valve replaced with a tissue valve at the time of surgery.  Past Medical History:  Diagnosis Date  . Arthritis   . Ascending aortic aneurysm (HCC) 02/14/2016   5 cm in 2015 // Chest CT 08/28/16:  Ascending thoracic aortic aneurysm 5.5 cm, Thoracic and abdominal aortic atherosclerosis, Remote granulomatous disease  . Colon polyp   . Gout   .  Hyperlipidemia   . Hypertension   . 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 Aortic valve  1982   prosthetic  val ve  . TONSILLECTOMY       Current Outpatient Prescriptions  Medication Sig Dispense Refill  . allopurinol (ZYLOPRIM) 300 MG tablet Take 300 mg by mouth daily.    . amLODipine (NORVASC) 5 MG tablet Take 5 mg by mouth daily.    . amoxicillin (AMOXIL) 500 MG capsule Take 2,000 mg by mouth as needed. For dental visits     . atorvastatin (LIPITOR) 10 MG tablet Take 10 mg by mouth daily.    . carvedilol (COREG) 6.25 MG tablet TAKE 1 TABLET BY MOUTH TWO  TIMES DAILY 180 tablet 3  . Cholecalciferol (VITAMIN D-3 PO) Take 1,000 Int'l Units by mouth daily.     . Coenzyme Q10 (CO Q 10 PO) Take 1 tablet by mouth daily.     . Cyanocobalamin (VITAMIN B-12) 5000 MCG SUBL Place under the tongue once a week.    .   ezetimibe (ZETIA) 10 MG tablet Take 1/2 TABLET BY MOUTH (5 MG TOTAL) ONCE DAILY    . fish oil-omega-3 fatty acids 1000 MG capsule Take 2 g by mouth daily.    . hydrochlorothiazide (MICROZIDE) 12.5 MG capsule TAKE 1 CAPSULE BY MOUTH  DAILY 90 capsule 3  . Magnesium 500 MG CAPS Take 500 mg by mouth daily.    . OVER THE COUNTER MEDICATION Take 1 capsule by mouth daily. AYER-TRIPHALA    . potassium chloride (K-DUR,KLOR-CON) 10 MEQ tablet TAKE 1 TABLET BY MOUTH  DAILY 90 tablet 3  . telmisartan (MICARDIS) 80 MG tablet Take 80 mg by mouth daily.  11  . warfarin (COUMADIN) 5 MG tablet Take 5 mg by mouth daily. Take 4 days a week and 7.5 mg 3days/wk OR AS DIRECTED     No current facility-administered medications for this  visit.    Family History  Problem Relation Age of Onset  . Hyperlipidemia Mother   . Colon cancer Mother   . Emphysema Father   . Other Father        respiratory failure  . Diabetes Maternal Grandfather   . Colon polyps Neg Hx   . Kidney disease Neg Hx   . Esophageal cancer Neg Hx   . Gallbladder disease Neg Hx    Social History   Social History  . Marital status: Married    Spouse name: N/A  . Number of children: 2  . Years of education: N/A   Occupational History  . Retired    Social History Main Topics  . Smoking status: Never Smoker  . Smokeless tobacco: Never Used  . Alcohol use 0.0 oz/week     Comment: 2 drinks a day  . Drug use: No  . Sexual activity: Not on file   Other Topics Concern  . 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 Valve click Abdomen soft nontender Extremities without clubbing cyanosis or edema Peripheral pulses intact  Diagnostic Tests: CT chest reviewed 5.4 cm ascending aortic aneurysm, returns to normal prior to the takeoff of the innominate artery.  Impression: 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 He now has a 5.4 cm ascending aortic aneurysm. Given the growth of 4 mm and a 6 month period time in the size of 5.4 cm and the native valve having been a bicuspid valve, I think surgical repair is indicated. I think he is at extremely high risk for further expansion with potential for dissection or rupture. We have discussed, on multiple occasions,  the possibility of replacing the Bjork Shiley prosthesis. I advised him to do so. Using a tissue valve with potentially allow him to come off of Coumadin which can be much more problematic to manage as people age.  We previously have discussed the indications, risks, benefits, and alternatives in detail. He is well aware   of the risks and has accepted them he does not wish to further discuss those in detail at this time.  He is scheduled to have cardiac catheterization on 11/19/2016. He will stop his Coumadin on 11/13/2016. He will have an INR done on 7/20 and then be given instructions regarding Lovenox dosing.  Plan: Cardiac catheterization on 11/19/2016  Redo sternotomy, redo AVR, repair ascending aortic aneurysm (Bentall procedure) on Wed 11/21/2016  Aarilyn Dye C Tommy Goostree, MD [facility] [phone]    

## 2016-11-21 NOTE — Anesthesia Procedure Notes (Signed)
Central Venous Catheter Insertion Performed by: Catalina Gravel, anesthesiologist Start/End7/25/2018 6:50 AM, 11/21/2016 7:00 AM Patient location: Pre-op. Preanesthetic checklist: patient identified, IV checked, site marked, risks and benefits discussed, surgical consent, monitors and equipment checked, pre-op evaluation, timeout performed and anesthesia consent Lidocaine 1% used for infiltration and patient sedated Hand hygiene performed  and maximum sterile barriers used  Catheter size: 9 Fr [name] introducer Procedure performed using ultrasound guided technique. Ultrasound Notes:anatomy identified, needle tip was noted to be adjacent to the nerve/plexus identified, no ultrasound evidence of intravascular and/or intraneural injection and image(s) printed for medical record Attempts: 1 Following insertion, line sutured and dressing applied. Post procedure assessment: blood return through all ports, free fluid flow and no air  Patient tolerated the procedure well with no immediate complications.

## 2016-11-21 NOTE — Brief Op Note (Addendum)
11/21/2016  1:23 PM  PATIENT:  Dakota Martin  76 y.o. male  PRE-OPERATIVE DIAGNOSIS:  ASCENDING ANEURYSM  POST-OPERATIVE DIAGNOSIS:  ASCENDING ANEURYSM  PROCEDURE:  Procedure(s): REDO STERNOTOMY (N/A) REDO AORTIC VALVE REPLACEMENT (AVR) (N/A) THORACIC ASCENDING ANEURYSM REPAIR (AAA) (N/A) TRANSESOPHAGEAL ECHOCARDIOGRAM (TEE) (N/A) CORONARY ARTERY BYPASS GRAFTING (CABG) x 2, using right leg greater saphenous vein harvested endoscopically (N/A)  SURGEON:  Surgeon(s) and Role:    Melrose Nakayama, MD - Primary  PHYSICIAN ASSISTANT:  Nicholes Rough, PA-C   ANESTHESIA:   general  EBL:  Total I/O In: 1000 [I.V.:1000] Out: 2625 [Urine:2625]  BLOOD ADMINISTERED:none  DRAINS: ROUTINE   LOCAL MEDICATIONS USED:  NONE  SPECIMEN:  Source of Specimen:  original aortic mechanical valve, portion of ascending aorta   DISPOSITION OF SPECIMEN:  PATHOLOGY  COUNTS:  YES  PLAN OF CARE: Admit to inpatient   PATIENT DISPOSITION:  ICU - intubated and hemodynamically stable.   Delay start of Pharmacological VTE agent (>24hrs) due to surgical blood loss or risk of bleeding: yes  XC= 190 min CPB= 250 min Coagulopathic bleeding post bypass

## 2016-11-21 NOTE — Progress Notes (Signed)
Dr. Roxan Hockey called this RN for update on pt condition. New orders received, per Dr. Roxan Hockey, plan is to NOT return to OR tonight. Will continue to closely monitor pt.  Sherlie Ban, RN

## 2016-11-21 NOTE — Anesthesia Procedure Notes (Signed)
Procedure Name: Intubation Date/Time: 11/21/2016 7:57 AM Performed by: Teressa Lower Pre-anesthesia Checklist: Patient identified, Emergency Drugs available, Suction available and Patient being monitored Patient Re-evaluated:Patient Re-evaluated prior to induction Oxygen Delivery Method: Circle system utilized Preoxygenation: Pre-oxygenation with 100% oxygen Induction Type: IV induction Ventilation: Mask ventilation without difficulty Laryngoscope Size: [name] and 4 Grade View: Grade II Tube type: Oral Tube size: 8.0 mm Number of attempts: 1 Airway Equipment and Method: Stylet and Oral airway Placement Confirmation: ETT inserted through vocal cords under direct vision,  positive ETCO2 and breath sounds checked- equal and bilateral Secured at: 23 cm Tube secured with: Tape Dental Injury: Teeth and Oropharynx as per pre-operative assessment

## 2016-11-22 ENCOUNTER — Inpatient Hospital Stay (HOSPITAL_COMMUNITY): Payer: Medicare Other

## 2016-11-22 ENCOUNTER — Encounter (HOSPITAL_COMMUNITY): Payer: Self-pay | Admitting: Thoracic Surgery (Cardiothoracic Vascular Surgery)

## 2016-11-22 LAB — POCT I-STAT 3, ART BLOOD GAS (G3+)
ACID-BASE DEFICIT: 1 mmol/L (ref 0.0–2.0)
Acid-base deficit: 2 mmol/L (ref 0.0–2.0)
Bicarbonate: 22 mmol/L (ref 20.0–28.0)
Bicarbonate: 22.8 mmol/L (ref 20.0–28.0)
O2 SAT: 94 %
O2 SAT: 94 %
PCO2 ART: 34.3 mmHg (ref 32.0–48.0)
PCO2 ART: 35.2 mmHg (ref 32.0–48.0)
PH ART: 7.422 (ref 7.350–7.450)
Patient temperature: 37.6
Patient temperature: 37.5
TCO2: 23 mmol/L (ref 0–100)
TCO2: 24 mmol/L (ref 0–100)
pH, Arterial: 7.418 (ref 7.350–7.450)
pO2, Arterial: 71 mmHg — ABNORMAL LOW (ref 83.0–108.0)
pO2, Arterial: 69 mmHg — ABNORMAL LOW (ref 83.0–108.0)

## 2016-11-22 LAB — BPAM PLATELET PHERESIS
BLOOD PRODUCT EXPIRATION DATE: 201807262359
Blood Product Expiration Date: 201807252359
Blood Product Expiration Date: 201807262359
ISSUE DATE / TIME: 201807251313
ISSUE DATE / TIME: 201807251603
ISSUE DATE / TIME: 201807251927
UNIT TYPE AND RH: 6200
UNIT TYPE AND RH: 6200
Unit Type and Rh: 8400

## 2016-11-22 LAB — PREPARE FRESH FROZEN PLASMA
UNIT DIVISION: 0
UNIT DIVISION: 0
UNIT DIVISION: 0
UNIT DIVISION: 0

## 2016-11-22 LAB — BASIC METABOLIC PANEL
Anion gap: 8 (ref 5–15)
BUN: 11 mg/dL (ref 6–20)
CHLORIDE: 109 mmol/L (ref 101–111)
CO2: 23 mmol/L (ref 22–32)
Calcium: 7.3 mg/dL — ABNORMAL LOW (ref 8.9–10.3)
Creatinine, Ser: 0.97 mg/dL (ref 0.61–1.24)
GFR calc Af Amer: >60 mL/min (ref >60)
GLUCOSE: 104 mg/dL — AB (ref 65–99)
POTASSIUM: 3.7 mmol/L (ref 3.5–5.1)
Sodium: 140 mmol/L (ref 135–145)

## 2016-11-22 LAB — PREPARE PLATELET PHERESIS
UNIT DIVISION: 0
Unit division: 0
Unit division: 0

## 2016-11-22 LAB — GLUCOSE, CAPILLARY
GLUCOSE-CAPILLARY: 113 mg/dL — AB (ref 65–99)
GLUCOSE-CAPILLARY: 99 mg/dL (ref 65–99)
GLUCOSE-CAPILLARY: 144 mg/dL — AB (ref 65–99)
Glucose-Capillary: 118 mg/dL — ABNORMAL HIGH (ref 65–99)
Glucose-Capillary: 127 mg/dL — ABNORMAL HIGH (ref 65–99)
Glucose-Capillary: 112 mg/dL — ABNORMAL HIGH (ref 65–99)
Glucose-Capillary: 105 mg/dL — ABNORMAL HIGH (ref 65–99)
Glucose-Capillary: 116 mg/dL — ABNORMAL HIGH (ref 65–99)
Glucose-Capillary: 127 mg/dL — ABNORMAL HIGH (ref 65–99)
Glucose-Capillary: 115 mg/dL — ABNORMAL HIGH (ref 65–99)
Glucose-Capillary: 129 mg/dL — ABNORMAL HIGH (ref 65–99)
Glucose-Capillary: 129 mg/dL — ABNORMAL HIGH (ref 65–99)
Glucose-Capillary: 125 mg/dL — ABNORMAL HIGH (ref 65–99)
Glucose-Capillary: 112 mg/dL — ABNORMAL HIGH (ref 65–99)
Glucose-Capillary: 150 mg/dL — ABNORMAL HIGH (ref 65–99)

## 2016-11-22 LAB — CBC
HCT: 28.8 % — ABNORMAL LOW (ref 39.0–52.0)
HEMATOCRIT: 25 % — AB (ref 39.0–52.0)
Hemoglobin: 9.9 g/dL — ABNORMAL LOW (ref 13.0–17.0)
Hemoglobin: 8.6 g/dL — ABNORMAL LOW (ref 13.0–17.0)
MCH: 30 pg (ref 26.0–34.0)
MCH: 29.5 pg (ref 26.0–34.0)
MCHC: 34.4 g/dL (ref 30.0–36.0)
MCHC: 34.4 g/dL (ref 30.0–36.0)
MCV: 87.3 fL (ref 78.0–100.0)
MCV: 85.6 fL (ref 78.0–100.0)
PLATELETS: 100 10*3/uL — AB (ref 150–400)
Platelets: 71 10*3/uL — ABNORMAL LOW (ref 150–400)
RBC: 3.3 MIL/uL — AB (ref 4.22–5.81)
RBC: 2.92 MIL/uL — ABNORMAL LOW (ref 4.22–5.81)
RDW: 14.8 % (ref 11.5–15.5)
RDW: 15.1 % (ref 11.5–15.5)
WBC: 7.6 10*3/uL (ref 4.0–10.5)
WBC: 8.6 10*3/uL (ref 4.0–10.5)

## 2016-11-22 LAB — CREATININE, SERUM
CREATININE: 0.91 mg/dL (ref 0.61–1.24)
Creatinine, Ser: 1.16 mg/dL (ref 0.61–1.24)
GFR calc non Af Amer: 60 mL/min — ABNORMAL LOW (ref >60)

## 2016-11-22 LAB — BPAM FFP
BLOOD PRODUCT EXPIRATION DATE: 201807282359
BLOOD PRODUCT EXPIRATION DATE: 201807282359
Blood Product Expiration Date: 201807282359
Blood Product Expiration Date: 201807282359
ISSUE DATE / TIME: 201807251603
ISSUE DATE / TIME: 201807251603
ISSUE DATE / TIME: 201807251653
ISSUE DATE / TIME: 201807251653
UNIT TYPE AND RH: 9500
UNIT TYPE AND RH: 9500
UNIT TYPE AND RH: 5100
Unit Type and Rh: 5100

## 2016-11-22 LAB — MAGNESIUM
MAGNESIUM: 2.8 mg/dL — AB (ref 1.7–2.4)
Magnesium: 2.3 mg/dL (ref 1.7–2.4)
Magnesium: 2.2 mg/dL (ref 1.7–2.4)

## 2016-11-22 LAB — POCT I-STAT, CHEM 8
BUN: 13 mg/dL (ref 6–20)
CALCIUM ION: 1.07 mmol/L — AB (ref 1.15–1.40)
CHLORIDE: 100 mmol/L — AB (ref 101–111)
CREATININE: 1 mg/dL (ref 0.61–1.24)
GLUCOSE: 146 mg/dL — AB (ref 65–99)
HCT: 22 % — ABNORMAL LOW (ref 39.0–52.0)
Hemoglobin: 7.5 g/dL — ABNORMAL LOW (ref 13.0–17.0)
Potassium: 3.9 mmol/L (ref 3.5–5.1)
Sodium: 139 mmol/L (ref 135–145)
TCO2: 22 mmol/L (ref 0–100)

## 2016-11-22 MED ORDER — ORAL CARE MOUTH RINSE
15.0000 mL | Freq: Two times a day (BID) | OROMUCOSAL | Status: DC
  Administered 2016-11-22 – 2016-11-28 (×5): 15 mL via OROMUCOSAL

## 2016-11-22 MED ORDER — MUPIROCIN 2 % EX OINT
1.0000 "application " | TOPICAL_OINTMENT | Freq: Two times a day (BID) | CUTANEOUS | Status: DC
  Administered 2016-11-22 – 2016-11-26 (×9): 1 via NASAL
  Filled 2016-11-22 (×2): qty 22

## 2016-11-22 MED ORDER — FUROSEMIDE 10 MG/ML IJ SOLN
20.0000 mg | Freq: Two times a day (BID) | INTRAMUSCULAR | Status: DC
  Administered 2016-11-22 – 2016-11-23 (×3): 20 mg via INTRAVENOUS
  Filled 2016-11-22 (×3): qty 2

## 2016-11-22 MED ORDER — OXYBUTYNIN CHLORIDE 5 MG PO TABS
5.0000 mg | ORAL_TABLET | Freq: Three times a day (TID) | ORAL | Status: DC | PRN
Start: 2016-11-22 — End: 2016-11-30
  Administered 2016-11-22 – 2016-11-23 (×3): 5 mg via ORAL
  Filled 2016-11-22 (×4): qty 1

## 2016-11-22 MED ORDER — ALLOPURINOL 300 MG PO TABS
300.0000 mg | ORAL_TABLET | Freq: Every day | ORAL | Status: DC
  Administered 2016-11-22 – 2016-11-29 (×8): 300 mg via ORAL
  Filled 2016-11-22 (×8): qty 1

## 2016-11-22 MED ORDER — INSULIN DETEMIR 100 UNIT/ML ~~LOC~~ SOLN: 20.0000 [IU] | Freq: Two times a day (BID) | SUBCUTANEOUS | Status: DC

## 2016-11-22 MED ORDER — INSULIN DETEMIR 100 UNIT/ML ~~LOC~~ SOLN
20.0000 [IU] | Freq: Two times a day (BID) | SUBCUTANEOUS | Status: DC
  Administered 2016-11-22 (×2): 20 [IU] via SUBCUTANEOUS
  Filled 2016-11-22 (×3): qty 0.2

## 2016-11-22 MED ORDER — POTASSIUM CHLORIDE 10 MEQ/50ML IV SOLN
10.0000 meq | INTRAVENOUS | Status: AC
  Administered 2016-11-22 (×3): 10 meq via INTRAVENOUS

## 2016-11-22 MED ORDER — DOPAMINE-DEXTROSE 3.2-5 MG/ML-% IV SOLN: 3.0000 ug/kg/min | INTRAVENOUS | Status: DC

## 2016-11-22 MED ORDER — INSULIN ASPART 100 UNIT/ML ~~LOC~~ SOLN
0.0000 [IU] | SUBCUTANEOUS | Status: DC
  Administered 2016-11-22 – 2016-11-23 (×3): 2 [IU] via SUBCUTANEOUS

## 2016-11-22 MED FILL — Sodium Bicarbonate IV Soln 8.4%: INTRAVENOUS | Qty: 50 | Status: AC

## 2016-11-22 MED FILL — Electrolyte-R (PH 7.4) Solution: INTRAVENOUS | Qty: 6000 | Status: AC

## 2016-11-22 MED FILL — Sodium Chloride IV Soln 0.9%: INTRAVENOUS | Qty: 2000 | Status: AC

## 2016-11-22 MED FILL — Albumin, Human Inj 5%: INTRAVENOUS | Qty: 250 | Status: AC

## 2016-11-22 MED FILL — Mannitol IV Soln 20%: INTRAVENOUS | Qty: 500 | Status: AC

## 2016-11-22 MED FILL — Lidocaine HCl IV Inj 20 MG/ML: INTRAVENOUS | Qty: 5 | Status: AC

## 2016-11-22 NOTE — Progress Notes (Signed)
[place] chest tube drainage container full; changed to new drainage system using aseptic technique. Pt tolerated well. Will continue to monitor.  Sherlie Ban, RN

## 2016-11-22 NOTE — Progress Notes (Signed)
1 Day Post-Op Procedure(s) (LRB): REDO STERNOTOMY (N/A) REDO AORTIC VALVE REPLACEMENT (AVR) (N/A) THORACIC ASCENDING ANEURYSM REPAIR (AAA) (N/A) TRANSESOPHAGEAL ECHOCARDIOGRAM (TEE) (N/A) CORONARY ARTERY BYPASS GRAFTING (CABG) x 2, using right leg greater saphenous vein harvested endoscopically (N/A) Subjective: Minimal pain, denies nausea  Objective: Vital signs in last 24 hours: Temp:  [93.7 F (34.3 C)-99.7 F (37.6 C)] 98.8 F (37.1 C) (07/26 0730) Pulse Rate:  [89-102] 90 (07/26 0730) Cardiac Rhythm: A-V Sequential paced (07/26 0400) Resp:  [5-24] 19 (07/26 0730) BP: (99-148)/(48-75) 119/63 (07/26 0700) SpO2:  [92 %-100 %] 93 % (07/26 0730) Arterial Line BP: (69-150)/(32-64) 137/50 (07/26 0730) FiO2 (%):  [40 %-50 %] 40 % (07/26 0050) Weight:  [195 lb 15.8 oz (88.9 kg)] 195 lb 15.8 oz (88.9 kg) (07/26 0300)  Hemodynamic parameters for last 24 hours: PAP: (24-41)/(10-22) 28/12 CO:  [3 L/min-5.1 L/min] 4.3 L/min CI:  [1.6 L/min/m2-2.7 L/min/m2] 2.3 L/min/m2  Intake/Output from previous day: 07/25 0701 - 07/26 0700 In: 23953 [I.V.:5546; Blood:3011; NG/GT:50; IV Piggyback:2350] Out: 20233 [Urine:6240; Emesis/NG output:100; Blood:1900; Chest Tube:1980] Intake/Output this shift: No intake/output data recorded.  General appearance: alert, cooperative and no distress Neurologic: intact Heart: regular rate and rhythm Lungs: diminished breath sounds bibasilar Abdomen: normal findings: mildly distended, nontender  Lab Results:  Recent Labs  11/21/16 2300 11/21/16 2303 11/22/16 0406  WBC 6.9  --  7.6  HGB 10.1* 9.2* 9.9*  HCT 29.6* 27.0* 28.8*  PLT 95*  --  100*   BMET:  Recent Labs  11/21/16 2303 11/22/16 0406  NA 141 140  K 3.9 3.7  CL 103 109  CO2  --  23  GLUCOSE 139* 104*  BUN 12 11  CREATININE 0.80 0.97  CALCIUM  --  7.3*    PT/INR:  Recent Labs  11/21/16 1758  LABPROT 16.6*  INR 1.33   ABG    Component Value Date/Time   PHART 7.422  11/22/2016 0222   HCO3 22.8 11/22/2016 0222   TCO2 24 11/22/2016 0222   ACIDBASEDEF 1.0 11/22/2016 0222   O2SAT 94.0 11/22/2016 0222   CBG (last 3)   Recent Labs  11/22/16 0355 11/22/16 0458 11/22/16 0601  GLUCAP 112* 99 105*    Assessment/Plan: S/P Procedure(s) (LRB): REDO STERNOTOMY (N/A) REDO AORTIC VALVE REPLACEMENT (AVR) (N/A) THORACIC ASCENDING ANEURYSM REPAIR (AAA) (N/A) TRANSESOPHAGEAL ECHOCARDIOGRAM (TEE) (N/A) CORONARY ARTERY BYPASS GRAFTING (CABG) x 2, using right leg greater saphenous vein harvested endoscopically (N/A) -  CV- good hemodynamics- wean dopamine and neo  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

## 2016-11-22 NOTE — Op Note (Signed)
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  PROCEDURES:   Redo median sternotomy Redo aortic valve replacement with 23-mm [facility] Ease bovine pericardial valve (Serial number [id number], Model number [id number]) Biologic Bentall with 30-mm Hemashield graft Coronary artery bypass grafting x 2  Saphenous vein graft to left anterior descending  Saphenous vein graft to posterior descending Endoscopic vein harvest, right thigh.  SURGEON:  Dakota Martin, M.D.  ASSISTANT:  Dakota Rough, PA.  ANESTHESIA:  General.  FINDINGS:   1. 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 2. Intraoperative: Bjork-Shiley valve in place with pannus ingrowth above and below the valve.  Large atherosclerotic aneurysm of the aortic root and ascending aorta. Posterior descending poor target, LAD good target. Vein fair quality.  3. Postbypass TKZ:SWFU function of the bioprosthetic valve.Preserved LV function.    CLINICAL NOTE:  Mr. Garden is a 76 year old man who had a Bjork-Shiley aortic valve replacement approximately 35 years ago.  He has been followed recently for an ascending aneurysm, which had shown significant growth of approximately 4 mm, it was at the lower end of the scale for surgical replacement at 5.4 cm.  He was offered the option of proceeding with aortic aneurysm repair, which he wished  to do.  We also discussed the merits of replacing the Bjork-Shiley valve or leaving it in place; after a long discussion, he wished to have the mechanical valve replaced with a biologic valve.  During his preoperative evaluation, cardiac catheterization revealed moderate LAD disease and severe disease of the posterior descending.  The indications, risks, benefits and alternatives were discussed in detail with the patient.  He understood and accepted the risks and wished to proceed.  OPERATIVE NOTE:  [name] was brought to the preoperative holding area on November 21, 2016.  Anesthesia placed a Swan-Ganz catheter and an arterial blood pressure monitoring line.  He was taken to the operating room, anesthetized and intubated.  Intravenous antibiotics were administered.  A Foley catheter was placed.  The chest, abdomen and legs were prepped and draped in usual sterile fashion.  Transesophageal echocardiography was performed by Dr. Gifford Martin, see his separate note for full details.  There was a large root and ascending aneurysm.  There was significant atherosclerotic plaque in the descending thoracic aorta. There was preserved left ventricular function, good function of the mechanical prosthetic valve and mild mitral regurgitation.  The chest, abdomen and legs were prepped and draped in usual sterile fashion.  A redo median sternotomy was performed.  An oscillating saw was used for the sternum.  The chest was entered without difficulty.  A retractor was placed and dissection of the mediastinum was begun.  Simultaneously, incision was made in the medial aspect of the right leg at the level of the knee.  The greater saphenous vein was harvested endoscopically. 2000 units of heparin was administered during the vessel harvest.  The remainder of the full heparin dose was given after harvesting the vein. Initially with dissection of the mediastinum, there were dense adhesions, particularly anteriorly;  overall, the adhesions were mild-to-moderate. There was a large aortic aneurysm. Dissecting out the aneurysm distally, the innominate artery and left carotid were identified at their origins. It was determined that it would be possible to cannulate the proximal arch and placed a crossclamp on the distal ascending aorta without need for circulatory arrest for the distal anastomosis.  A left ventricular vent was placed via pursestring suture in the right superior pulmonary vein.  A retrograde cardioplegia cannula was placed into the coronary sinus via pursestring suture in the right atrium.  An antegrade cardioplegia cannula was placed in the ascending aorta.  A temperature probe was placed in myocardial septum.  The patient was cooled to 28 degrees Celsius.  The aorta was crossclamped.  The left ventricle was emptied via the aortic root vent.  Cardiac arrest then was achieved with combination of cold antegrade and retrograde blood cardioplegia. An initial 750 mL of cardioplegia was administered antegrade.  There was a rapid diastolic arrest and an additional 750 mL of cardioplegia was administered retrograde.  There was septal cooling to 9 degrees Celsius.  Additional cardioplegia was administered down each vein graft at its completion. Additional retrograde cardioplegia was administered at 20-minute intervals during the valve replacement and aneurysm repair portion of the procedure.  A reversed saphenous vein graft was placed end-to-side to the posterior descending branch of the right coronary.  This vessel was poor quality, was diseased both proximally and distally to the anastomosis.  The vessel did accept a 1.5-mm probe.  The vein was of fair quality.  The end-to-side anastomosis was performed with a running 7-0 Prolene suture. A probe passed easily distally at completion of the anastomosis. Cardioplegia was administered down the graft, there was good flow and hemostasis.  Next, a  reversed saphenous vein graft was placed end-to-side to the LAD. The LAD had moderate disease proximally with a 50% narrowing.  This did not have enough stenosis to warrant use of the left mammary due to fear of the mammary becoming atretic with competitive flow.  The vein was of fair quality (better quality portion than the part used for the posterior descending. It was anastomosed end-to-side with a running 7-0 Prolene suture.  Again, the probe passed proximally and distally at completion of anastomosis. There was good low and good hemostasis with cardioplegia administration.  The aorta was transected.  The Bjork-Shiley valve was inspected. There was some mild fibrinous material on the struts, and pannus ingrowth around the annulus both on the aortic side and the ventricular side.  The aortic root was prepared by creating buttons for the left and right coronary ostia.  The Bjork-Shiley valve was excised.  There was moderate calcification in the annulus, which was debrided.  The annulus sized for 23-mm Magna Ease bovine pericardial valve and a 30-mm graft.  The Magna Ease valve was prepared per manufacturer's recommendations.  2-0 Ethibond horizontal mattress sutures with subannular pledgets were placed circumferentially around the annulus. Fourteen sutures were utilized in all, these were then passed through the sewing ring of the valve.  The valve was lowered into place and sutures were sequentially tied.  The valve was well seated on the annulus. Probing with a fine tip right-angle revealed no gaps. The proximal anastomosis was performed by sewing the 30- mm Hemashield graft to the sewing ring of the Magna Ease valve.  This was done with a running 4-0 Prolene suture.  BioGlue was applied around the suture line.  The left coronary button then was prepared and a hole was made in the graft using thermal cautery.  The left main button was anastomosed to the graft with running 6-0 Prolene  suture.  The distal end of the graft was cut on a slight bevel.  There was a good size match to the distal ascending aorta and an end-to-end anastomosis was performed with a running 4-0 Prolene suture.  At the completion of this anastomosis, the patient was placed in Trendelenburg position.  The crossclamp was briefly removed to distend the graft to allow the site for the right coronary button to be determined. The clamp was only released for a few seconds before being reclamped.  A hole was created in the graft using thermal cautery and the right coronary button anastomosis was performed with a running 6-0 Prolene suture.  The patient was being rewarmed during this time.  The vein grafts were cut to length.  The proximal vein graft anastomoses were performed to 4.0-mm punch graft aortotomies  with running 6-0 Prolene sutures.  At the completion of the final proximal anastomosis, the patient was placed in Trendelenburg position.  A warm dose of retrograde cardioplegia was administered.  Lidocaine was administered.  Extensive de-airing maneuvers were performed and the aortic crossclamp was removed.  The total crossclamp time was 190 minutes.  The patient did not require defibrillation and remained in heart block with a ventricular escape during the post-crossclamp period.  A Magoon needle was placed into the graft to evacuate any residual air.  It should be noted that carbon dioxide was insufflated into the operative field beginning at the initiation of the bypass and continued until after bypass was completed.  A dopamine infusion was initiated at 3 mcg/kg/minute.  All anastomoses were inspected for hemostasis. Epicardial pacing wires were placed on the right ventricle and right atrium.  When the core temperature reached 37 degrees Celsius, the patient was weaned from cardiopulmonary bypass. An initial wean was performed, which showed good function of the prosthetic valve with  no perivalvular leaks.  There was 2+ MR rather than 1+, and there was initially septal dyskinesis.  The retrograde cannula and left ventricular vent were then removed.  The patient then weaned from cardiopulmonary bypass on the first attempt.  He was DDD paced at 90 beats per minute and on 3 mcg/kg/minute of dopamine at the time of separation from bypass.  The initial cardiac index was greater than 2 L/min/m2. The patient remained hemodynamically stable throughout the postbypass period.  Postbypass transesophageal echocardiography revealed improvement in left ventricular function and the mitral regurgitation returned to the baseline, 1+.  A test dose of protamine was administered and was well tolerated.  The atrial and aortic cannulae were removed. The remainder of the protamine was administered with only transient hypotension.  The chest was irrigated with warm saline and an attempt was made to achieve hemostasis.  There was coagulopathic bleeding.  The patient had thrombocytopenia.  Platelets were administered as well as Fresh frozen plasma.  No attempt was made to close the pericardium.  A 36-French chest tube and 32-French Blake drain were placed through separate subcostal incisions and  placed into the mediastinum.  The sternum was closed with a combination of single and double heavy-gauge stainless steel wires.  The pectoralis fascia, subcutaneous tissue and skin were closed in standard fashion.  All sponge, needle and instrument counts were correct at the end of the procedure.  The patient was taken from the operating room to the Surgical Intensive Care Unit in critical, but stable condition.     Dakota Standard Roxan Martin, M.D.     SCH/MEDQ  D:  11/21/2016  T:  11/22/2016  Job:  [id number]

## 2016-11-22 NOTE — Progress Notes (Signed)
Chaplain visited patient while rounding.  Patient's wife, Jacqlyn Larsen, at bedside.  Patient is very pleasant.  Chaplain introduced herself to them both and made them aware of Spiritual Care Services.  Chaplain available should this family have additional request of needs.  Chaplain provided ministry of present, support and encouragement.    11/22/16 1148  Clinical Encounter Type  Visited With Patient and family together  Visit Type Initial;Spiritual support

## 2016-11-22 NOTE — Procedures (Signed)
Extubation Procedure Note  Pt completed Rapid Wean without complication. Follows all commands. ABG within parameter. NIF -46, VC 1.7L, Cuff leak + Pt extubated to 6L (Sats 93%) Pt A&O.   Patient Details:   Name: Dakota Martin DOB: 1940/06/06 MRN: [id number]   Airway Documentation:     Evaluation  O2 sats: stable throughout Complications: No apparent complications Patient did tolerate procedure well. Bilateral Breath Sounds: Clear   Yes  Sharen Hint 11/22/2016, 1:33 AM

## 2016-11-22 NOTE — Progress Notes (Signed)
RT NOTE:  Cardiac Rapid Wean initiated. Pt follows all commands.

## 2016-11-22 NOTE — Progress Notes (Signed)
Rapid wean protocol initiated in collaboration with RT. Will continue to closely monitor.  Sherlie Ban, RN

## 2016-11-22 NOTE — Progress Notes (Addendum)
[facility] BRIEF SICU PROGRESS NOTE  1 Day Post-Op  S/P Procedure(s) (LRB): REDO STERNOTOMY (N/A) REDO AORTIC VALVE REPLACEMENT (AVR) (N/A) THORACIC ASCENDING ANEURYSM REPAIR (AAA) (N/A) TRANSESOPHAGEAL ECHOCARDIOGRAM (TEE) (N/A) CORONARY ARTERY BYPASS GRAFTING (CABG) x 2, using right leg greater saphenous vein harvested endoscopically (N/A)   Stable day Paced rhythm w/ stable BP off drips Breathing comfortably w/ O2 sats 94% on 3 L/min Diuresed this morning after lasix Labs okay  Plan: Continue current plan  Rexene Alberts, MD 11/22/2016 5:45 PM

## 2016-11-23 ENCOUNTER — Inpatient Hospital Stay (HOSPITAL_COMMUNITY): Payer: Medicare Other

## 2016-11-23 DIAGNOSIS — I442 Atrioventricular block, complete: Secondary | ICD-10-CM

## 2016-11-23 LAB — CBC
HEMATOCRIT: 23.6 % — AB (ref 39.0–52.0)
HEMOGLOBIN: 8.2 g/dL — AB (ref 13.0–17.0)
MCH: 30.4 pg (ref 26.0–34.0)
MCHC: 34.7 g/dL (ref 30.0–36.0)
MCV: 87.4 fL (ref 78.0–100.0)
Platelets: 63 10*3/uL — ABNORMAL LOW (ref 150–400)
RBC: 2.7 MIL/uL — AB (ref 4.22–5.81)
RDW: 15.5 % (ref 11.5–15.5)
WBC: 9.6 10*3/uL (ref 4.0–10.5)

## 2016-11-23 LAB — BASIC METABOLIC PANEL
ANION GAP: 7 (ref 5–15)
BUN: 15 mg/dL (ref 6–20)
CO2: 25 mmol/L (ref 22–32)
Calcium: 7.5 mg/dL — ABNORMAL LOW (ref 8.9–10.3)
Chloride: 103 mmol/L (ref 101–111)
Creatinine, Ser: 1.14 mg/dL (ref 0.61–1.24)
GFR calc Af Amer: >60 mL/min (ref >60)
GLUCOSE: 132 mg/dL — AB (ref 65–99)
POTASSIUM: 3.7 mmol/L (ref 3.5–5.1)
SODIUM: 135 mmol/L (ref 135–145)

## 2016-11-23 LAB — GLUCOSE, CAPILLARY
GLUCOSE-CAPILLARY: 125 mg/dL — AB (ref 65–99)
GLUCOSE-CAPILLARY: 135 mg/dL — AB (ref 65–99)
Glucose-Capillary: 118 mg/dL — ABNORMAL HIGH (ref 65–99)
Glucose-Capillary: 118 mg/dL — ABNORMAL HIGH (ref 65–99)
Glucose-Capillary: 119 mg/dL — ABNORMAL HIGH (ref 65–99)

## 2016-11-23 MED ORDER — POTASSIUM CHLORIDE CRYS ER 20 MEQ PO TBCR
20.0000 meq | EXTENDED_RELEASE_TABLET | ORAL | Status: DC | PRN
  Administered 2016-11-23: 20 meq via ORAL
  Filled 2016-11-23: qty 1

## 2016-11-23 MED ORDER — POTASSIUM CHLORIDE 10 MEQ/50ML IV SOLN
10.0000 meq | INTRAVENOUS | Status: AC
  Administered 2016-11-23 (×2): 10 meq via INTRAVENOUS
  Filled 2016-11-23: qty 50

## 2016-11-23 MED ORDER — ASPIRIN EC 81 MG PO TBEC
81.0000 mg | DELAYED_RELEASE_TABLET | Freq: Every day | ORAL | Status: DC
  Administered 2016-11-23 – 2016-11-30 (×7): 81 mg via ORAL
  Filled 2016-11-23 (×8): qty 1

## 2016-11-23 MED ORDER — ZOLPIDEM TARTRATE 5 MG PO TABS: 5.0000 mg | ORAL_TABLET | Freq: Every evening | ORAL | Status: DC | PRN

## 2016-11-23 MED ORDER — FUROSEMIDE 10 MG/ML IJ SOLN
40.0000 mg | Freq: Two times a day (BID) | INTRAMUSCULAR | Status: DC
  Administered 2016-11-23 – 2016-11-24 (×2): 40 mg via INTRAVENOUS
  Filled 2016-11-23 (×2): qty 4

## 2016-11-23 MED ORDER — INSULIN ASPART 100 UNIT/ML ~~LOC~~ SOLN
0.0000 [IU] | Freq: Three times a day (TID) | SUBCUTANEOUS | Status: DC
  Administered 2016-11-23: 2 [IU] via SUBCUTANEOUS

## 2016-11-23 MED ORDER — WARFARIN - PHYSICIAN DOSING INPATIENT
Freq: Every day | Status: DC
  Administered 2016-11-24 – 2016-11-29 (×3)

## 2016-11-23 MED ORDER — WARFARIN SODIUM 5 MG PO TABS
5.0000 mg | ORAL_TABLET | Freq: Every day | ORAL | Status: DC
  Administered 2016-11-24 – 2016-11-26 (×3): 5 mg via ORAL
  Filled 2016-11-23 (×3): qty 1

## 2016-11-23 NOTE — Progress Notes (Signed)
CT surgery p.m. Rounds  Doing well after redo AVR Postop heart block treated with temporary pacing wires Seen by EP for permanent pacemaker on Monday We'll start Coumadin gradually Patient feels well this evening no complaints

## 2016-11-23 NOTE — Consult Note (Signed)
ELECTROPHYSIOLOGY CONSULT NOTE    Patient ID: Dakota Martin MRN: [id number], DOB/AGE: 09/06/1940 76 y.o.  Admit date: 11/21/2016 Date of Consult: 11/23/2016  Primary Physician: Crist Infante, MD Primary Cardiologist: Nahser  Reason for Consultation: heart block post AVR  HPI:  Dakota Martin is a 76 y.o. male is referred by Dr Roxan Hockey for evaluation of heart block post AVR.  Past medical history is significant for hypertension, aortic valve disease (s/p mechanical AVR in 1982), AAA and hyperlipidemia.  He was seen by Dr Roxan Hockey in the outpatient setting and repair of AAA as well as replacement of mechanical AVR was recommended. He was admitted 11/21/16 and underwent AAA repair and AVR.  Since surgery, he has had persistent heart block and EP has been asked to evaluate for treatment options.  He was very active prior to surgery playing tennis frequently.  He has had a ventricular escape rhythm at around 35-40/min.  He currently has stable surgical pain, but denies other chest pain, shortness of breath, recent fevers or chills.  Echo this admission with normal LVEF. EKG prior to surgery with narrow QRS.   Past Medical History:  Diagnosis Date  . Aortic valve disease   . Arthritis   . Ascending aortic aneurysm ([place]) 02/14/2016   5 cm in 2015 // Chest CT 08/28/16:  Ascending thoracic aortic aneurysm 5.5 cm, Thoracic and abdominal aortic atherosclerosis, Remote granulomatous disease  . Colon polyp   . Gout   . Heart murmur    as a younger person,   . Hyperlipidemia   . Hypertension   . 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 S/P aortic valve replacement with metallic valve 53/97/6734   Due to congenital aortic stenosis // [name] mechanical valve done in 1982 // Echo 09/10/16: Mild focal basal septal hypertrophy, [name], [name], [name], mod aortic root  enlargement, aortic root 36 mm, ascending aorta 49 mm, [name], [name]     Surgical History:  Past Surgical History:  Procedure Laterality Date  . Aortic valve  1982   prosthetic  val ve  . AORTIC VALVE REPLACEMENT N/A 11/21/2016   Procedure: REDO AORTIC VALVE REPLACEMENT (AVR);  Surgeon: Melrose Nakayama, MD;  Location: [place];  Service: Open Heart Surgery;  Laterality: N/A;  . CARDIAC CATHETERIZATION    . CORONARY ARTERY BYPASS GRAFT N/A 11/21/2016   Procedure: CORONARY ARTERY BYPASS GRAFTING (CABG) x 2, using right leg greater saphenous vein harvested endoscopically;  Surgeon: Melrose Nakayama, MD;  Location: [place];  Service: Open Heart Surgery;  Laterality: N/A;  . cyst removed  1976   right side of neck  . TEE WITHOUT CARDIOVERSION N/A 11/21/2016   Procedure: TRANSESOPHAGEAL ECHOCARDIOGRAM (TEE);  Surgeon: Melrose Nakayama, MD;  Location: [place];  Service: Open Heart Surgery;  Laterality: N/A;  . THORACIC AORTIC ANEURYSM REPAIR N/A 11/21/2016   Procedure: THORACIC ASCENDING ANEURYSM REPAIR (AAA);  Surgeon: Melrose Nakayama, MD;  Location: [place];  Service: Open Heart Surgery;  Laterality: N/A;  . TONSILLECTOMY       Prescriptions Prior to Admission  Medication Sig Dispense Refill Last Dose  . allopurinol (ZYLOPRIM) 300 MG tablet Take 300 mg by mouth daily at 12 noon.    11/20/2016 at Unknown time  . amLODipine (NORVASC) 5 MG tablet Take 5 mg by mouth daily. In the morning   11/21/2016  at 0430  . atorvastatin (LIPITOR) 10 MG tablet Take 10 mg by mouth daily at 12 noon.    11/20/2016 at Unknown time  . carvedilol (COREG) 6.25 MG tablet TAKE 1 TABLET BY MOUTH TWO  TIMES DAILY (Patient taking differently: TAKE 1 TABLET (6.25 MG) BY MOUTH TWO  TIMES DAILY WITH BREAKFAST & WITH SUPPER) 180 tablet 3 11/21/2016 at 0430  . cholecalciferol (VITAMIN D) 1000 units tablet Take 1,000 Units by mouth daily with breakfast.   Past Week at Unknown time  . Coenzyme Q10 50 MG CAPS Take 50 mg by  mouth daily with breakfast.   Past Week at Unknown time  . Cyanocobalamin (VITAMIN B-12) 5000 MCG SUBL Place 5,000 mcg under the tongue 2 (two) times a week. Wednesday & Saturday.   Past Week at Unknown time  . ezetimibe (ZETIA) 10 MG tablet Take 5 mg by mouth daily. In the morning.   11/20/2016 at Unknown time  . fish oil-omega-3 fatty acids 1000 MG capsule Take 1 g by mouth 2 (two) times daily.    Past Week at Unknown time  . hydrochlorothiazide (MICROZIDE) 12.5 MG capsule TAKE 1 CAPSULE BY MOUTH  DAILY (Patient taking differently: TAKE 1 CAPSULE (12.5 MG) BY MOUTH  DAILY WITH BREAKFAST) 90 capsule 3 11/20/2016 at Unknown time  . ibuprofen (ADVIL,MOTRIN) 200 MG tablet Take 200-400 mg by mouth every 8 (eight) hours as needed (for pain.).   Past Week at Unknown time  . Magnesium 500 MG CAPS Take 500 mg by mouth every evening.    Past Week at Unknown time  . mupirocin nasal ointment (BACTROBAN) 2 % Place 1 application into the nose 2 (two) times daily. Use one-half of tube in each nostril twice daily for five (5) days. After application, press sides of nose together and gently massage.   11/21/2016 at 0430  . OVER THE COUNTER MEDICATION Take 1 capsule by mouth 2 (two) times daily. AYER-TRIPHALA    Past Week at Unknown time  . potassium chloride (K-DUR,KLOR-CON) 10 MEQ tablet TAKE 1 TABLET BY MOUTH  DAILY (Patient taking differently: TAKE 1 TABLET (10 MEQ) BY MOUTH  DAILY WITH BREAKFAST) 90 tablet 3 11/20/2016 at Unknown time  . telmisartan (MICARDIS) 80 MG tablet Take 80 mg by mouth daily. In the morning.  11 11/20/2016 at Unknown time  . amoxicillin (AMOXIL) 500 MG capsule Take 2,000 mg by mouth See admin instructions. Take 4 capsules (2000 mg) by mouth 1 hour prior to dental procedures.   Unknown at Unknown time  . enoxaparin (LOVENOX) 80 MG/0.8ML injection Inject 80 mg into the skin 2 (two) times daily.   11/18/2016 at 0800  . warfarin (COUMADIN) 5 MG tablet Take 5 mg by mouth daily. Take 1.5 tablets (7.5  mg) by mouth on Monday, Wednesday, & Friday; Take 1 tablet (5 mg) by mouth on all other days of the week Saturday, Sunday, Tuesday, & Thursday.   11/13/2016    Inpatient Medications: . acetaminophen  1,000 mg Oral Q6H   Or  . acetaminophen (TYLENOL) oral liquid 160 mg/5 mL  1,000 mg Per Tube Q6H  . allopurinol  300 mg Oral Q1200  . aspirin EC  81 mg Oral Daily  . atorvastatin  10 mg Oral q1800  . bisacodyl  10 mg Oral Daily   Or  . bisacodyl  10 mg Rectal Daily  . Chlorhexidine Gluconate Cloth  6 each Topical Daily  . docusate sodium  200 mg Oral Daily  . ezetimibe  5 mg Oral Daily  . furosemide  20 mg Intravenous BID  . insulin aspart  0-15 Units Subcutaneous TID WC  . insulin regular  0-10 Units Intravenous TID WC  . mouth rinse  15 mL Mouth Rinse BID  . mupirocin ointment  1 application Nasal BID  . pantoprazole  40 mg Oral Daily  . sodium chloride flush  10-40 mL Intracatheter Q12H  . sodium chloride flush  3 mL Intravenous Q12H    Allergies: No Known Allergies  Social History   Social History  . Marital status: Married    Spouse name: N/A  . Number of children: 2  . Years of education: N/A   Occupational History  . Retired    Social History Main Topics  . Smoking status: Never Smoker  . Smokeless tobacco: Never Used  . Alcohol use 0.0 oz/week     Comment: 2 drinks a day  . Drug use: No  . Sexual activity: Not on file   Other Topics Concern  . 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 Hyperlipidemia Mother   . Colon cancer Mother   . Emphysema Father   . Other Father        respiratory failure  . Diabetes Maternal Grandfather   . Colon polyps Neg Hx   . Kidney disease Neg Hx   . Esophageal cancer Neg Hx   . Gallbladder disease Neg Hx      Review of Systems: All other systems reviewed and are  otherwise negative except as noted above.  Physical Exam: Vitals:   11/23/16 0800 11/23/16 0900 11/23/16 1000 11/23/16 1144  BP: (!) 128/57 (!) 122/59 (!) 115/57 (!) 116/55  Pulse: 81 81 80 80  Resp: (!) 24 (!) 23 (!) 22 20  Temp:    98 F (36.7 C)  TempSrc:    Oral  SpO2: 97% 96% 96% 97%  Weight:      Height:        GEN- The patient is well appearing, alert and oriented x 3 today.   HEENT: normocephalic, atraumatic; sclera clear, conjunctiva pink; hearing intact; oropharynx clear; neck supple, no JVP Lymph- no cervical lymphadenopathy Lungs- Clear to ausculation bilaterally, normal work of breathing.  No wheezes, rales, rhonchi Heart- Regular rate and rhythm, no murmurs, rubs or gallops, PMI not laterally displaced GI- soft, non-tender, non-distended, bowel sounds present, no hepatosplenomegaly Extremities- no clubbing, cyanosis, or edema; DP/PT/radial pulses 2+ bilaterally MS- no significant deformity or atrophy Skin- warm and dry, no rash or lesion Psych- euthymic mood, full affect Neuro- strength and sensation are intact  Labs:  Lab Results  Component Value Date   WBC 9.6 11/23/2016   HGB 8.2 (L) 11/23/2016   HCT 23.6 (L) 11/23/2016   MCV 87.4 11/23/2016   PLT 63 (L) 11/23/2016    Recent Labs Lab 11/23/16 0338  NA 135  K 3.7  CL 103  CO2 25  BUN 15  CREATININE 1.14  CALCIUM 7.5*  GLUCOSE 132*      Radiology/Studies: Dg Chest 2 View  Result Date: 11/16/2016 CLINICAL DATA:  Preop for examination for abdominal aortic aneurysm repair and valve replacement. EXAM: CHEST  2 VIEW COMPARISON:  Chest CT - 08/28/2016 FINDINGS: Grossly unchanged  cardiac silhouette and mediastinal contours post median sternotomy and aortic valve replacement. Atherosclerotic plaque within the thoracic aorta. No focal airspace opacities. No pleural effusion or pneumothorax. No evidence of edema. No acute osseus abnormalities. Stigmata of DISH within the thoracic spine. IMPRESSION: 1.  No  acute cardiopulmonary disease. 2.  Aortic Atherosclerosis (ICD10-I70.0). Electronically Signed   By: Sandi Mariscal M.D.   On: 11/16/2016 12:59   Dg Chest Port 1 View  Result Date: 11/23/2016 CLINICAL DATA:  Prior CABG, aortic aneurysm repair, aortic valve replacement. EXAM: PORTABLE CHEST 1 VIEW COMPARISON:  11/22/2016. FINDINGS: Interim removal of Swan-Ganz catheter. Right IJ sheath in stable position. Mediastinal drainage catheters in stable position. Prior CABG and cardiac valve replacement. Aortic contour stable. Mild cardiomegaly. Bibasilar pulmonary infiltrates noted most likely secondary to mild pulmonary edema. Bibasilar atelectasis. Small left-sided pleural effusion. No pneumothorax . IMPRESSION: 1. Interim removal Swan-Ganz catheter. Right IJ sheath and mediastinal drainage catheter stable position. 2. Prior CABG and cardiac valve replacement. Aortic contour stable. Mild cardiomegaly with bibasilar pulmonary edema. Bibasilar atelectasis and small left pleural effusion noted. Electronically Signed   By: Marcello Moores  Register   On: 11/23/2016 07:40   Dg Chest Port 1 View  Result Date: 11/22/2016 CLINICAL DATA:  Status post CABG, aorto plasty, and aortic valve replacement. EXAM: PORTABLE CHEST 1 VIEW COMPARISON:  Portable chest x-ray of November 21, 2016 FINDINGS: There has been interval extubation of the trachea and of the esophagus. The lungs are reasonably well expanded. There is persistent left basilar density and small left pleural effusion. The cardiac silhouette is enlarged. The pulmonary vascularity is mildly prominent centrally. The Swan-Ganz catheter tip appears to be in a left lower lobe pulmonary artery and may be wedged. The mediastinal drain is in stable position. The prosthetic aortic valve appears unchanged in position. There is calcification in the wall of the aortic arch. IMPRESSION: Improved appearance of the chest following extubation. Persistent left lower lobe atelectasis with small left  pleural effusion. Decreased conspicuity of the pulmonary vascularity. Correlation as to the adequacy of the positioning of the Swan-Ganz catheter whose tip may be wedged and left lower lobe pulmonary artery branch. Electronically Signed   By: David  Martinique M.D.   On: 11/22/2016 07:25   Dg Chest Port 1 View  Result Date: 11/21/2016 CLINICAL DATA:  Status post aortic valve replacement and aortoplasty. EXAM: PORTABLE CHEST 1 VIEW COMPARISON:  PA and lateral chest x-ray of November 16, 2016 FINDINGS: The lungs are borderline hypoinflated. There is increased density at the left lung base with obscuration of the hemidiaphragm. There is no pneumothorax. There is mild biapical pleural thickening. The heart is mildly enlarged. The central pulmonary vascularity is prominent. A Swan-Ganz catheter is present with the tip in the main pulmonary outflow tract. There is a prosthetic aortic valve ring in place replacing the previously demonstrated valve ring. A mediastinal drain is present at the T6 level. The endotracheal tube tip lies approximately 4.3 cm above the carina. The esophagogastric tube tip in proximal port project below the inferior margin of the image. An external pacemaker defibrillator pad projects over the right upper hemithorax. IMPRESSION: Post median sternotomy and aortic valve replacement changes. Left lower lobe atelectasis with small left pleural effusion. Mild pulmonary vascular prominence and interstitial edema. The support devices are in reasonable position. Electronically Signed   By: David  Martinique M.D.   On: 11/21/2016 16:39    BMW:[id number] heart block, V rate 44, bifascicular block escape.  Narrow QRS pre-surgery (  personally reviewed)  TELEMETRY: V pacing (personally reviewed)   Assessment/Plan: 1.  Complete heart block s/p AVR Will continue to monitor over the weekend If persistent heart block on Monday, will need dual chamber PPM. With narrow QRS pre-surgery, may be reasonable to attempt  His Bundle pacing lead EP to follow  2.  S/p AAA repair and AVR Per TCTS  3. CAD - s/p CABG. Stable with no chest pain.  Mikle Bosworth.D.

## 2016-11-23 NOTE — Progress Notes (Signed)
2 Days Post-Op Procedure(s) (LRB): REDO STERNOTOMY (N/A) REDO AORTIC VALVE REPLACEMENT (AVR) (N/A) THORACIC ASCENDING ANEURYSM REPAIR (AAA) (N/A) TRANSESOPHAGEAL ECHOCARDIOGRAM (TEE) (N/A) CORONARY ARTERY BYPASS GRAFTING (CABG) x 2, using right leg greater saphenous vein harvested endoscopically (N/A) Subjective: Didn't sleep well last night, c/o irritation from Foley  Objective: Vital signs in last 24 hours: Temp:  [97 F (36.1 C)-99.1 F (37.3 C)] 98 F (36.7 C) (07/27 0722) Pulse Rate:  [79-89] 80 (07/27 0722) Cardiac Rhythm: A-V Sequential paced (07/27 0400) Resp:  [13-27] 26 (07/27 0722) BP: (103-128)/(44-60) 122/60 (07/27 0722) SpO2:  [89 %-96 %] 96 % (07/27 0722) Arterial Line BP: (111-138)/(43-71) 135/48 (07/26 1500) Weight:  [191 lb (86.6 kg)] 191 lb (86.6 kg) (07/27 0500)  Hemodynamic parameters for last 24 hours: CO:  [4.5 L/min] 4.5 L/min CI:  [2.4 L/min/m2] 2.4 L/min/m2  Intake/Output from previous day: 07/26 0701 - 07/27 0700 In: 1657.9 [P.O.:1080; I.V.:477.9; IV Piggyback:100] Out: 2220 [Urine:1800; Chest Tube:420] Intake/Output this shift: Total I/O In: 10 [I.V.:10] Out: -   General appearance: alert, cooperative and no distress Neurologic: intact Heart: regular rate and rhythm Lungs: diminished breath sounds bibasilar Abdomen: normal findings: soft, non-tender  Lab Results:  Recent Labs  11/22/16 1530 11/22/16 1540 11/23/16 0338  WBC 8.6  --  9.6  HGB 8.6* 7.5* 8.2*  HCT 25.0* 22.0* 23.6*  PLT 71*  --  63*   BMET:  Recent Labs  11/22/16 0406  11/22/16 1540 11/23/16 0338  NA 140  --  139 135  K 3.7  --  3.9 3.7  CL 109  --  100* 103  CO2 23  --   --  25  GLUCOSE 104*  --  146* 132*  BUN 11  --  13 15  CREATININE 0.97  < > 1.00 1.14  CALCIUM 7.3*  --   --  7.5*  < > = values in this interval not displayed.  PT/INR:  Recent Labs  11/21/16 1758  LABPROT 16.6*  INR 1.33   ABG    Component Value Date/Time   PHART 7.422  11/22/2016 0222   HCO3 22.8 11/22/2016 0222   TCO2 22 11/22/2016 1540   ACIDBASEDEF 1.0 11/22/2016 0222   O2SAT 94.0 11/22/2016 0222   CBG (last 3)   Recent Labs  11/23/16 0026 11/23/16 0410 11/23/16 0724  GLUCAP 118* 125* 135*    Assessment/Plan: S/P Procedure(s) (LRB): REDO STERNOTOMY (N/A) REDO AORTIC VALVE REPLACEMENT (AVR) (N/A) THORACIC ASCENDING ANEURYSM REPAIR (AAA) (N/A) TRANSESOPHAGEAL ECHOCARDIOGRAM (TEE) (N/A) CORONARY ARTERY BYPASS GRAFTING (CABG) x 2, using right leg greater saphenous vein harvested endoscopically (N/A) -CV- still in heart block with escape in 40s- no beta blockers  Will ask EP to see in case he needs a pacer  -RESP- bibasilar atelectasis- IS, diuresis  -RENAL- creatinine and lytes OK  Still volume overloaded- continue diuresis  Will dc Foley  -ENDO- CBG well controlled- change to AC/HS  -Thrombocytopenia- PLT down slightly- will check HIT panel  -Anemia secondary to ABL- stable, follow  -Continue ambulation   LOS: 2 days    Melrose Nakayama 11/23/2016

## 2016-11-24 ENCOUNTER — Inpatient Hospital Stay (HOSPITAL_COMMUNITY): Payer: Medicare Other

## 2016-11-24 LAB — PROTIME-INR
INR: 1.03
PROTHROMBIN TIME: 13.6 s (ref 11.4–15.2)

## 2016-11-24 LAB — BASIC METABOLIC PANEL
Anion gap: 9 (ref 5–15)
BUN: 17 mg/dL (ref 6–20)
CALCIUM: 7.7 mg/dL — AB (ref 8.9–10.3)
CO2: 25 mmol/L (ref 22–32)
Chloride: 101 mmol/L (ref 101–111)
Creatinine, Ser: 0.98 mg/dL (ref 0.61–1.24)
GFR calc Af Amer: >60 mL/min (ref >60)
Glucose, Bld: 120 mg/dL — ABNORMAL HIGH (ref 65–99)
POTASSIUM: 3.6 mmol/L (ref 3.5–5.1)
SODIUM: 135 mmol/L (ref 135–145)

## 2016-11-24 LAB — GLUCOSE, CAPILLARY
Glucose-Capillary: 105 mg/dL — ABNORMAL HIGH (ref 65–99)
Glucose-Capillary: 113 mg/dL — ABNORMAL HIGH (ref 65–99)
Glucose-Capillary: 118 mg/dL — ABNORMAL HIGH (ref 65–99)
Glucose-Capillary: 121 mg/dL — ABNORMAL HIGH (ref 65–99)
Glucose-Capillary: 99 mg/dL (ref 65–99)

## 2016-11-24 LAB — CBC
HCT: 23.9 % — ABNORMAL LOW (ref 39.0–52.0)
Hemoglobin: 8 g/dL — ABNORMAL LOW (ref 13.0–17.0)
MCH: 30.1 pg (ref 26.0–34.0)
MCHC: 33.5 g/dL (ref 30.0–36.0)
MCV: 89.8 fL (ref 78.0–100.0)
PLATELETS: 73 10*3/uL — AB (ref 150–400)
RBC: 2.66 MIL/uL — AB (ref 4.22–5.81)
RDW: 15.4 % (ref 11.5–15.5)
WBC: 11.7 10*3/uL — AB (ref 4.0–10.5)

## 2016-11-24 LAB — HEPARIN INDUCED PLATELET AB (HIT ANTIBODY): HEPARIN INDUCED PLT AB: 0.095 {OD_unit} (ref 0.000–0.400)

## 2016-11-24 MED ORDER — FUROSEMIDE 10 MG/ML IJ SOLN
40.0000 mg | Freq: Every day | INTRAMUSCULAR | Status: DC
  Administered 2016-11-25: 40 mg via INTRAVENOUS
  Filled 2016-11-24: qty 4

## 2016-11-24 MED ORDER — POTASSIUM CHLORIDE CRYS ER 20 MEQ PO TBCR
20.0000 meq | EXTENDED_RELEASE_TABLET | ORAL | Status: DC | PRN
  Administered 2016-11-24 (×2): 20 meq via ORAL
  Filled 2016-11-24 (×2): qty 1

## 2016-11-24 NOTE — Progress Notes (Signed)
CT surgery p.m. Rounds  Patient examined and record reviewed.Hemodynamics stable,labs satisfactory.Patient had stable day.Continue current care. Dakota Martin 11/24/2016   

## 2016-11-24 NOTE — Progress Notes (Signed)
3 Days Post-Op Procedure(s) (LRB): REDO STERNOTOMY (N/A) REDO AORTIC VALVE REPLACEMENT (AVR) (N/A) THORACIC ASCENDING ANEURYSM REPAIR (AAA) (N/A) TRANSESOPHAGEAL ECHOCARDIOGRAM (TEE) (N/A) CORONARY ARTERY BYPASS GRAFTING (CABG) x 2, using right leg greater saphenous vein harvested endoscopically (N/A) Subjective: Heart block- AV paced with good threshold < 3 Mv  Objective: Vital signs in last 24 hours: Temp:  [98.1 F (36.7 C)-98.7 F (37.1 C)] 98.1 F (36.7 C) (07/28 0400) Pulse Rate:  [79-85] 84 (07/28 1100) Cardiac Rhythm: A-V Sequential paced;Bundle branch block (07/28 0800) Resp:  [14-27] 25 (07/28 1100) BP: (105-130)/(51-64) 124/52 (07/28 1100) SpO2:  [89 %-99 %] 92 % (07/28 1100) FiO2 (%):  [1 %] 1 % (07/27 2000) Weight:  [193 lb 5.5 oz (87.7 kg)] 193 lb 5.5 oz (87.7 kg) (07/28 0500)  Hemodynamic parameters for last 24 hours:    Intake/Output from previous day: 07/27 0701 - 07/28 0700 In: 410 [P.O.:100; I.V.:210; IV Piggyback:100] Out: 1325 [Urine:1275; Chest Tube:50] Intake/Output this shift: Total I/O In: 556.7 [P.O.:480; I.V.:76.7] Out: 1400 [Urine:1400]       Exam    General- alert and comfortable   Lungs- clear without rales, wheezes   Cor- regular rate and rhythm, no murmur , gallop   Abdomen- soft, non-tender   Extremities - warm, non-tender, minimal edema   Neuro- oriented, appropriate, no focal weakness   Lab Results:  Recent Labs  11/23/16 0338 11/24/16 0615  WBC 9.6 11.7*  HGB 8.2* 8.0*  HCT 23.6* 23.9*  PLT 63* 73*   BMET:  Recent Labs  11/23/16 0338 11/24/16 0615  NA 135 135  K 3.7 3.6  CL 103 101  CO2 25 25  GLUCOSE 132* 120*  BUN 15 17  CREATININE 1.14 0.98  CALCIUM 7.5* 7.7*    PT/INR:  Recent Labs  11/24/16 0615  LABPROT 13.6  INR 1.03   ABG    Component Value Date/Time   PHART 7.422 11/22/2016 0222   HCO3 22.8 11/22/2016 0222   TCO2 22 11/22/2016 1540   ACIDBASEDEF 1.0 11/22/2016 0222   O2SAT 94.0 11/22/2016  0222   CBG (last 3)   Recent Labs  11/23/16 1951 11/24/16 0002 11/24/16 0853  GLUCAP 118* 121* 105*    Assessment/Plan: S/P Procedure(s) (LRB): REDO STERNOTOMY (N/A) REDO AORTIC VALVE REPLACEMENT (AVR) (N/A) THORACIC ASCENDING ANEURYSM REPAIR (AAA) (N/A) TRANSESOPHAGEAL ECHOCARDIOGRAM (TEE) (N/A) CORONARY ARTERY BYPASS GRAFTING (CABG) x 2, using right leg greater saphenous vein harvested endoscopically (N/A) PPM Monday Coumadin started   LOS: 3 days    Dakota Martin 11/24/2016

## 2016-11-25 ENCOUNTER — Inpatient Hospital Stay (HOSPITAL_COMMUNITY): Payer: Medicare Other

## 2016-11-25 DIAGNOSIS — Z952 Presence of prosthetic heart valve: Secondary | ICD-10-CM

## 2016-11-25 LAB — GLUCOSE, CAPILLARY
GLUCOSE-CAPILLARY: 91 mg/dL (ref 65–99)
Glucose-Capillary: 114 mg/dL — ABNORMAL HIGH (ref 65–99)
Glucose-Capillary: 130 mg/dL — ABNORMAL HIGH (ref 65–99)

## 2016-11-25 LAB — TYPE AND SCREEN
ABO/RH(D): O POS
Antibody Screen: NEGATIVE
UNIT DIVISION: 0
UNIT DIVISION: 0
UNIT DIVISION: 0
UNIT DIVISION: 0
Unit division: 0
Unit division: 0
Unit division: 0
Unit division: 0

## 2016-11-25 LAB — CBC
HCT: 22.1 % — ABNORMAL LOW (ref 39.0–52.0)
Hemoglobin: 7.4 g/dL — ABNORMAL LOW (ref 13.0–17.0)
MCH: 30.5 pg (ref 26.0–34.0)
MCHC: 33.5 g/dL (ref 30.0–36.0)
MCV: 90.9 fL (ref 78.0–100.0)
Platelets: 99 10*3/uL — ABNORMAL LOW (ref 150–400)
RBC: 2.43 MIL/uL — ABNORMAL LOW (ref 4.22–5.81)
RDW: 15.3 % (ref 11.5–15.5)
WBC: 9.4 10*3/uL (ref 4.0–10.5)

## 2016-11-25 LAB — BPAM RBC
BLOOD PRODUCT EXPIRATION DATE: 201808232359
BLOOD PRODUCT EXPIRATION DATE: 201808242359
BLOOD PRODUCT EXPIRATION DATE: 201808242359
BLOOD PRODUCT EXPIRATION DATE: 201808242359
BLOOD PRODUCT EXPIRATION DATE: 201808242359
Blood Product Expiration Date: 201808232359
Blood Product Expiration Date: 201808242359
Blood Product Expiration Date: 201808242359
ISSUE DATE / TIME: 201807250714
ISSUE DATE / TIME: 201807250714
ISSUE DATE / TIME: 201807250725
ISSUE DATE / TIME: 201807251657
ISSUE DATE / TIME: 201807251738
ISSUE DATE / TIME: 201807251738
ISSUE DATE / TIME: 201807251741
ISSUE DATE / TIME: 201807251741
UNIT TYPE AND RH: 5100
UNIT TYPE AND RH: 5100
UNIT TYPE AND RH: 5100
UNIT TYPE AND RH: 5100
UNIT TYPE AND RH: 5100
Unit Type and Rh: 5100
Unit Type and Rh: 5100
Unit Type and Rh: 5100

## 2016-11-25 LAB — BASIC METABOLIC PANEL
Anion gap: 5 (ref 5–15)
BUN: 18 mg/dL (ref 6–20)
CO2: 27 mmol/L (ref 22–32)
Calcium: 8.1 mg/dL — ABNORMAL LOW (ref 8.9–10.3)
Chloride: 105 mmol/L (ref 101–111)
Creatinine, Ser: 1.02 mg/dL (ref 0.61–1.24)
GFR calc Af Amer: >60 mL/min (ref >60)
GFR calc non Af Amer: >60 mL/min (ref >60)
Glucose, Bld: 107 mg/dL — ABNORMAL HIGH (ref 65–99)
Potassium: 3.9 mmol/L (ref 3.5–5.1)
Sodium: 137 mmol/L (ref 135–145)

## 2016-11-25 LAB — PROTIME-INR
INR: 1.06
Prothrombin Time: 13.8 seconds (ref 11.4–15.2)

## 2016-11-25 LAB — ECHO INTRAOPERATIVE TEE
Height: 69 in
Weight: 2704 oz

## 2016-11-25 MED ORDER — SODIUM CHLORIDE 0.9 % IV SOLN
INTRAVENOUS | Status: DC
  Administered 2016-11-26: 06:00:00 via INTRAVENOUS

## 2016-11-25 MED ORDER — SODIUM CHLORIDE 0.45 % IV SOLN
INTRAVENOUS | Status: DC
  Administered 2016-11-26: 06:00:00 via INTRAVENOUS

## 2016-11-25 MED ORDER — CHLORHEXIDINE GLUCONATE 4 % EX LIQD
60.0000 mL | Freq: Once | CUTANEOUS | Status: DC
  Filled 2016-11-25: qty 60

## 2016-11-25 MED ORDER — SODIUM CHLORIDE 0.9 % IR SOLN
80.0000 mg | Status: AC
  Administered 2016-11-26: 80 mg

## 2016-11-25 MED ORDER — CEFAZOLIN SODIUM-DEXTROSE 2-4 GM/100ML-% IV SOLN
2.0000 g | INTRAVENOUS | Status: AC
  Administered 2016-11-26: 2 g via INTRAVENOUS

## 2016-11-25 MED ORDER — POTASSIUM CHLORIDE CRYS ER 20 MEQ PO TBCR
20.0000 meq | EXTENDED_RELEASE_TABLET | Freq: Two times a day (BID) | ORAL | Status: DC
  Administered 2016-11-25 – 2016-11-30 (×9): 20 meq via ORAL
  Filled 2016-11-25 (×10): qty 1

## 2016-11-25 NOTE — Progress Notes (Signed)
Electrophysiology Rounding Note  Patient Name: Dakota Martin Date of Encounter: 11/25/2016  Primary Cardiologist: Dr Acie Fredrickson Electrophysiologist: Dr Lovena Le   Subjective   The patient is doing reasonably well.  He remains ill.  Remains SOB.  Inpatient Medications    Scheduled Meds: . acetaminophen  1,000 mg Oral Q6H   Or  . acetaminophen (TYLENOL) oral liquid 160 mg/5 mL  1,000 mg Per Tube Q6H  . allopurinol  300 mg Oral Q1200  . aspirin EC  81 mg Oral Daily  . atorvastatin  10 mg Oral q1800  . bisacodyl  10 mg Oral Daily   Or  . bisacodyl  10 mg Rectal Daily  . docusate sodium  200 mg Oral Daily  . ezetimibe  5 mg Oral Daily  . furosemide  40 mg Intravenous Daily  . insulin aspart  0-15 Units Subcutaneous TID WC  . mouth rinse  15 mL Mouth Rinse BID  . mupirocin ointment  1 application Nasal BID  . pantoprazole  40 mg Oral Daily  . potassium chloride  20 mEq Oral BID  . sodium chloride flush  10-40 mL Intracatheter Q12H  . sodium chloride flush  3 mL Intravenous Q12H  . warfarin  5 mg Oral q1800  . Warfarin - Physician Dosing Inpatient   Does not apply q1800   Continuous Infusions: . sodium chloride Stopped (11/24/16 0710)  . sodium chloride    . sodium chloride 20 mL/hr at 11/25/16 0800  . albumin human    . lactated ringers    . lactated ringers Stopped (11/22/16 1200)   PRN Meds: sodium chloride, albumin human, ondansetron (ZOFRAN) IV, oxybutynin, oxyCODONE, potassium chloride, sodium chloride flush, sodium chloride flush, traMADol, zolpidem   Vital Signs    Vitals:   11/25/16 0500 11/25/16 0600 11/25/16 0700 11/25/16 0800  BP: (!) 128/59 121/69 (!) 131/57 (!) 124/54  Pulse: 80 80 80 86  Resp: (!) 23 20 (!) 21 (!) 22  Temp:   99.2 F (37.3 C)   TempSrc:   Oral   SpO2: 97% 97% 98% 96%  Weight:      Height:        Intake/Output Summary (Last 24 hours) at 11/25/16 0855 Last data filed at 11/25/16 0800  Gross per 24 hour  Intake              1320 ml  Output             2925 ml  Net            -1605 ml   Filed Weights   11/23/16 0500 11/24/16 0500 11/25/16 0400  Weight: 191 lb (86.6 kg) 193 lb 5.5 oz (87.7 kg) 190 lb 12.8 oz (86.5 kg)    Physical Exam    GEN- The patient is well appearing, alert and oriented x 3 today.   Head- normocephalic, atraumatic Eyes-  Sclera clear, conjunctiva pink Ears- hearing intact Oropharynx- clear Neck- supple, R neck CVL in place Lungs- decreased BS at bases normal work of breathing, wearing O2 Heart- Regular rate and rhythm, no murmurs, rubs or gallops GI- soft, NT, ND, + BS Extremities- no clubbing, cyanosis, + depedant edema Skin- no rash or lesion Psych- euthymic mood, full affect Neuro- strength and sensation are intact  Labs    CBC  Recent Labs  11/24/16 0615 11/25/16 0358  WBC 11.7* 9.4  HGB 8.0* 7.4*  HCT 23.9* 22.1*  MCV 89.8 90.9  PLT 73* 99*  Basic Metabolic Panel  Recent Labs  11/22/16 1530  11/24/16 0615 11/25/16 0358  NA  --   < > 135 137  K  --   < > 3.6 3.9  CL  --   < > 101 105  CO2  --   < > 25 27  GLUCOSE  --   < > 120* 107*  BUN  --   < > 17 18  CREATININE 1.16  < > 0.98 1.02  CALCIUM  --   < > 7.7* 8.1*  MG 2.2  --   --   --   < > = values in this interval not displayed.   Telemetry    AV paced (personally reviewed)  Radiology    Dg Chest Port 1 View  Result Date: 11/25/2016 CLINICAL DATA:  Post aortic valve repair EXAM: PORTABLE CHEST 1 VIEW COMPARISON:  11/24/2016 FINDINGS: Changes of CABG and aortic valve replacement. Cardiomegaly with vascular congestion. Bilateral layering effusions, left greater than right with bibasilar atelectasis. Findings similar to prior study. IMPRESSION: Stable bilateral effusions and bibasilar atelectasis, left greater than right. Stable cardiomegaly and vascular congestion. Electronically Signed   By: Rolm Baptise M.D.   On: 11/25/2016 07:53   Dg Chest Port 1 View  Result Date: 11/24/2016 CLINICAL  DATA:  Ascending aortic aneurysm EXAM: PORTABLE CHEST 1 VIEW COMPARISON:  11/23/2016 FINDINGS: Prior CABG and valve replacement. Cardiomegaly. Bilateral layering effusions with bibasilar atelectasis. No overt edema. No pneumothorax. IMPRESSION: Layering bilateral effusions and bibasilar atelectasis. Findings similar to prior study. Electronically Signed   By: Rolm Baptise M.D.   On: 11/24/2016 08:01     Patient Profile      Dakota Martin is a 76 y.o. male.  EP is consulted for complete heart block  Assessment & Plan    1.  Complete heart block s/p AVR Anticipate that PPM will be required.  We discussed this at length today. Risks, benefits, alternatives to pacemaker implantation were discussed in detail with the patient today. The patient understands that the risks include but are not limited to bleeding, infection, pneumothorax, perforation, tamponade, vascular damage, renal failure, MI, stroke, death,  and lead dislodgement and wishes to proceed.   Orders are placed.  Will ask primary team to remove central line today if possible to reduce infectious risks. OK to continue coumadin  2.  S/p AAA repair and AVR Per TCTS  3. CAD s.p CABG Stable No change required today  Thompson Grayer MD, [facility] 11/25/2016 8:58 AM

## 2016-11-25 NOTE — Anesthesia Postprocedure Evaluation (Signed)
Anesthesia Post Note  Patient: Dakota Martin  Procedure(s) Performed: Procedure(s) (LRB): REDO STERNOTOMY (N/A) REDO AORTIC VALVE REPLACEMENT (AVR) (N/A) THORACIC ASCENDING ANEURYSM REPAIR (AAA) (N/A) TRANSESOPHAGEAL ECHOCARDIOGRAM (TEE) (N/A) CORONARY ARTERY BYPASS GRAFTING (CABG) x 2, using right leg greater saphenous vein harvested endoscopically (N/A)     Patient location during evaluation: SICU Anesthesia Type: General Level of consciousness: sedated Pain management: pain level controlled Vital Signs Assessment: post-procedure vital signs reviewed and stable Respiratory status: patient remains intubated per anesthesia plan Cardiovascular status: stable Anesthetic complications: no Comments: Patient did not receive two different classes of anti-emetics as plan was to proceed to ICU intubated, sedated following CABG surgery.    Last Vitals:  Vitals:   11/25/16 0900 11/25/16 1000  BP: (!) 124/58 (!) 123/56  Pulse: 81 79  Resp: 18 20  Temp:      Last Pain:  Vitals:   11/25/16 0700  TempSrc: Oral  PainSc:                  Catalina Gravel

## 2016-11-25 NOTE — Progress Notes (Signed)
4 Days Post-Op Procedure(s) (LRB): REDO STERNOTOMY (N/A) REDO AORTIC VALVE REPLACEMENT (AVR) (N/A) THORACIC ASCENDING ANEURYSM REPAIR (AAA) (N/A) TRANSESOPHAGEAL ECHOCARDIOGRAM (TEE) (N/A) CORONARY ARTERY BYPASS GRAFTING (CABG) x 2, using right leg greater saphenous vein harvested endoscopically (N/A) Subjective: Patient remains AV paced with stable blood pressure Oxygen has been weaned Excellent urine output-patient lost 3 pounds from yesterday Patient seen by Dr. Rayann Heman appreciate his input in helping patient with permanent pacemaker scheduled for tomorrow INR still not increased as Coumadin is started  Objective: Vital signs in last 24 hours: Temp:  [98.2 F (36.8 C)-99.2 F (37.3 C)] 98.3 F (36.8 C) (07/29 1600) Pulse Rate:  [79-86] 80 (07/29 1700) Cardiac Rhythm: A-V Sequential paced;Bundle branch block (07/29 0800) Resp:  [16-25] 25 (07/29 1700) BP: (108-137)/(49-69) 134/49 (07/29 1700) SpO2:  [92 %-98 %] 92 % (07/29 1700) Weight:  [190 lb 12.8 oz (86.5 kg)] 190 lb 12.8 oz (86.5 kg) (07/29 0400)  Hemodynamic parameters for last 24 hours:    Intake/Output from previous day: 07/28 0701 - 07/29 0700 In: 1416.7 [P.O.:960; I.V.:456.7] Out: 3300 [Urine:3300] Intake/Output this shift: Total I/O In: 847.3 [P.O.:720; I.V.:127.3] Out: 1550 [MVEHM:0947]       Exam    General- alert and comfortable   Lungs- clear without rales, wheezes   Cor- regular rate and rhythm, no murmur , gallop   Abdomen- soft, non-tender   Extremities - warm, non-tender, minimal edema   Neuro- oriented, appropriate, no focal weakness   Lab Results:  Recent Labs  11/24/16 0615 11/25/16 0358  WBC 11.7* 9.4  HGB 8.0* 7.4*  HCT 23.9* 22.1*  PLT 73* 99*   BMET:  Recent Labs  11/24/16 0615 11/25/16 0358  NA 135 137  K 3.6 3.9  CL 101 105  CO2 25 27  GLUCOSE 120* 107*  BUN 17 18  CREATININE 0.98 1.02  CALCIUM 7.7* 8.1*    PT/INR:  Recent Labs  11/25/16 0358  LABPROT 13.8   INR 1.06   ABG    Component Value Date/Time   PHART 7.422 11/22/2016 0222   HCO3 22.8 11/22/2016 0222   TCO2 22 11/22/2016 1540   ACIDBASEDEF 1.0 11/22/2016 0222   O2SAT 94.0 11/22/2016 0222   CBG (last 3)   Recent Labs  11/24/16 2103 11/25/16 0759 11/25/16 1145  GLUCAP 118* 91 114*    Assessment/Plan: S/P Procedure(s) (LRB): REDO STERNOTOMY (N/A) REDO AORTIC VALVE REPLACEMENT (AVR) (N/A) THORACIC ASCENDING ANEURYSM REPAIR (AAA) (N/A) TRANSESOPHAGEAL ECHOCARDIOGRAM (TEE) (N/A) CORONARY ARTERY BYPASS GRAFTING (CABG) x 2, using right leg greater saphenous vein harvested endoscopically (N/A) Continue diuresis Patient nothing by mouth after midnight We'll send type and screen in a.m. as patient has developed further anemia but not Indication for transfusion at this time   LOS: 4 days    Tharon Aquas Trigt III 11/25/2016

## 2016-11-26 ENCOUNTER — Encounter (HOSPITAL_COMMUNITY): Payer: Self-pay | Admitting: Internal Medicine

## 2016-11-26 ENCOUNTER — Inpatient Hospital Stay (HOSPITAL_COMMUNITY)
Admission: RE | Disposition: A | Discharge status: Home / Self Care | Payer: Self-pay | Source: Ambulatory Visit | Attending: Thoracic Surgery (Cardiothoracic Vascular Surgery)

## 2016-11-26 ENCOUNTER — Inpatient Hospital Stay (HOSPITAL_COMMUNITY): Payer: Medicare Other

## 2016-11-26 DIAGNOSIS — M7989 Other specified soft tissue disorders: Secondary | ICD-10-CM

## 2016-11-26 HISTORY — PX: PACEMAKER IMPLANT: EP1218

## 2016-11-26 LAB — CBC
HCT: 22.5 % — ABNORMAL LOW (ref 39.0–52.0)
Hemoglobin: 7.4 g/dL — ABNORMAL LOW (ref 13.0–17.0)
MCH: 30.2 pg (ref 26.0–34.0)
MCHC: 32.9 g/dL (ref 30.0–36.0)
MCV: 91.8 fL (ref 78.0–100.0)
Platelets: 105 10*3/uL — ABNORMAL LOW (ref 150–400)
RBC: 2.45 MIL/uL — ABNORMAL LOW (ref 4.22–5.81)
RDW: 15.3 % (ref 11.5–15.5)
WBC: 9.2 10*3/uL (ref 4.0–10.5)

## 2016-11-26 LAB — GLUCOSE, CAPILLARY
GLUCOSE-CAPILLARY: 122 mg/dL — AB (ref 65–99)
GLUCOSE-CAPILLARY: 115 mg/dL — AB (ref 65–99)
Glucose-Capillary: 102 mg/dL — ABNORMAL HIGH (ref 65–99)
Glucose-Capillary: 120 mg/dL — ABNORMAL HIGH (ref 65–99)

## 2016-11-26 LAB — TYPE AND SCREEN
ABO/RH(D): O POS
Antibody Screen: NEGATIVE

## 2016-11-26 LAB — BASIC METABOLIC PANEL
Anion gap: 7 (ref 5–15)
BUN: 20 mg/dL (ref 6–20)
CO2: 26 mmol/L (ref 22–32)
Calcium: 8.2 mg/dL — ABNORMAL LOW (ref 8.9–10.3)
Chloride: 105 mmol/L (ref 101–111)
Creatinine, Ser: 1.05 mg/dL (ref 0.61–1.24)
GFR calc Af Amer: >60 mL/min (ref >60)
GFR calc non Af Amer: >60 mL/min (ref >60)
Glucose, Bld: 109 mg/dL — ABNORMAL HIGH (ref 65–99)
Potassium: 4.4 mmol/L (ref 3.5–5.1)
Sodium: 138 mmol/L (ref 135–145)

## 2016-11-26 LAB — PROTIME-INR
INR: 1.06
Prothrombin Time: 13.9 seconds (ref 11.4–15.2)

## 2016-11-26 SURGERY — PACEMAKER IMPLANT
Anesthesia: LOCAL

## 2016-11-26 MED ORDER — HEPARIN SODIUM (PORCINE) 5000 UNIT/ML IJ SOLN
5000.0000 [IU] | Freq: Three times a day (TID) | INTRAMUSCULAR | Status: DC
  Administered 2016-11-26 – 2016-11-27 (×3): 5000 [IU] via SUBCUTANEOUS
  Filled 2016-11-26 (×3): qty 1

## 2016-11-26 MED ORDER — MIDAZOLAM HCL 5 MG/5ML IJ SOLN
INTRAMUSCULAR | Status: DC | PRN
  Administered 2016-11-26: 2 mg via INTRAVENOUS
  Administered 2016-11-26: 1 mg via INTRAVENOUS

## 2016-11-26 MED ORDER — HEPARIN SODIUM (PORCINE) 1000 UNIT/ML IJ SOLN
INTRAMUSCULAR | Status: AC
  Filled 2016-11-26: qty 1

## 2016-11-26 MED ORDER — LIDOCAINE HCL (PF) 1 % IJ SOLN
INTRAMUSCULAR | Status: AC
  Filled 2016-11-26: qty 60

## 2016-11-26 MED ORDER — HEPARIN (PORCINE) IN NACL 2-0.9 UNIT/ML-% IJ SOLN
INTRAMUSCULAR | Status: AC | PRN
  Administered 2016-11-26: 500 mL

## 2016-11-26 MED ORDER — IOPAMIDOL (ISOVUE-370) INJECTION 76%
INTRAVENOUS | Status: AC
  Filled 2016-11-26: qty 50

## 2016-11-26 MED ORDER — ACETAMINOPHEN 325 MG PO TABS
325.0000 mg | ORAL_TABLET | ORAL | Status: DC | PRN
  Administered 2016-11-27: 650 mg via ORAL
  Filled 2016-11-26: qty 2

## 2016-11-26 MED ORDER — SODIUM CHLORIDE 0.9 % IV SOLN: INTRAVENOUS | Status: AC

## 2016-11-26 MED ORDER — CEFAZOLIN SODIUM-DEXTROSE 2-4 GM/100ML-% IV SOLN
INTRAVENOUS | Status: AC
  Filled 2016-11-26: qty 100

## 2016-11-26 MED ORDER — LIDOCAINE HCL (PF) 1 % IJ SOLN
INTRAMUSCULAR | Status: DC | PRN
  Administered 2016-11-26: 45 mL

## 2016-11-26 MED ORDER — FENTANYL CITRATE (PF) 100 MCG/2ML IJ SOLN
INTRAMUSCULAR | Status: DC | PRN
  Administered 2016-11-26: 50 ug via INTRAVENOUS

## 2016-11-26 MED ORDER — HEPARIN (PORCINE) IN NACL 2-0.9 UNIT/ML-% IJ SOLN
INTRAMUSCULAR | Status: AC
  Filled 2016-11-26: qty 500

## 2016-11-26 MED ORDER — FENTANYL CITRATE (PF) 100 MCG/2ML IJ SOLN
INTRAMUSCULAR | Status: AC
  Filled 2016-11-26: qty 2

## 2016-11-26 MED ORDER — CEFAZOLIN SODIUM-DEXTROSE 1-4 GM/50ML-% IV SOLN
1.0000 g | Freq: Four times a day (QID) | INTRAVENOUS | Status: AC
  Administered 2016-11-26 – 2016-11-27 (×3): 1 g via INTRAVENOUS
  Filled 2016-11-26 (×3): qty 50

## 2016-11-26 MED ORDER — MIDAZOLAM HCL 5 MG/5ML IJ SOLN
INTRAMUSCULAR | Status: AC
  Filled 2016-11-26: qty 5

## 2016-11-26 MED ORDER — ONDANSETRON HCL 4 MG/2ML IJ SOLN: 4.0000 mg | Freq: Four times a day (QID) | INTRAMUSCULAR | Status: DC | PRN

## 2016-11-26 MED ORDER — SODIUM CHLORIDE 0.9 % IR SOLN
Status: AC
  Filled 2016-11-26: qty 2

## 2016-11-26 SURGICAL SUPPLY — 9 items
CABLE SURGICAL S-101-97-12 (CABLE) ×1 IMPLANT
HEMOSTAT SURGICEL 2X4 FIBR (HEMOSTASIS) ×1 IMPLANT
IPG PACE AZUR XT DR MRI W1DR01 (Pacemaker) IMPLANT
LEAD CAPSURE NOVUS 5076-52CM (Lead) ×1 IMPLANT
LEAD CAPSURE NOVUS 5076-58CM (Lead) ×1 IMPLANT
PACE AZURE XT DR MRI W1DR01 (Pacemaker) ×2 IMPLANT
PAD DEFIB LIFELINK (PAD) ×1 IMPLANT
SHEATH CLASSIC 7F (SHEATH) ×2 IMPLANT
TRAY PACEMAKER INSERTION (PACKS) ×1 IMPLANT

## 2016-11-26 NOTE — Progress Notes (Signed)
5 Days Post-Op Procedure(s) (LRB): REDO STERNOTOMY (N/A) REDO AORTIC VALVE REPLACEMENT (AVR) (N/A) THORACIC ASCENDING ANEURYSM REPAIR (AAA) (N/A) TRANSESOPHAGEAL ECHOCARDIOGRAM (TEE) (N/A) CORONARY ARTERY BYPASS GRAFTING (CABG) x 2, using right leg greater saphenous vein harvested endoscopically (N/A) Subjective: Not sleeping well  Objective: Vital signs in last 24 hours: Temp:  [98.1 F (36.7 C)-98.4 F (36.9 C)] 98.1 F (36.7 C) (07/30 0725) Pulse Rate:  [79-86] 81 (07/30 0725) Cardiac Rhythm: A-V Sequential paced (07/30 0400) Resp:  [17-28] 21 (07/30 0700) BP: (103-142)/(46-61) 134/58 (07/30 0725) SpO2:  [89 %-98 %] 95 % (07/30 0725) Weight:  [187 lb 11.2 oz (85.1 kg)] 187 lb 11.2 oz (85.1 kg) (07/30 0400)  Hemodynamic parameters for last 24 hours:    Intake/Output from previous day: 07/29 0701 - 07/30 0700 In: 967.3 [P.O.:840; I.V.:127.3] Out: 2225 [Urine:2225] Intake/Output this shift: No intake/output data recorded.  General appearance: alert, cooperative and no distress Neurologic: intact Heart: regular rate and rhythm Lungs: clear to auscultation bilaterally Abdomen: normal findings: soft, non-tender Wound: clean and dry  Lab Results:  Recent Labs  11/25/16 0358 11/26/16 0410  WBC 9.4 9.2  HGB 7.4* 7.4*  HCT 22.1* 22.5*  PLT 99* 105*   BMET:  Recent Labs  11/25/16 0358 11/26/16 0410  NA 137 138  K 3.9 4.4  CL 105 105  CO2 27 26  GLUCOSE 107* 109*  BUN 18 20  CREATININE 1.02 1.05  CALCIUM 8.1* 8.2*    PT/INR:  Recent Labs  11/26/16 0410  LABPROT 13.9  INR 1.06   ABG    Component Value Date/Time   PHART 7.422 11/22/2016 0222   HCO3 22.8 11/22/2016 0222   TCO2 22 11/22/2016 1540   ACIDBASEDEF 1.0 11/22/2016 0222   O2SAT 94.0 11/22/2016 0222   CBG (last 3)   Recent Labs  11/25/16 0759 11/25/16 1145 11/25/16 2155  GLUCAP 91 114* 130*    Assessment/Plan: S/P Procedure(s) (LRB): REDO STERNOTOMY (N/A) REDO AORTIC VALVE  REPLACEMENT (AVR) (N/A) THORACIC ASCENDING ANEURYSM REPAIR (AAA) (N/A) TRANSESOPHAGEAL ECHOCARDIOGRAM (TEE) (N/A) CORONARY ARTERY BYPASS GRAFTING (CABG) x 2, using right leg greater saphenous vein harvested endoscopically (N/A) -CV- CHB persists- for pacemaker this AM  on coumadin - INR has not bumped yet  RESP- on room air, continue IS  RENAL- creatinine normal, lytes OK  Continue diuresis  ENDO- CBG well controlled  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

## 2016-11-26 NOTE — Progress Notes (Signed)
Electrophysiology Rounding Note  Patient Name: Dakota Martin Date of Encounter: 11/26/2016  Primary Cardiologist: Dr Acie Fredrickson Electrophysiologist: Dr Lovena Le   Subjective   The patient is doing reasonably well.denies sob  Aware of plans and understands need   Inpatient Medications    Scheduled Meds: . acetaminophen  1,000 mg Oral Q6H   Or  . acetaminophen (TYLENOL) oral liquid 160 mg/5 mL  1,000 mg Per Tube Q6H  . allopurinol  300 mg Oral Q1200  . aspirin EC  81 mg Oral Daily  . atorvastatin  10 mg Oral q1800  . bisacodyl  10 mg Oral Daily   Or  . bisacodyl  10 mg Rectal Daily  . chlorhexidine  60 mL Topical Once  . chlorhexidine  60 mL Topical Once  . docusate sodium  200 mg Oral Daily  . ezetimibe  5 mg Oral Daily  . furosemide  40 mg Intravenous Daily  . gentamicin irrigation  80 mg Irrigation To Cath  . mouth rinse  15 mL Mouth Rinse BID  . mupirocin ointment  1 application Nasal BID  . pantoprazole  40 mg Oral Daily  . potassium chloride  20 mEq Oral BID  . sodium chloride flush  3 mL Intravenous Q12H  . warfarin  5 mg Oral q1800  . Warfarin - Physician Dosing Inpatient   Does not apply q1800   Continuous Infusions: . sodium chloride 10 mL/hr at 11/26/16 0618  . sodium chloride    . sodium chloride 10 mL/hr at 11/26/16 0618  . albumin human    .  ceFAZolin (ANCEF) IV     PRN Meds: albumin human, ondansetron (ZOFRAN) IV, oxybutynin, oxyCODONE, potassium chloride, sodium chloride flush, traMADol, zolpidem   Vital Signs    Vitals:   11/26/16 0500 11/26/16 0600 11/26/16 0700 11/26/16 0725  BP: (!) 142/48 (!) 124/54 (!) 134/58 (!) 134/58  Pulse: 80 81 80 81  Resp: (!) 23 20 (!) 21   Temp:    98.1 F (36.7 C)  TempSrc:    Oral  SpO2: 95% 95% (!) 89% 95%  Weight:      Height:        Intake/Output Summary (Last 24 hours) at 11/26/16 0730 Last data filed at 11/26/16 0618  Gross per 24 hour  Intake           967.33 ml  Output             2225  ml  Net         -1257.67 ml   Filed Weights   11/24/16 0500 11/25/16 0400 11/26/16 0400  Weight: 193 lb 5.5 oz (87.7 kg) 190 lb 12.8 oz (86.5 kg) 187 lb 11.2 oz (85.1 kg)    Physical Exam  Well developed and nourished in no acute distress HENT normal Neck supple with JVP-flat Carotids brisk and full without bruits Clear Regular rate and rhythm, no murmurs or gallops Abd-soft with active BS without hepatomegaly No Clubbing cyanosis asymmetric edema R 2+  L 0 Skin-warm and dry A & Oriented  Grossly normal sensory and motor function   Labs    CBC  Recent Labs  11/25/16 0358 11/26/16 0410  WBC 9.4 9.2  HGB 7.4* 7.4*  HCT 22.1* 22.5*  MCV 90.9 91.8  PLT 99* 163*   Basic Metabolic Panel  Recent Labs  11/25/16 0358 11/26/16 0410  NA 137 138  K 3.9 4.4  CL 105 105  CO2 27 26  GLUCOSE 107*  109*  BUN 18 20  CREATININE 1.02 1.05  CALCIUM 8.1* 8.2*     Telemetry    Telemetry Personally reviewed  P-synchronous/ AV  pacing Underlying rhtyhm is CHB   Radiology    Dg Chest Port 1 View  Result Date: 11/26/2016 CLINICAL DATA:  Chest soreness following aortic valve replacement. EXAM: PORTABLE CHEST 1 VIEW COMPARISON:  11/25/2016 FINDINGS: Previous median sternotomy. Re- due to aortic valve replacement and CABG. Cardiomegaly as seen previously. Aortic atherosclerosis. Persistent effusions left larger than right with lower lung volume loss left worse than right. Upper lungs are clear. No pulmonary edema. Right internal jugular venous access sheath has been removed. No unexpected bone finding. IMPRESSION: Previous AVR and CABG. Persistent effusions left larger than right with lower lobe volume loss left worse than right. Electronically Signed   By: Nelson Chimes M.D.   On: 11/26/2016 07:18   Dg Chest Port 1 View  Result Date: 11/25/2016 CLINICAL DATA:  Post aortic valve repair EXAM: PORTABLE CHEST 1 VIEW COMPARISON:  11/24/2016 FINDINGS: Changes of CABG and aortic valve  replacement. Cardiomegaly with vascular congestion. Bilateral layering effusions, left greater than right with bibasilar atelectasis. Findings similar to prior study. IMPRESSION: Stable bilateral effusions and bibasilar atelectasis, left greater than right. Stable cardiomegaly and vascular congestion. Electronically Signed   By: Rolm Baptise M.D.   On: 11/25/2016 07:53     Patient Profile      Dakota Martin is a 76 y.o. male.  EP is consulted for complete heart block  Assessment & Plan  CHB S/p ReDo AVR + CABG Asymmetric edema Anemia  .for pacer this am The benefits and risks were reviewed including but not limited to death,  perforation, infection, lead dislodgement and device malfunction.  The patient understands agrees and is willing to proceed.  Will do dopplers given asymmetric edema and subtherapeutic INR  Anemia per TCTS-- following without indication for transfusion

## 2016-11-26 NOTE — Progress Notes (Signed)
Preliminary results by tech - Venous Duplex Lower Ext. Completed. No evidence of deep vein thrombosis in both legs. There is a small amount of thrombus visualized in the right  proximal greater saphenous vein that was harvested, [employer], BS, RDMS, RVT

## 2016-11-26 NOTE — Progress Notes (Signed)
      [address]       [place] [address]             [phone]      Uncomfortable due to sling  BP (!) 141/64   Pulse 89   Temp 98 F (36.7 C) (Oral)   Resp (!) 23   Ht 5\' 9"  (1.753 m)   Wt 187 lb 11.2 oz (85.1 kg)   SpO2 94%   BMI 27.72 kg/m    Intake/Output Summary (Last 24 hours) at 11/26/16 1841 Last data filed at 11/26/16 1600  Gross per 24 hour  Intake              650 ml  Output             1155 ml  Net             -505 ml    DDD paced, in SR underneath  [place] C. Roxan Hockey, MD [facility] [phone]

## 2016-11-27 ENCOUNTER — Inpatient Hospital Stay (HOSPITAL_COMMUNITY): Payer: Medicare Other

## 2016-11-27 LAB — GLUCOSE, CAPILLARY: Glucose-Capillary: 97 mg/dL (ref 65–99)

## 2016-11-27 LAB — PROTIME-INR
INR: 1.12
PROTHROMBIN TIME: 14.5 s (ref 11.4–15.2)

## 2016-11-27 MED ORDER — BENZONATATE 100 MG PO CAPS
200.0000 mg | ORAL_CAPSULE | Freq: Three times a day (TID) | ORAL | Status: DC | PRN
  Administered 2016-11-27: 200 mg via ORAL
  Filled 2016-11-27: qty 2

## 2016-11-27 MED ORDER — FUROSEMIDE 40 MG PO TABS
40.0000 mg | ORAL_TABLET | Freq: Two times a day (BID) | ORAL | Status: DC
Start: 2016-11-27 — End: 2016-11-28
  Administered 2016-11-27 (×2): 40 mg via ORAL
  Filled 2016-11-27 (×2): qty 1

## 2016-11-27 MED ORDER — WARFARIN SODIUM 7.5 MG PO TABS
7.5000 mg | ORAL_TABLET | ORAL | Status: DC
  Administered 2016-11-28: 7.5 mg via ORAL
  Filled 2016-11-27: qty 1

## 2016-11-27 MED ORDER — SODIUM CHLORIDE 0.9 % IV SOLN
250.0000 mL | INTRAVENOUS | Status: DC | PRN
Start: 2016-11-27 — End: 2016-11-30

## 2016-11-27 MED ORDER — GUAIFENESIN-DM 100-10 MG/5ML PO SYRP: 15.0000 mL | ORAL_SOLUTION | ORAL | Status: DC | PRN

## 2016-11-27 MED ORDER — SODIUM CHLORIDE 0.9% FLUSH: 3.0000 mL | INTRAVENOUS | Status: DC | PRN

## 2016-11-27 MED ORDER — HYDROCHLOROTHIAZIDE 12.5 MG PO CAPS
12.5000 mg | ORAL_CAPSULE | Freq: Every day | ORAL | Status: DC
  Administered 2016-11-27 – 2016-11-30 (×4): 12.5 mg via ORAL
  Filled 2016-11-27 (×4): qty 1

## 2016-11-27 MED ORDER — ALUM & MAG HYDROXIDE-SIMETH 200-200-20 MG/5ML PO SUSP: 15.0000 mL | Freq: Four times a day (QID) | ORAL | Status: DC | PRN

## 2016-11-27 MED ORDER — SODIUM CHLORIDE 0.9% FLUSH
3.0000 mL | Freq: Two times a day (BID) | INTRAVENOUS | Status: DC
  Administered 2016-11-28 – 2016-11-29 (×3): 3 mL via INTRAVENOUS

## 2016-11-27 MED ORDER — IRBESARTAN 300 MG PO TABS
300.0000 mg | ORAL_TABLET | Freq: Every day | ORAL | Status: DC
  Administered 2016-11-27 – 2016-11-30 (×4): 300 mg via ORAL
  Filled 2016-11-27 (×5): qty 1

## 2016-11-27 MED ORDER — MAGNESIUM HYDROXIDE 400 MG/5ML PO SUSP: 30.0000 mL | Freq: Every day | ORAL | Status: DC | PRN

## 2016-11-27 MED ORDER — MOVING RIGHT ALONG BOOK
Freq: Once | Status: DC
  Filled 2016-11-27: qty 1

## 2016-11-27 MED ORDER — MAGNESIUM OXIDE 400 (241.3 MG) MG PO TABS
400.0000 mg | ORAL_TABLET | Freq: Every evening | ORAL | Status: DC
Start: 2016-11-27 — End: 2016-11-30
  Administered 2016-11-27 – 2016-11-29 (×3): 400 mg via ORAL
  Filled 2016-11-27 (×3): qty 1

## 2016-11-27 MED ORDER — WARFARIN SODIUM 5 MG PO TABS
5.0000 mg | ORAL_TABLET | ORAL | Status: DC
  Administered 2016-11-27 – 2016-11-29 (×2): 5 mg via ORAL
  Filled 2016-11-27 (×2): qty 1

## 2016-11-27 MED ORDER — CARVEDILOL 6.25 MG PO TABS
6.2500 mg | ORAL_TABLET | Freq: Two times a day (BID) | ORAL | Status: DC
Start: 2016-11-27 — End: 2016-11-30
  Administered 2016-11-27 – 2016-11-30 (×7): 6.25 mg via ORAL
  Filled 2016-11-27 (×7): qty 1

## 2016-11-27 MED FILL — Gentamicin Sulfate Inj 40 MG/ML: INTRAMUSCULAR | Qty: 2 | Status: AC

## 2016-11-27 MED FILL — Sodium Chloride Irrigation Soln 0.9%: Qty: 500 | Status: AC

## 2016-11-27 MED FILL — Cefazolin Sodium-Dextrose IV Solution 2 GM/100ML-4%: INTRAVENOUS | Qty: 100 | Status: AC

## 2016-11-27 NOTE — Progress Notes (Addendum)
Pacer and wires removed per MD order. Awaiting bed for transfer. Modena Morrow E, RN 11/27/2016 12:01 PM

## 2016-11-27 NOTE — Progress Notes (Signed)
1 Day Post-Op Procedure(s) (LRB): Pacemaker Implant (N/A) Subjective: C/o cough with IS  Objective: Vital signs in last 24 hours: Temp:  [97.8 F (36.6 C)-99.1 F (37.3 C)] 98 F (36.7 C) (07/31 0806) Pulse Rate:  [0-151] 94 (07/31 0700) Cardiac Rhythm: Ventricular paced (07/31 0751) Resp:  [0-28] 24 (07/31 0700) BP: (98-154)/(39-78) 149/60 (07/31 0540) SpO2:  [0 %-98 %] 95 % (07/31 0700) Weight:  [189 lb 6.4 oz (85.9 kg)] 189 lb 6.4 oz (85.9 kg) (07/31 0350)  Hemodynamic parameters for last 24 hours:    Intake/Output from previous day: 07/30 0701 - 07/31 0700 In: 870 [P.O.:720; IV Piggyback:150] Out: 2120 [Urine:2120] Intake/Output this shift: Total I/O In: -  Out: 100 [Urine:100]  General appearance: alert, cooperative and no distress Neurologic: intact Heart: regular rate and rhythm Lungs: diminished breath sounds bibasilar Abdomen: normal findings: soft, non-tender Wound: clean and dry  Lab Results:  Recent Labs  11/25/16 0358 11/26/16 0410  WBC 9.4 9.2  HGB 7.4* 7.4*  HCT 22.1* 22.5*  PLT 99* 105*   BMET:  Recent Labs  11/25/16 0358 11/26/16 0410  NA 137 138  K 3.9 4.4  CL 105 105  CO2 27 26  GLUCOSE 107* 109*  BUN 18 20  CREATININE 1.02 1.05  CALCIUM 8.1* 8.2*    PT/INR:  Recent Labs  11/27/16 0236  LABPROT 14.5  INR 1.12   ABG    Component Value Date/Time   PHART 7.422 11/22/2016 0222   HCO3 22.8 11/22/2016 0222   TCO2 22 11/22/2016 1540   ACIDBASEDEF 1.0 11/22/2016 0222   O2SAT 94.0 11/22/2016 0222   CBG (last 3)   Recent Labs  11/26/16 1607 11/26/16 2133 11/27/16 0809  GLUCAP 115* 120* 97    Assessment/Plan: S/P Procedure(s) (LRB): Pacemaker Implant (N/A) Plan for transfer to step-down: see transfer orders  CV- DDD paced with underlying SR  INR still low- resume home coumadin dose  Hypertension- restart home meds  RESP- cough with IS- start robitussin PRN  RENAL- creatinine stable, still has excess volume-  change lasix to 40 mg BID  ENDO- CBG well controlled  Ambulating well. Will dc subq heparin at patient request  Transfer to 4E   LOS: 6 days    Melrose Nakayama 11/27/2016

## 2016-11-27 NOTE — Progress Notes (Signed)
Pacemaker wound care, activity restriction were discussed with the patient and his wife.  Device follow up has been arranged.  Tommye Standard, PA-C

## 2016-11-27 NOTE — Progress Notes (Signed)
Electrophysiology Rounding Note  Patient Name: Dakota Martin Date of Encounter: 11/27/2016  Primary Cardiologist: Dr Acie Fredrickson Electrophysiologist: Dr Lovena Le   Subjective   No chest pain or sob. Anxious to get temp pacing wires out.   Inpatient Medications    Scheduled Meds: . allopurinol  300 mg Oral Q1200  . aspirin EC  81 mg Oral Daily  . atorvastatin  10 mg Oral q1800  . bisacodyl  10 mg Oral Daily   Or  . bisacodyl  10 mg Rectal Daily  . docusate sodium  200 mg Oral Daily  . ezetimibe  5 mg Oral Daily  . furosemide  40 mg Intravenous Daily  . heparin subcutaneous  5,000 Units Subcutaneous Q8H  . mouth rinse  15 mL Mouth Rinse BID  . mupirocin ointment  1 application Nasal BID  . pantoprazole  40 mg Oral Daily  . potassium chloride  20 mEq Oral BID  . sodium chloride flush  3 mL Intravenous Q12H  . warfarin  5 mg Oral q1800  . Warfarin - Physician Dosing Inpatient   Does not apply q1800   Continuous Infusions: . sodium chloride    . albumin human     PRN Meds: acetaminophen, albumin human, ondansetron (ZOFRAN) IV, oxybutynin, oxyCODONE, potassium chloride, sodium chloride flush, traMADol, zolpidem   Vital Signs    Vitals:   11/27/16 0200 11/27/16 0350 11/27/16 0408 11/27/16 0540  BP: (!) 124/39  (!) 144/57 (!) 149/60  Pulse: (!) 104  95 94  Resp: (!) 24  (!) 22 (!) 23  Temp:  98.4 F (36.9 C)    TempSrc:  Oral    SpO2: 94%  96% 93%  Weight:  189 lb 6.4 oz (85.9 kg)    Height:        Intake/Output Summary (Last 24 hours) at 11/27/16 0646 Last data filed at 11/27/16 0600  Gross per 24 hour  Intake              870 ml  Output             2120 ml  Net            -1250 ml   Filed Weights   11/25/16 0400 11/26/16 0400 11/27/16 0350  Weight: 190 lb 12.8 oz (86.5 kg) 187 lb 11.2 oz (85.1 kg) 189 lb 6.4 oz (85.9 kg)    Physical Exam  Well developed and nourished in no acute distress HENT normal Neck supple with JVP-flat Carotids brisk and  full without bruits Lungs:  CTA b/l CV - RRR, no murmurs or gallops, healing sternotomy incision, fresh PPM incision Abd-soft, notender, nondistended No Clubbing cyanosis asymmetric  edema R > L  Skin-warm and dry A & Oriented  Grossly normal sensory and motor function   Labs    CBC  Recent Labs  11/25/16 0358 11/26/16 0410  WBC 9.4 9.2  HGB 7.4* 7.4*  HCT 22.1* 22.5*  MCV 90.9 91.8  PLT 99* 527*   Basic Metabolic Panel  Recent Labs  11/25/16 0358 11/26/16 0410  NA 137 138  K 3.9 4.4  CL 105 105  CO2 27 26  GLUCOSE 107* 109*  BUN 18 20  CREATININE 1.02 1.05  CALCIUM 8.1* 8.2*     Telemetry    Telemetry Personally reviewed  P synchronous ventricular pacing    Radiology    CXR - no PTX, leads in good position   Patient Profile      Dakota Martin  Dakota Martin is a 76 y.o. male.  EP is consulted for complete heart block, s/p re-do sternotomy, AVR, thoracic ascending aneurysm repair, CABGx2  Today is POD #6 (thoracic surgery), POD #1 PPM  Assessment & Plan   1.  Complete heart block s/p AVR       S/p PPM yesterday with Dr. Caryl Comes      Normal device function, and CXR looks good  2.  S/p thoracic ascending aneurysm repair and AVR      POD #6      Per TCTS  3. CAD      s/p CABG     Stable with no chest pain.  4. Asymmetrical LE edema     [place] negative for DVT   Tommye Standard, PA-C  EP Attending  Patient seen and examined. Agree with above. He can move forward with dc planning. PPM function is stable. We will arrange PM wound f/u and f/u with me in clinic.  Mikle Bosworth.D.

## 2016-11-28 LAB — BASIC METABOLIC PANEL
ANION GAP: 6 (ref 5–15)
BUN: 21 mg/dL — ABNORMAL HIGH (ref 6–20)
CO2: 26 mmol/L (ref 22–32)
Calcium: 8.9 mg/dL (ref 8.9–10.3)
Chloride: 103 mmol/L (ref 101–111)
Creatinine, Ser: 1.13 mg/dL (ref 0.61–1.24)
GFR calc Af Amer: >60 mL/min (ref >60)
GLUCOSE: 116 mg/dL — AB (ref 65–99)
POTASSIUM: 4.8 mmol/L (ref 3.5–5.1)
Sodium: 135 mmol/L (ref 135–145)

## 2016-11-28 LAB — CBC
HCT: 24.7 % — ABNORMAL LOW (ref 39.0–52.0)
Hemoglobin: 8.1 g/dL — ABNORMAL LOW (ref 13.0–17.0)
MCH: 30.3 pg (ref 26.0–34.0)
MCHC: 32.8 g/dL (ref 30.0–36.0)
MCV: 92.5 fL (ref 78.0–100.0)
PLATELETS: 158 10*3/uL (ref 150–400)
RBC: 2.67 MIL/uL — AB (ref 4.22–5.81)
RDW: 15.6 % — ABNORMAL HIGH (ref 11.5–15.5)
WBC: 10.6 10*3/uL — AB (ref 4.0–10.5)

## 2016-11-28 LAB — PROTIME-INR
INR: 1.14
Prothrombin Time: 14.7 seconds (ref 11.4–15.2)

## 2016-11-28 MED ORDER — GUAIFENESIN ER 600 MG PO TB12
1200.0000 mg | ORAL_TABLET | Freq: Two times a day (BID) | ORAL | Status: DC
  Administered 2016-11-28 – 2016-11-30 (×5): 1200 mg via ORAL
  Filled 2016-11-28 (×5): qty 2

## 2016-11-28 MED ORDER — FUROSEMIDE 40 MG PO TABS
40.0000 mg | ORAL_TABLET | Freq: Every day | ORAL | Status: DC
  Administered 2016-11-29 – 2016-11-30 (×2): 40 mg via ORAL
  Filled 2016-11-28 (×2): qty 1

## 2016-11-28 NOTE — Discharge Summary (Signed)
[address]       [place] [address]             [phone]      Physician Discharge Summary  Patient ID: Dakota Martin MRN: [id number] DOB/AGE: [birth year] 76 y.o.  Admit date: 11/21/2016 Discharge date: 11/30/2016  Admission Diagnoses: Patient Active Problem List   Diagnosis Date Noted  . Ascending aortic aneurysm ([place]) 02/14/2016  . Lymphadenopathy   . Glenohumeral arthritis 07/28/2013  . Hyperlipidemia, unspecified 04/21/2009  . GOUT, UNSPECIFIED 04/21/2009  . Essential hypertension 04/21/2009  . 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 Hehad an aortic valve replacement with a Bjork Shiley valve in 1982. In 2015 he had a cardiac echocardiogram which showed the valve to have a mean gradient of 15 mmHg a peak gradient 31 mmHg. His ascending aorta was noted to be enlarged so a CT was done. 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 I have been following him every 6 months since then. I last saw him in May. At that time he was doing well but the aneurysm had grown from 5.1 toalmost 5.5 cm in the interim. We had a long discussion about possibly going ahead with aortic aneurysm repair. We also discussed replacing his [name] valve at the time of aneurysm repair.  He had an echocardiogram. He continues to feel well with no chest pain, pressure, tightness, or shortness of breath. He remains physically active and plays tennis frequently. His echocardiogram showed the valve was functioning well but his transvalvular gradients  had increased from his previous echo in 2015. Mean gradient 15 up to 17 mmHg, peak gradient went from 31-41 mmHg. His LVEF was 55-60%.  When I saw him in early June he was not yet ready to make a decision as to whether to proceed with surgery. He now decided to proceed and is also decided he would like his [facility] valve replaced with a tissue valve at the time of surgery.   Hospital Course:  On 11/22/2016 Dakota Martin underwent a redo aortic valve replacement with mechanical valve and biologic Bentall with a heme show graft, and a coronary bypass grafting 2 with Dr. Roxan Martin. He tolerated the procedure well and was transferred to the cardiac ICU. He was extubated in a timely manner. Postop day 1 we initiated a diuretic regimen for fluid overload. We monitored his thrombocytopenia and transfused as needed. We used SCDs for DVT prophylaxis. We continued his chest tubes due to output. Postop day 2 he remained in heart block with escape in the 40s, therefore we held beta-blockade. We consulted electrophysiology to see in case he would need a pacemaker. We checked a hit panel due to his continued thrombocytopenia. We continued to mobilize the patient as tolerated. Postop day 3 he remained in heart block, AV paced. We started Coumadin for anticoagulation. We planned for a permanent pacemaker to be placed on Monday, 11/26/2016. Postop day 4 he remained AV paced. We continued to wean his oxygen requirements as tolerated. We continued his diuretic regimen and his weight continued to trend down. Postop day 5 he continued to be in complete heart  block, therefore a PPM was placed.  He was on Coumadin but his INR had not increased. His platelet count continued to trend up. Postop day 6 he was stable to transfer to the telemetry unit for continued care. We were able to decrease his diuretic regimen at this time. We continued coumadin and monitoring of his INR. His renal function remained stable. We continued to  encourage ambulation and incentive spirometry. POD 7 he was ambulating with limited assistance, tolerating room air, his incisions were healing well and he was ready for discharge home.   Consults: electrophysiology  Significant Diagnostic Studies:   Treatments:  NAMESAINTCLAIR, SCHROADER             ACCOUNT NO.:  [id number]  MEDICAL RECORD NO.:  [id number]  LOCATION:  [place]                        FACILITY:  [facility]  PHYSICIAN:  Dakota Martin, M.D.DATE OF BIRTH:  October 06, 1940  DATE OF PROCEDURE:  11/21/2016 DATE OF DISCHARGE:                              OPERATIVE REPORT   PREOPERATIVE DIAGNOSIS:  Ascending aortic and root aneurysm and two- vessel coronary artery disease.  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  PROCEDURES:  Redo median sternotomy, redo aortic valve replacement with 23-mm [facility] Ease bovine pericardial valve, serial number [id number], model number [id number], biologic Bentall with 30-mm Hemashield graft, coronary artery bypass grafting x2 (saphenous vein graft to left anterior descending, saphenous vein graft to posterior descending), and endoscopic vein harvest, right thigh.  SURGEON:  Dakota Martin, M.D.  ASSISTANT:  Nicholes Rough, PA.  Disposition: 01-Home or Self Care   Discharge Instructions    Amb Referral to Cardiac Rehabilitation    Complete by:  As directed    Diagnosis:   CABG Valve Replacement     Valve:  Aortic Comment - Bentall   CABG X ___:  2     Allergies as of 11/30/2016   No Known Allergies     Medication List    STOP taking these medications   amLODipine 5 MG tablet Commonly known as:  NORVASC   enoxaparin 80 MG/0.8ML injection Commonly known as:  LOVENOX   ibuprofen 200 MG tablet Commonly known as:  ADVIL,MOTRIN   mupirocin nasal ointment 2 % Commonly known as:  BACTROBAN     TAKE these medications   acetaminophen 325 MG  tablet Commonly known as:  TYLENOL Take 1-2 tablets (325-650 mg total) by mouth every 4 (four) hours as needed for mild pain.   allopurinol 300 MG tablet Commonly known as:  ZYLOPRIM Take 300 mg by mouth daily at 12 noon.   amoxicillin 500 MG capsule Commonly known as:  AMOXIL Take 2,000 mg by mouth See admin instructions. Take 4 capsules (2000 mg) by mouth 1 hour prior to dental procedures.   aspirin 81 MG EC tablet Take 1 tablet (81 mg total) by mouth daily.   atorvastatin 10 MG tablet Commonly known as:  LIPITOR Take 10 mg by mouth daily at 12 noon.   carvedilol 6.25 MG tablet Commonly known as:  COREG TAKE 1 TABLET BY MOUTH TWO  TIMES DAILY What changed:  See the new instructions.   cholecalciferol 1000 units tablet Commonly known as:  VITAMIN D Take 1,000  Units by mouth daily with breakfast.   Coenzyme Q10 50 MG Caps Take 50 mg by mouth daily with breakfast.   ezetimibe 10 MG tablet Commonly known as:  ZETIA Take 5 mg by mouth daily. In the morning.   fish oil-omega-3 fatty acids 1000 MG capsule Take 1 g by mouth 2 (two) times daily.   furosemide 40 MG tablet Commonly known as:  LASIX Take 1 tablet (40 mg total) by mouth daily.   hydrochlorothiazide 12.5 MG capsule Commonly known as:  MICROZIDE TAKE 1 CAPSULE BY MOUTH  DAILY What changed:  See the new instructions.   Magnesium 500 MG Caps Take 500 mg by mouth every evening.   OVER THE COUNTER MEDICATION Take 1 capsule by mouth 2 (two) times daily. AYER-TRIPHALA   oxybutynin 5 MG tablet Commonly known as:  DITROPAN Take 1 tablet (5 mg total) by mouth every 8 (eight) hours as needed for bladder spasms.   potassium chloride 10 MEQ tablet Commonly known as:  K-DUR,KLOR-CON TAKE 1 TABLET BY MOUTH  DAILY What changed:  See the new instructions.   telmisartan 80 MG tablet Commonly known as:  MICARDIS Take 80 mg by mouth daily. In the morning.   traMADol 50 MG tablet Commonly known as:  ULTRAM Take 1-2  tablets (50-100 mg total) by mouth every 4 (four) hours as needed for moderate pain.   Vitamin B-12 5000 MCG Subl Place 5,000 mcg under the tongue 2 (two) times a week. Wednesday & Saturday.   warfarin 5 MG tablet Commonly known as:  COUMADIN Take 5 mg by mouth daily. Take 1.5 tablets (7.5 mg) by mouth on Monday, Wednesday, & Friday; Take 1 tablet (5 mg) by mouth on all other days of the week Saturday, Sunday, Tuesday, & Thursday.      Follow-up Information    [facility] Follow up on 12/12/2016.   Specialty:  Cardiology Why:  4:00PM, wound check (pacemaker) Contact information: [address], [address] [place]       Evans Lance, MD Follow up on 02/27/2017.   Specialty:  Cardiology Why:  9:30AM Contact information: [address] [address] [phone]        Crist Infante, MD. Call in 1 day(s).   Specialty:  Internal Medicine Contact information: [address] [place] [address] [phone]        Melrose Nakayama, MD Follow up.   Specialty:  Cardiothoracic Surgery Why:  Your appointment is on 12/25/2016 at 11:30am. Please arrive at 11:00am for a chest xray located at [facility] which is on the first floor of our building.  Contact information: [address] [address] [phone]          The patient has been discharged on:   1.Beta Blocker:  Yes [ x  ]                              No   [   ]                              If No, reason:  2.Ace Inhibitor/ARB: Yes [  x ]  No  [    ]                                     If No, reason:  3.Statin:   Yes [ x  ]                  No  [   ]                  If No, reason:  4.Ecasa:  Yes  [ x  ]                  No   [   ]                  If No, reason:   Signed: BARRETT, ERIN 11/30/2016, 8:30 AM

## 2016-11-28 NOTE — Progress Notes (Signed)
CARDIAC REHAB PHASE I   PRE:  Rate/Rhythm: 83 dual pacing    BP: sitting 109/57    SaO2: 92 RA  MODE:  Ambulation: 740 ft   POST:  Rate/Rhythm: 113 pacing    BP: sitting 121/62     SaO2: 95 RA  Pt able to stand with rocking and walk with RW. Steady. He was dual pacing with fluctuating rate before and during walk. After returning to recliner pt began V pacing solely. Increased distance, feels well. Practiced IS, 1200 mL with significant "tickle" cough at end of each. In recliner with feet elevated. Encouraged x2 more walks with family or staff.  [phone]   [place], ACSM 11/28/2016 11:26 AM

## 2016-11-28 NOTE — Progress Notes (Signed)
Pt walked 470 ft this evening. No complaints. Vitals stable.

## 2016-11-28 NOTE — Progress Notes (Signed)
Patient given written after care instructions regarding pacemaker, aortic valve replacement, and coumadin. Patient states he will review with wife and let RN know if they wish to speak with MD for any additional questions.

## 2016-11-28 NOTE — Plan of Care (Signed)
Problem: Physical Regulation: Goal: Ability to maintain clinical measurements within normal limits will improve Outcome: Progressing Patient VSS during rest and ambulation at this time

## 2016-11-28 NOTE — Progress Notes (Signed)
      [address]       [place] [address]             [phone]      2 Days Post-Op Procedure(s) (LRB): Pacemaker Implant (N/A) Subjective: No issues. Feels good this morning.   Objective: Vital signs in last 24 hours: Temp:  [98.3 F (36.8 C)-98.8 F (37.1 C)] 98.8 F (37.1 C) (08/01 0422) Pulse Rate:  [80-98] 82 (08/01 0422) Cardiac Rhythm: Ventricular paced (08/01 0700) Resp:  [11-26] 19 (08/01 0422) BP: (117-168)/(54-78) 129/62 (08/01 0422) SpO2:  [93 %-100 %] 97 % (08/01 0422) Weight:  [84.8 kg (186 lb 14.4 oz)] 84.8 kg (186 lb 14.4 oz) (08/01 0422)     Intake/Output from previous day: 07/31 0701 - 08/01 0700 In: 780 [P.O.:780] Out: 4940 [Urine:4940] Intake/Output this shift: No intake/output data recorded.  General appearance: alert, cooperative and no distress Heart: paced, NSR Lungs: clear to auscultation bilaterally Abdomen: soft, non-tender; bowel sounds normal; no masses,  no organomegaly Extremities: extremities normal, atraumatic, no cyanosis or edema Wound: clean and dry  Lab Results:  Recent Labs  11/26/16 0410 11/28/16 0452  WBC 9.2 10.6*  HGB 7.4* 8.1*  HCT 22.5* 24.7*  PLT 105* 158   BMET:  Recent Labs  11/26/16 0410 11/28/16 0452  NA 138 135  K 4.4 4.8  CL 105 103  CO2 26 26  GLUCOSE 109* 116*  BUN 20 21*  CREATININE 1.05 1.13  CALCIUM 8.2* 8.9    PT/INR:  Recent Labs  11/28/16 0452  LABPROT 14.7  INR 1.14   ABG    Component Value Date/Time   PHART 7.422 11/22/2016 0222   HCO3 22.8 11/22/2016 0222   TCO2 22 11/22/2016 1540   ACIDBASEDEF 1.0 11/22/2016 0222   O2SAT 94.0 11/22/2016 0222   CBG (last 3)   Recent Labs  11/26/16 1607 11/26/16 2133 11/27/16 0809  GLUCAP 115* 120* 97    Assessment/Plan: S/P Procedure(s) (LRB): Pacemaker Implant (N/A)  1. CV-PPM, rate 70s NSR. BP well controlled. On Coreg, Zetia, Microzide, Avapro, and Lipitor.  2. Pulm-tolerating room air with good  oxygenation. Encourage incentive spirometry 3. Renal-creatinine 1.13. Electrolytes okay. Good urine output with weight trending down.  4. H and H stable, expected acute blood loss anemia 5. Endo-blood glucose well controlled on his current regimen. 6. Anticoagulation-home dose of coumadin resumed. INR 1.14   Plan: Ambulation today. Continues to progress. Encourage use of incentive spirometer. Continue coumadin and monitoring INR. Possibly home in the next 1-2 days.    LOS: 7 days    Elgie Collard 11/28/2016

## 2016-11-28 NOTE — Care Management Important Message (Signed)
Important Message  Patient Details  Name: Dakota Martin MRN: [id number] Date of Birth: 02/26/1941   Medicare Important Message Given:  Yes    Nathen May 11/28/2016, 12:28 PM

## 2016-11-29 LAB — PROTIME-INR
INR: 1.2
PROTHROMBIN TIME: 15.3 s — AB (ref 11.4–15.2)

## 2016-11-29 MED ORDER — WARFARIN SODIUM 2.5 MG PO TABS
2.5000 mg | ORAL_TABLET | Freq: Once | ORAL | Status: AC
  Administered 2016-11-29: 2.5 mg via ORAL
  Filled 2016-11-29: qty 1

## 2016-11-29 NOTE — Progress Notes (Addendum)
      [address]       ,[place] [address]             [phone]      3 Days Post-Op Procedure(s) (LRB): Pacemaker Implant (N/A) Subjective: Feels good this morning. Had a bowel movement.   Objective: Vital signs in last 24 hours: Temp:  [98.1 F (36.7 C)-99.9 F (37.7 C)] 98.1 F (36.7 C) (08/02 0607) Pulse Rate:  [74-90] 74 (08/02 0607) Cardiac Rhythm: Ventricular paced (08/02 0700) Resp:  [19-22] 21 (08/02 0607) BP: (96-134)/(48-61) 116/61 (08/02 0607) SpO2:  [93 %-95 %] 93 % (08/01 1206) Weight:  [82.2 kg (181 lb 4.8 oz)] 82.2 kg (181 lb 4.8 oz) (08/02 0322)     Intake/Output from previous day: 08/01 0701 - 08/02 0700 In: 720 [P.O.:720] Out: 1775 [Urine:1775] Intake/Output this shift: No intake/output data recorded.  General appearance: alert, cooperative and no distress Heart: regular rate and rhythm, S1, S2 normal, no murmur, click, rub or gallop Lungs: clear to auscultation bilaterally Abdomen: soft, non-tender; bowel sounds normal; no masses,  no organomegaly Extremities: 1+ pitting edema R > L Wound: clean and dry EVH site and sternal incision  Lab Results:  Recent Labs  11/28/16 0452  WBC 10.6*  HGB 8.1*  HCT 24.7*  PLT 158   BMET:  Recent Labs  11/28/16 0452  NA 135  K 4.8  CL 103  CO2 26  GLUCOSE 116*  BUN 21*  CREATININE 1.13  CALCIUM 8.9    PT/INR:  Recent Labs  11/29/16 0218  LABPROT 15.3*  INR 1.20   ABG    Component Value Date/Time   PHART 7.422 11/22/2016 0222   HCO3 22.8 11/22/2016 0222   TCO2 22 11/22/2016 1540   ACIDBASEDEF 1.0 11/22/2016 0222   O2SAT 94.0 11/22/2016 0222   CBG (last 3)   Recent Labs  11/26/16 1607 11/26/16 2133 11/27/16 0809  GLUCAP 115* 120* 97    Assessment/Plan: S/P Procedure(s) (LRB): Pacemaker Implant (N/A)   1. CV-PPM, rate 70s NSR. BP well controlled. On Coreg, Zetia, Microzide, Avapro, and Lipitor.  2. Pulm-tolerating room air with good oxygenation.  Encourage incentive spirometry 3. Renal-creatinine 1.13. Electrolytes okay. Good urine output with weight trending down. On daily Lasix. Remains about 10 lbs fluid overloaded.   4. H and H stable, expected acute blood loss anemia 5. Endo-blood glucose well controlled on his current regimen. 6. Anticoagulation-home dose of coumadin resumed. INR 1.20  Plan: Likely home tomorrow morning if INR bumps and he continued to diurese (remains about 10 lbs fluid overloaded). Encouraged ambulation and incentive spirometry use.    LOS: 8 days    Elgie Collard 11/29/2016 Patient seen and examined, agree with above will give extra 2.5 mg of coumadin today Home tomorrow regardless of INR unless issues arise overnight  Remo Lipps C. Roxan Hockey, MD [facility] [phone]

## 2016-11-29 NOTE — Progress Notes (Signed)
CARDIAC REHAB PHASE I   PRE:  Rate/Rhythm v paced 80  BP:  Supine:   Sitting: 116/83  Standing:    SaO2: 95%RA  MODE:  Ambulation: 810 ft   POST:  Rate/Rhythm: 106 v paced  BP:  Supine:   Sitting: 156/48  Standing:    SaO2: 98%RA 1015-1050 Pt walked 810 ft on RA with rolling walker independently with steady gait. Pt does not think he will need walker for home. Tolerated well. Second walk for today. Discussed CRP 2 and referring to [place].   Graylon Good, RN BSN  11/29/2016 10:48 AM

## 2016-11-29 NOTE — Progress Notes (Signed)
Per Johann Capers CVTS PA pt and wife with questions regarding restrictions after PPM placement. Pt given PPM aftercare exit care handout. Pt and wife counseled on weight/LUE movement restrictions. Educated on Engineer, materials r/t airport security, cell phone use, etc.   Fritz Pickerel, RN

## 2016-11-30 LAB — PROTIME-INR
INR: 1.31
PROTHROMBIN TIME: 16.4 s — AB (ref 11.4–15.2)

## 2016-11-30 MED ORDER — OXYBUTYNIN CHLORIDE 5 MG PO TABS: 5.0000 mg | ORAL_TABLET | Freq: Three times a day (TID) | ORAL | 0 refills | Status: DC | PRN

## 2016-11-30 MED ORDER — TRAMADOL HCL 50 MG PO TABS: 50.0000 mg | ORAL_TABLET | ORAL | 0 refills | Status: DC | PRN

## 2016-11-30 MED ORDER — ASPIRIN 81 MG PO TBEC: 81.0000 mg | DELAYED_RELEASE_TABLET | Freq: Every day | ORAL | Status: DC

## 2016-11-30 MED ORDER — FUROSEMIDE 40 MG PO TABS: 40.0000 mg | ORAL_TABLET | Freq: Every day | ORAL | 0 refills | Status: DC

## 2016-11-30 MED ORDER — ACETAMINOPHEN 325 MG PO TABS: 325.0000 mg | ORAL_TABLET | ORAL | Status: DC | PRN

## 2016-11-30 NOTE — Progress Notes (Signed)
      [address]       [place] [address]             [phone]      4 Days Post-Op Procedure(s) (LRB): Pacemaker Implant (N/A)   Subjective:  Mr. Draheim has no complaints.  He feels great and is ready to go home.  They do need some instruction on the bedside box for his PPM, EP has been consulted.  Objective: Vital signs in last 24 hours: Temp:  [98 F (36.7 C)-99.2 F (37.3 C)] 98.7 F (37.1 C) (08/03 0505) Cardiac Rhythm: Ventricular paced (08/02 1900) Resp:  [19-21] 21 (08/02 1205) BP: (97-115)/(54-58) 112/58 (08/03 0505) SpO2:  [96 %] 96 % (08/02 1205) Weight:  [170 lb 11.2 oz (77.4 kg)] 170 lb 11.2 oz (77.4 kg) (08/03 0556)  Intake/Output from previous day: 08/02 0701 - 08/03 0700 In: -  Out: 3055 [Urine:3055]  General appearance: alert, cooperative and no distress Heart: regular rate and rhythm and paced Lungs: clear to auscultation bilaterally Abdomen: soft, non-tender; bowel sounds normal; no masses,  no organomegaly Extremities: edema 2+ Wound: clean and dry  Lab Results:  Recent Labs  11/28/16 0452  WBC 10.6*  HGB 8.1*  HCT 24.7*  PLT 158   BMET:  Recent Labs  11/28/16 0452  NA 135  K 4.8  CL 103  CO2 26  GLUCOSE 116*  BUN 21*  CREATININE 1.13  CALCIUM 8.9    PT/INR:  Recent Labs  11/30/16 0239  LABPROT 16.4*  INR 1.31   ABG    Component Value Date/Time   PHART 7.422 11/22/2016 0222   HCO3 22.8 11/22/2016 0222   TCO2 22 11/22/2016 1540   ACIDBASEDEF 1.0 11/22/2016 0222   O2SAT 94.0 11/22/2016 0222   CBG (last 3)  No results for input(s): GLUCAP in the last 72 hours.  Assessment/Plan: S/P Procedure(s) (LRB): Pacemaker Implant (N/A)  1. CV- S/P PPM, BP remains well controlled- continue Coreg, Zetia, Avapro, Lipitor 2. INR 1.31, continue patient's home regimen of coumadin, he has set up INR draw for Monday 3. Pulm- no acute issues, off oxygen 4. Renal-creatinine has been stable, remains edematous on  exam, with pitting edema, continue Lasix, HCTZ 5. CBGs- controlled, patient not a diabetic, will d/c SSIP 6. Dispo- patient stable, will d/c home today   LOS: 9 days    BARRETT, ERIN 11/30/2016

## 2016-11-30 NOTE — Care Management Important Message (Signed)
Important Message  Patient Details  Name: Dakota Martin MRN: [id number] Date of Birth: 01-18-1941   Medicare Important Message Given:  Yes    Berlin Mokry Abena 11/30/2016, 10:27 AM

## 2016-11-30 NOTE — Progress Notes (Signed)
0930-1000 Education completed with pt and wife who voiced understanding.  Encouraged IS and walking. Reviewed sternal precautions. Gave heart healthy diet and low sodium handouts. Offered discharge video but pt declined as he stated he has had valve surgery before and ready to go home. Referring to [facility] Phase 2. Graylon Good RN BSN 11/30/2016 10:00 AM

## 2016-11-30 NOTE — Care Management Note (Signed)
Case Management Note Previous CM note initiated by Zenon Mayo, RN-11/21/2016, 4:30 PM    Patient Details  Name: Dakota Martin MRN: [id number] Date of Birth: 10-Jun-1940  Subjective/Objective:   From home with wife, surgery today for AVR, AAA Repair, CABG.                  Action/Plan: NCM will follow for dc needs.   Expected Discharge Date:  11/30/16               Expected Discharge Plan:  Home/Self Care  In-House Referral:  NA  Discharge planning Services  CM Consult  Post Acute Care Choice:  NA Choice offered to:  NA  DME Arranged:    DME Agency:     HH Arranged:    HH Agency:     Status of Service:  Completed, signed off  If discussed at [employer] of Stay Meetings, dates discussed:    Discharge Disposition: home/self care  Additional Comments:  11/30/16- 1000- Kerah Hardebeck RN, CM- pt for d/c home today- no CM needs noted for discharge.   Dahlia Client [place], RN 11/30/2016, 12:12 PM [phone]

## 2016-11-30 NOTE — Progress Notes (Signed)
Patient discharge instructions reviewed with patient and wife, questions answered,    IV site NSL  Previously discontinued  And Telemetry discontinued.  Mervyn Skeeters, RN

## 2016-11-30 NOTE — Progress Notes (Signed)
Sutures removed from chest area x 2, painted with betadine.    IV site NSL  Removed from Right upper arm, tolerated well.  Angelina Sheriff, RN

## 2016-12-05 ENCOUNTER — Telehealth (HOSPITAL_COMMUNITY): Payer: Self-pay

## 2016-12-05 NOTE — Telephone Encounter (Signed)
Patient insurance is active and benefits verified. Patient has [facility] Medicare - $20.00 co-payment, no deductible, out of pocket $4400/$379.25 has been met, no co-insurance, no pre-authorization and no limit on visit. Passport/reference [phone].

## 2016-12-12 ENCOUNTER — Ambulatory Visit (INDEPENDENT_AMBULATORY_CARE_PROVIDER_SITE_OTHER): Payer: Medicare Other | Admitting: *Deleted

## 2016-12-12 DIAGNOSIS — I442 Atrioventricular block, complete: Secondary | ICD-10-CM | POA: Diagnosis not present

## 2016-12-12 DIAGNOSIS — Z950 Presence of cardiac pacemaker: Secondary | ICD-10-CM

## 2016-12-12 LAB — CUP PACEART INCLINIC DEVICE CHECK
Battery Remaining Longevity: 141 mo
Brady Statistic AP VS Percent: 0 %
Brady Statistic AS VS Percent: 0.19 %
Implantable Lead Implant Date: 20180730
Implantable Lead Location: 753859
Implantable Lead Model: 5076
Lead Channel Impedance Value: 456 Ohm
Lead Channel Impedance Value: 551 Ohm
Lead Channel Pacing Threshold Amplitude: 0.5 V
Lead Channel Pacing Threshold Amplitude: 0.75 V
Lead Channel Pacing Threshold Pulse Width: 0.4 ms
Lead Channel Setting Pacing Amplitude: 3.5 V
Lead Channel Setting Pacing Amplitude: 3.5 V
Lead Channel Setting Pacing Pulse Width: 0.4 ms
Lead Channel Setting Sensing Sensitivity: 4 mV
MDC IDC LEAD IMPLANT DT: 20180730
MDC IDC LEAD LOCATION: 753860
MDC IDC MSMT BATTERY VOLTAGE: 3.18 V
MDC IDC MSMT LEADCHNL RA IMPEDANCE VALUE: 285 Ohm
MDC IDC MSMT LEADCHNL RA PACING THRESHOLD PULSEWIDTH: 0.4 ms
MDC IDC MSMT LEADCHNL RA SENSING INTR AMPL: 1 mV
MDC IDC MSMT LEADCHNL RV IMPEDANCE VALUE: 570 Ohm
MDC IDC PG IMPLANT DT: 20180730
MDC IDC SESS DTM: 20180815180801
MDC IDC STAT BRADY AP VP PERCENT: 0.24 %
MDC IDC STAT BRADY AS VP PERCENT: 99.57 %
MDC IDC STAT BRADY RA PERCENT PACED: 0.27 %
MDC IDC STAT BRADY RV PERCENT PACED: 99.81 %

## 2016-12-12 NOTE — Progress Notes (Signed)
Wound check appointment. Dermabond removed. Wound without redness or edema. Incision edges superficially unapproximated along L incision corner. No drainage or stitch noted, unapproximated edges reapproximated with Steri-strips. Incision edges otherwise approximated and well healed. Patient to keep site clean and dry until wound recheck. Patient agrees to call the [facility] if he notes any signs/symptoms of infection in the interim.  Normal device function. Thresholds, sensing, and impedances consistent with implant measurements. Device programmed at 3.5V with auto capture programmed on for extra safety margin until 3 month visit. Histogram distribution appropriate for patient and level of activity. No mode switches or high ventricular rates noted. Patient educated about wound care, arm mobility, lifting restrictions, and Carelink monitor. ROV with [facility] on 12/18/16 for wound recheck and ROV with [facility] on 02/27/17.

## 2016-12-13 ENCOUNTER — Telehealth: Payer: Self-pay | Admitting: *Deleted

## 2016-12-13 NOTE — Telephone Encounter (Signed)
Patient called to update his Carelink monitor SN.  SN entered, monitor is updating automatically.  Patient verbalizes understanding.  Patient is unsure if he should still be taking furosemide.   It was started while he was in the hospital and he was discharged on it, but no refills ordered.  Patient reports some chest congestion that makes him ShOB, but states this is ongoing since his procedure and is not improved or changed by taking furosemide.  His weight is down ~6lbs since before hospitalization.  R leg still swells intermittently, but he feels this is related to the vein harvest that was done.  No edema noted elsewhere, no chest discomfort.  Advised patient that I will forward message to Dr. Lovena Le for further recommendations.  Patient is appreciative and denies additional questions or concerns at this time.

## 2016-12-18 ENCOUNTER — Ambulatory Visit (INDEPENDENT_AMBULATORY_CARE_PROVIDER_SITE_OTHER): Payer: Self-pay | Admitting: *Deleted

## 2016-12-18 DIAGNOSIS — I442 Atrioventricular block, complete: Secondary | ICD-10-CM

## 2016-12-18 DIAGNOSIS — Z950 Presence of cardiac pacemaker: Secondary | ICD-10-CM

## 2016-12-18 NOTE — Patient Instructions (Addendum)
Wash site twice daily with antibacterial soap and water.  Call the [facility] at [phone] if you note any signs/symptoms of infection, including drainage, redness, swelling, fever, or chills.

## 2016-12-18 NOTE — Progress Notes (Signed)
Wound recheck appointment.  Steri-strips removed from L incision corner.  Area of superficial un-approximation is smaller.  No drainage or stitch noted.  Incision otherwise well healed.  Patient denies any signs/symptoms of infection, including fever/chills.  Dr. Lovena Le saw patient, recommended that he wash site with antibacterial soap and water twice daily, then pat dry.  If not fully healed within the next two weeks, or if any signs/symptoms of infection evident, patient to call back.  Patient agreeable to plan and denies additional questions or concerns at this time.  Per Dr. Lovena Le, additional furosemide seems unnecessary at this time.  Patient to follow-up with Dr. Roxan Hockey on 12/25/16 as scheduled.

## 2016-12-18 NOTE — Telephone Encounter (Signed)
See encounter from 12/18/16.

## 2016-12-24 ENCOUNTER — Other Ambulatory Visit: Payer: Self-pay | Admitting: Thoracic Surgery (Cardiothoracic Vascular Surgery)

## 2016-12-24 DIAGNOSIS — Z951 Presence of aortocoronary bypass graft: Secondary | ICD-10-CM

## 2016-12-25 ENCOUNTER — Ambulatory Visit (INDEPENDENT_AMBULATORY_CARE_PROVIDER_SITE_OTHER): Payer: Self-pay | Admitting: Thoracic Surgery (Cardiothoracic Vascular Surgery)

## 2016-12-25 ENCOUNTER — Ambulatory Visit
Admission: RE | Admit: 2016-12-25 | Discharge: 2016-12-25 | Disposition: A | Discharge status: Home / Self Care | Payer: Medicare Other | Source: Ambulatory Visit | Attending: Thoracic Surgery (Cardiothoracic Vascular Surgery) | Admitting: Thoracic Surgery (Cardiothoracic Vascular Surgery)

## 2016-12-25 ENCOUNTER — Encounter: Payer: Self-pay | Admitting: Thoracic Surgery (Cardiothoracic Vascular Surgery)

## 2016-12-25 VITALS — BP 110/68 | HR 83 | Resp 16 | Ht 69.0 in | Wt 165.0 lb

## 2016-12-25 DIAGNOSIS — Z952 Presence of prosthetic heart valve: Secondary | ICD-10-CM

## 2016-12-25 DIAGNOSIS — Z951 Presence of aortocoronary bypass graft: Secondary | ICD-10-CM

## 2016-12-25 DIAGNOSIS — I712 Thoracic aortic aneurysm, without rupture: Secondary | ICD-10-CM

## 2016-12-25 DIAGNOSIS — I7121 Aneurysm of the ascending aorta, without rupture: Secondary | ICD-10-CM

## 2016-12-25 NOTE — Progress Notes (Signed)
[address]       [place] [address]             [phone]     HPI: Dakota Martin returns for a scheduled postoperative follow-up visit  He is a 76 year old man with a past history of hypertension, hyperlipidemia, arthritis, gout, and an aVR for aortic stenosis in 1982. He developed an ascending aneurysm. In May his aneurysm had increased in size from 5.1 to 5.5 cm over a six-month period. At catheterization he was found to have two-vessel coronary disease. He underwent redo sternotomy, redo AVR with a bovine pericardial valve, repair of an ascending aneurysm, and coronary bypass grafting 2 on 11/21/2016. 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 Lovena Le. He went home on 11/30/2016.  He complains of some swelling in his right leg. He also complains of feeling sensation in the fifth fingers on both the right and left hands. He feels a little depressed. He has had some constipation. He has an occasional cough and some shortness of breath with exertion.  Past Medical History:  Diagnosis Date  . Aortic valve disease   . Arthritis   . Ascending aortic aneurysm ([place]) 02/14/2016   5 cm in 2015 // Chest CT 08/28/16:  Ascending thoracic aortic aneurysm 5.5 cm, Thoracic and abdominal aortic atherosclerosis, Remote granulomatous disease  . Colon polyp   . Gout   . Heart murmur    as a younger person,   . Hyperlipidemia   . Hypertension   . 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 S/P aortic valve replacement with metallic valve 32/44/0102   Due to congenital aortic stenosis // [name] mechanical valve done in 1982 // Echo 09/10/16: Mild focal basal septal hypertrophy, [name], [name], [name], mod aortic root enlargement, aortic root 36 mm, ascending aorta 49 mm, [name], [name]    Current Outpatient Prescriptions    Medication Sig Dispense Refill  . acetaminophen (TYLENOL) 325 MG tablet Take 1-2 tablets (325-650 mg total) by mouth every 4 (four) hours as needed for mild pain.    [name] Kitchen allopurinol (ZYLOPRIM) 300 MG tablet Take 300 mg by mouth daily at 12 noon.     [name] Kitchen amoxicillin (AMOXIL) 500 MG capsule Take 2,000 mg by mouth See admin instructions. Take 4 capsules (2000 mg) by mouth 1 hour prior to dental procedures.    [name] Kitchen aspirin EC 81 MG EC tablet Take 1 tablet (81 mg total) by mouth daily.    [name] Kitchen atorvastatin (LIPITOR) 10 MG tablet Take 10 mg by mouth daily at 12 noon.     . carvedilol (COREG) 6.25 MG tablet TAKE 1 TABLET BY MOUTH TWO  TIMES DAILY (Patient taking differently: TAKE 1 TABLET (6.25 MG) BY MOUTH TWO  TIMES DAILY WITH BREAKFAST & WITH SUPPER) 180 tablet 3  . cholecalciferol (VITAMIN D) 1000 units tablet Take 1,000 Units by mouth daily with breakfast.    . Coenzyme Q10 50 MG CAPS Take 50 mg by mouth daily with breakfast.    . Cyanocobalamin (VITAMIN B-12) 5000 MCG SUBL Place 5,000 mcg under the tongue 2 (two) times a week. Wednesday & Saturday.    . ezetimibe (ZETIA) 10 MG tablet Take 5 mg by mouth daily. In the morning.    . fish oil-omega-3 fatty acids 1000 MG capsule Take 1  g by mouth 2 (two) times daily.     . furosemide (LASIX) 40 MG tablet Take 1 tablet (40 mg total) by mouth daily. 14 tablet 0  . hydrochlorothiazide (MICROZIDE) 12.5 MG capsule TAKE 1 CAPSULE BY MOUTH  DAILY (Patient taking differently: TAKE 1 CAPSULE (12.5 MG) BY MOUTH  DAILY WITH BREAKFAST) 90 capsule 3  . Magnesium 500 MG CAPS Take 500 mg by mouth every evening.     [name] Kitchen OVER THE COUNTER MEDICATION Take 1 capsule by mouth 2 (two) times daily. AYER-TRIPHALA     . oxybutynin (DITROPAN) 5 MG tablet Take 1 tablet (5 mg total) by mouth every 8 (eight) hours as needed for bladder spasms. 90 tablet 0  . potassium chloride (K-DUR,KLOR-CON) 10 MEQ tablet TAKE 1 TABLET BY MOUTH  DAILY (Patient taking differently: TAKE 1 TABLET (10 MEQ) BY  MOUTH  DAILY WITH BREAKFAST) 90 tablet 3  . telmisartan (MICARDIS) 80 MG tablet Take 80 mg by mouth daily. In the morning.  11  . traMADol (ULTRAM) 50 MG tablet Take 1-2 tablets (50-100 mg total) by mouth every 4 (four) hours as needed for moderate pain. 30 tablet 0  . warfarin (COUMADIN) 5 MG tablet Take 5 mg by mouth daily. Take 1.5 tablets (7.5 mg) by mouth on Monday, Wednesday, & Friday; Take 1 tablet (5 mg) by mouth on all other days of the week Saturday, Sunday, Tuesday, & Thursday.     No current facility-administered medications for this visit.     Physical Exam BP 110/68 (BP Location: Right Arm, Patient Position: Sitting, Cuff Size: Large)   Pulse 83   Resp 16   Ht 5' 9"  (1.753 m)   Wt 165 lb (74.8 kg)   SpO2 96% Comment: RA  BMI 24.42 kg/m  76 year old man in no acute distress Alert and oriented 3 with no focal deficits Lungs clear with equal breath sounds bilaterally Cardiac regular rate and rhythm normal S1 and S2 no rubs or murmurs Sternal incision clean dry and intact, sternum stable Right leg with 2+ pitting edema, trace edema left leg  Diagnostic Tests: CHEST  2 VIEW  COMPARISON:  Two-view chest x-ray 11/27/2016  FINDINGS: The heart is enlarged. Atherosclerotic calcifications are present in the aorta. Median sternotomy CABG and aortic valve replacement is noted. Bilateral pleural effusions are decreasing. There is mild residual dependent atelectasis associated. The upper lung fields are clear. Pacing wires are stable.  IMPRESSION: 1. Decreasing on bilateral pleural effusions with minimal residual atelectasis. 2. Postoperative changes are stable. 3. No other acute cardiopulmonary disease.   Electronically Signed   By: San Morelle M.D.   On: 12/25/2016 11:14 I reviewed the chest x-ray and concur with the findings noted above  Impression: Dakota Martin is a 76 year old gentleman who had redo AVR, repair of an ascending aneurysm, and  coronary bypass grafting 2 about a month ago. He did have complete heart block postoperatively and required a permanent pacemaker placement prior to discharge.  Overall at this point I then he's doing extremely well, considering the magnitude of the operation. Most of the complaints he has are common in the early postoperative period and should improve dramatically over the next 3-4 weeks.  He does have some swelling in the right leg where the vein was harvested. He has some trace swelling in the left leg. His blood pressure is been on the low side some reluctant to add Lasix this point. I did caution him to watch his salt intake.  Constipation-  he has been using Colace for this. That does have sodium minute. I recommended he try something along lines of Metamucil or Miralax to see if that would help without as much additional salt.  He is not having much pain.  I do think he is ready to start cardiac rehabilitation.  He should not lift anything over 10 pounds for another 2 weeks or anything over 20 pounds for another 4 weeks. After that his activities are restricted  Plan:  Return in 4 weeks. If no rhythm issues at that point we should be able to stop his Coumadin. Melrose Nakayama, MD [facility] [phone]

## 2016-12-26 ENCOUNTER — Telehealth (HOSPITAL_COMMUNITY): Payer: Self-pay | Admitting: *Deleted

## 2016-12-26 NOTE — Telephone Encounter (Signed)
-----   Message from Thayer Headings, MD sent at 12/26/2016  9:46 AM EDT ----- Regarding: RE: Follow up We will schedule him for a follow up   ----- Message ----- From: Rowe Pavy, RN Sent: 12/25/2016   7:50 PM To: Thayer Headings, MD Subject: Follow up                                      Dr. Acie Fredrickson,   The above pt was referred to cardiac rehab s/p 7/25 Redo AVR, CABG x 2 and repair of aneurysm. Pt had PPM placed on 7/30 for complete heart block by Dr. Lovena Le. Discharged on 8/3.  It does not appear pt has been seen in the cardiology office in follow up.  Pt seen in the surgeon office on 8/28 and will have a pacer check in October. Looks like he was seen in May of this year by [employer] who recommended follow up with you in 6 months (nothing is scheduled).  We typically like for our patients who are coming to cardiac rehab be seen in the cardiologist office due to the ongoing relationship after the surgeon signs off.  If you feel pt should be seen, please forward for scheduling. If you feel pt can proceed with cardiac rehab please indicate.   Thank you for your help Maurice Small RN, BSN Cardiac and Pulmonary Rehab Nurse Navigator

## 2016-12-26 NOTE — Addendum Note (Signed)
Addended by: Fanny Bien R on: 12/26/2016 01:22 PM   Modules accepted: Orders

## 2016-12-27 ENCOUNTER — Other Ambulatory Visit: Payer: Self-pay

## 2016-12-27 DIAGNOSIS — R609 Edema, unspecified: Secondary | ICD-10-CM

## 2016-12-27 MED ORDER — FUROSEMIDE 40 MG PO TABS: 40.0000 mg | ORAL_TABLET | Freq: Every day | ORAL | 0 refills | Status: DC

## 2017-01-01 ENCOUNTER — Ambulatory Visit (INDEPENDENT_AMBULATORY_CARE_PROVIDER_SITE_OTHER): Payer: Medicare Other | Admitting: Cardiovascular Disease

## 2017-01-01 ENCOUNTER — Ambulatory Visit: Payer: Medicare Other | Admitting: Cardiovascular Disease

## 2017-01-01 ENCOUNTER — Encounter: Payer: Self-pay | Admitting: Cardiovascular Disease

## 2017-01-01 VITALS — BP 110/54 | HR 93 | Ht 69.0 in | Wt 166.8 lb

## 2017-01-01 DIAGNOSIS — Z951 Presence of aortocoronary bypass graft: Secondary | ICD-10-CM | POA: Diagnosis not present

## 2017-01-01 DIAGNOSIS — Z952 Presence of prosthetic heart valve: Secondary | ICD-10-CM

## 2017-01-01 DIAGNOSIS — E782 Mixed hyperlipidemia: Secondary | ICD-10-CM | POA: Diagnosis not present

## 2017-01-01 HISTORY — DX: Presence of aortocoronary bypass graft: Z95.1

## 2017-01-01 NOTE — Patient Instructions (Signed)

## 2017-01-01 NOTE — Progress Notes (Signed)
Cardiology Office Note   Date:  01/01/2017   ID:  [name], DOB [birth year], MRN [id number]  PCP:  Crist Infante, MD  Cardiologist:   Mertie Moores, MD   Chief Complaint  Patient presents with  . Follow-up    Essential Hypertension      August 17, 2014:  Dakota Martin is a 76 y.o. male who presents for follow-up of his essential hypertension and mild aortic root dilatation. We had HCTZ and Kdur at his last visit.  BP readings have been well controlled.    Expand All Collapse All     Problem List: 1. 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   2. Chronic coumadin therapy.  3. Hypertension 4. Hyperlipidemia 5. Moderate dilation of the ascending aorta ( 50 mm)   History of Present Illness:  Dakota Martin  is a 76 yo with hx of AVR in 1982. He has been doing well. No CP , no dyspnea.  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   Oct. 27, 2015:  Dakota Martin is seen back for a scheduled visit. He had a recent echo that revealed a dilated aortic root ( 50 cm)     Jan. 19, 2016:  Dakota Martin  is seen back today for follow-up of his aortic valve replacement and history of hypertension. He has a dilated aortic root. Also has a history of hypertension and hyperlipidemia. He has been recording his BP daily. He has substituted generic Micardis ( Telmisartan)  for his Benicar  August 17, 2014:    Nov. 4, 2016:  Dakota Martin is seen back today for follow-up visit for his aortic valve replacement and aortic aneurysm. Doing great.  Feeling well.  Has seen Dr. Roxan Hockey - the ascending aortic aneurism seems stable .  Playing tennis regularly  - plays 2-3 times a week .  Going for a right neck lymph node bx.  Bridging with Lovenox.  ( Managed by Medical Resource Manager )   Sep 14, 2015:  Doing well. Playing tennis . Lots of yard work . Had a lymph node bx last  Nov.   Was benign.   Was cystic..  Drained it but it has returned.   Sept. 4, 2018:  Dakota Martin is doing great after his asc. Aortic aneurism repair / AVR ( bioprosthetic ) / CABG  Had a pacer placed later in the hospitalization.  Will follow up with Dr. Lovena Le  Overall doing well.   His appetite has not fully recovered.  Has minimal chest wall tenderness.   The plan is to stop his coumadin for another month - INR levels are managed by MRM at [facility] office.  He asked about the statin    Past Medical History:  Diagnosis Date  . Aortic valve disease   . Arthritis   . Ascending aortic aneurysm ([place]) 02/14/2016   5 cm in 2015 // Chest CT 08/28/16:  Ascending thoracic aortic aneurysm 5.5 cm, Thoracic and abdominal aortic atherosclerosis, Remote granulomatous disease  . Colon polyp   . Gout   . Heart murmur    as a younger person,   . Hyperlipidemia   . Hypertension   . 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 S/P aortic valve replacement with metallic valve 89/21/1941   Due to congenital aortic stenosis // [name]  Shiley mechanical valve done in 1982 // Echo 09/10/16: Mild focal basal septal hypertrophy, [name], [name], [name], mod aortic root enlargement, aortic root 36 mm, ascending aorta 49 mm, [name], [name]    Past Surgical History:  Procedure Laterality Date  . Aortic valve  1982   prosthetic  val ve  . AORTIC VALVE REPLACEMENT N/A 11/21/2016   Procedure: REDO AORTIC VALVE REPLACEMENT (AVR);  Surgeon: Melrose Nakayama, MD;  Location: [facility];  Service: Open Heart Surgery;  Laterality: N/A;  . CARDIAC CATHETERIZATION    . CORONARY ARTERY BYPASS GRAFT N/A 11/21/2016   Procedure: CORONARY ARTERY BYPASS GRAFTING (CABG) x 2, using right leg greater saphenous vein harvested endoscopically;  Surgeon: Melrose Nakayama, MD;  Location: [place];  Service: Open Heart Surgery;  Laterality: N/A;  .  cyst removed  1976   right side of neck  . PACEMAKER IMPLANT N/A 11/26/2016   Procedure: Pacemaker Implant;  Surgeon: Deboraha Sprang, MD;  Location: [facility];  Service: Cardiovascular;  Laterality: N/A;  . TEE WITHOUT CARDIOVERSION N/A 11/21/2016   Procedure: TRANSESOPHAGEAL ECHOCARDIOGRAM (TEE);  Surgeon: Melrose Nakayama, MD;  Location: [place] [place];  Service: Open Heart Surgery;  Laterality: N/A;  . THORACIC AORTIC ANEURYSM REPAIR N/A 11/21/2016   Procedure: THORACIC ASCENDING ANEURYSM REPAIR (AAA);  Surgeon: Melrose Nakayama, MD;  Location: [place];  Service: Open Heart Surgery;  Laterality: N/A;  . TONSILLECTOMY       Current Outpatient Prescriptions  Medication Sig Dispense Refill  . acetaminophen (TYLENOL) 325 MG tablet Take 1-2 tablets (325-650 mg total) by mouth every 4 (four) hours as needed for mild pain.    [name] Kitchen allopurinol (ZYLOPRIM) 300 MG tablet Take 300 mg by mouth daily at 12 noon.     [name] Kitchen amoxicillin (AMOXIL) 500 MG capsule Take 2,000 mg by mouth See admin instructions. Take 4 capsules (2000 mg) by mouth 1 hour prior to dental procedures.    [name] Kitchen aspirin EC 81 MG EC tablet Take 1 tablet (81 mg total) by mouth daily.    [name] Kitchen atorvastatin (LIPITOR) 10 MG tablet Take 10 mg by mouth daily at 12 noon.     . carvedilol (COREG) 6.25 MG tablet TAKE 1 TABLET BY MOUTH TWO  TIMES DAILY (Patient taking differently: TAKE 1 TABLET (6.25 MG) BY MOUTH TWO  TIMES DAILY WITH BREAKFAST & WITH SUPPER) 180 tablet 3  . cholecalciferol (VITAMIN D) 1000 units tablet Take 1,000 Units by mouth daily with breakfast.    . Coenzyme Q10 50 MG CAPS Take 50 mg by mouth daily with breakfast.    . Cyanocobalamin (VITAMIN B-12) 5000 MCG SUBL Place 5,000 mcg under the tongue 2 (two) times a week. Wednesday & Saturday.    . ezetimibe (ZETIA) 10 MG tablet Take 5 mg by mouth daily. In the morning.    . fish oil-omega-3 fatty acids 1000 MG capsule Take 1 g by mouth 2 (two) times daily.     . furosemide (LASIX) 40  MG tablet Take 1 tablet (40 mg total) by mouth daily. 14 tablet 0  . hydrochlorothiazide (MICROZIDE) 12.5 MG capsule TAKE 1 CAPSULE BY MOUTH  DAILY (Patient taking differently: TAKE 1 CAPSULE (12.5 MG) BY MOUTH  DAILY WITH BREAKFAST) 90 capsule 3  . Magnesium 500 MG CAPS Take 500 mg by mouth every evening.     [name] Kitchen OVER THE COUNTER MEDICATION Take 1 capsule by mouth 2 (two) times daily.  AYER-TRIPHALA     . oxybutynin (DITROPAN) 5 MG tablet Take 1 tablet (5 mg total) by mouth every 8 (eight) hours as needed for bladder spasms. 90 tablet 0  . potassium chloride (K-DUR,KLOR-CON) 10 MEQ tablet TAKE 1 TABLET BY MOUTH  DAILY (Patient taking differently: TAKE 1 TABLET (10 MEQ) BY MOUTH  DAILY WITH BREAKFAST) 90 tablet 3  . telmisartan (MICARDIS) 80 MG tablet Take 80 mg by mouth daily. In the morning.  11  . traMADol (ULTRAM) 50 MG tablet Take 1-2 tablets (50-100 mg total) by mouth every 4 (four) hours as needed for moderate pain. 30 tablet 0  . warfarin (COUMADIN) 5 MG tablet Take 5 mg by mouth daily. Take 1.5 tablets (7.5 mg) by mouth on Monday, Wednesday, & Friday; Take 1 tablet (5 mg) by mouth on all other days of the week Saturday, Sunday, Tuesday, & Thursday.     No current facility-administered medications for this visit.     Allergies:   Patient has no known allergies.    Social History:  The patient  reports that he has never smoked. He has never used smokeless tobacco. He reports that he drinks alcohol. He reports that he does not use drugs.   Family History:  The patient's family history includes Colon cancer in his mother; Diabetes in his maternal grandfather; Emphysema in his father; Hyperlipidemia in his mother; Other in his father.    ROS:  Please see the history of present illness.   Otherwise, review of systems are positive for none.   All other systems are reviewed and negative.    PHYSICAL EXAM: VS:  BP (!) 110/54   Pulse 93   Ht _0  (1.753 m)   Wt 166 lb 12.8 oz (75.7 kg)    BMI 24.63 kg/m  , BMI Body mass index is 24.63 kg/m. GEN: Well nourished, well developed, in no acute distress  HEENT: normal  Neck: no JVD, carotid bruits, or masses Cardiac: RRR normal S1, mechanical S2. Respiratory:  clear to auscultation bilaterally, normal work of breathing GI: soft, nontender, nondistended, + BS MS: no deformity or atrophy  Skin: warm and dry, no rash Neuro:  Strength and sensation are intact Psych: euthymic mood, full affect   EKG:  EKG is not ordered today.   Recent Labs: 11/16/2016: ALT 41 11/22/2016: Magnesium 2.2 11/28/2016: BUN 21; Creatinine, Ser 1.13; Hemoglobin 8.1; Platelets 158; Potassium 4.8; Sodium 135    Lipid Panel No results found for: CHOL, TRIG, HDL, CHOLHDL, VLDL, LDLCALC, LDLDIRECT    Wt Readings from Last 3 Encounters:  01/01/17 166 lb 12.8 oz (75.7 kg)  12/25/16 165 lb (74.8 kg)  11/30/16 170 lb 11.2 oz (77.4 kg)      Other studies Reviewed:    ASSESSMENT AND PLAN:  1.  Essential Hypertension:  BP looks great.  Continue current meds  Bp has been great at home.   2. Aortic valve replacement:    Dakota Martin is now s/p [place] procedure - For his ascending aortic aneurysm. He now has a bioprosthetic valve. The plan is to stop Coumadin after another month. He will continue aspirin..   3. AV heart block:  Very had a pacemaker placed for continued AV block on POD 4   4. Hyperlipidemia: Continue Lipitor. He's followed by his primary medical doctor.  Current medicines are reviewed at length with the patient today.  The patient does not have concerns regarding medicines.  Labs/ tests ordered today include:  No orders of  the defined types were placed in this encounter.   Disposition:   FU with me  in 6   months.    Mertie Moores, MD  01/01/2017 2:04 PM    [facility] [address], [place], [place]  [address] Phone: [phone]; Fax: [phone]

## 2017-01-04 ENCOUNTER — Telehealth (HOSPITAL_COMMUNITY): Payer: Self-pay | Admitting: Pharmacy Technician

## 2017-01-04 NOTE — Telephone Encounter (Signed)
Cardiac Rehab Medication Review by a Pharmacist  Does the patient  feel that his/her medications are working for him/her?  yes  Has the patient been experiencing any side effects to the medications prescribed?  no  Does the patient measure his/her own blood pressure or blood glucose at home?  no   Does the patient have any problems obtaining medications due to transportation or finances?   no  Understanding of regimen: excellent Understanding of indications: good Potential of compliance: good    Pharmacist comments: Patient stated a change in his current warfarin dose of 5mg  three days of the week, and 7.5mg  four days of the week.  He also admits to not checking his blood pressure but has been told to start and is intending to do so. He has no issues with his medications at this time and has good access to his medications through mail order.    Bertis Ruddy, PharmD Pharmacy Resident Pager #: [phone] 01/04/2017 1:52 PM

## 2017-01-09 ENCOUNTER — Telehealth: Payer: Self-pay | Admitting: Cardiovascular Disease

## 2017-01-09 ENCOUNTER — Telehealth (HOSPITAL_COMMUNITY): Payer: Self-pay

## 2017-01-09 NOTE — Telephone Encounter (Signed)
Spoke with patient and reviewed Dr. Elmarie Shiley advice with him. He states his HR is currently 94 bpm at rest. I advised that we will continue to monitor to see if higher BP will lower the HR. He verbalized understanding and agreement to d/c HCTZ. He states his lasix Rx is about to run out because it was prescribed to reduce leg swelling s/p CABG. I advised him to call back Monday to report how he is doing and if necessary, he may hold the lasix for 1-2 days for continued hypotension. He agrees with plan and will call back to report. He thanked me for the call.

## 2017-01-09 NOTE — Telephone Encounter (Signed)
Hold HCTZ  Continue to measure BP  If dizziness and hypotnesion continue , Hold 1-2 doses of LAsix  Measure BMP in 2-3 weeks

## 2017-01-09 NOTE — Telephone Encounter (Signed)
*  Updated insurance benefits* [employer] Medicare - $20.00 co-payment, no deductible, out of pocket $4400/$2162.89 has been met, no co-insurance and no pre-authorization. Passport/reference [id number].

## 2017-01-09 NOTE — Telephone Encounter (Signed)
°  New Message   pt verbalized that he is calling for rn    Pt c/o BP issue: STAT if pt c/o blurred vision, one-sided weakness or slurred speech  1. What are your last 5 BP readings? 98/65  2. Are you having any other symptoms (ex. Dizziness, headache, blurred vision, passed out)? no  3. What is your BP issue? Low bp

## 2017-01-10 ENCOUNTER — Encounter (HOSPITAL_COMMUNITY): Payer: Self-pay

## 2017-01-10 ENCOUNTER — Encounter (HOSPITAL_COMMUNITY)
Admission: RE | Admit: 2017-01-10 | Discharge: 2017-01-10 | Disposition: A | Discharge status: Home / Self Care | Payer: Medicare Other | Source: Ambulatory Visit | Attending: Cardiovascular Disease | Admitting: Cardiovascular Disease

## 2017-01-10 VITALS — BP 110/50 | HR 90 | Ht 69.0 in | Wt 162.0 lb

## 2017-01-10 DIAGNOSIS — R011 Cardiac murmur, unspecified: Secondary | ICD-10-CM | POA: Diagnosis not present

## 2017-01-10 DIAGNOSIS — E785 Hyperlipidemia, unspecified: Secondary | ICD-10-CM | POA: Insufficient documentation

## 2017-01-10 DIAGNOSIS — Z951 Presence of aortocoronary bypass graft: Secondary | ICD-10-CM

## 2017-01-10 DIAGNOSIS — I10 Essential (primary) hypertension: Secondary | ICD-10-CM | POA: Diagnosis not present

## 2017-01-10 DIAGNOSIS — Z952 Presence of prosthetic heart valve: Secondary | ICD-10-CM | POA: Diagnosis not present

## 2017-01-10 DIAGNOSIS — Z950 Presence of cardiac pacemaker: Secondary | ICD-10-CM | POA: Diagnosis not present

## 2017-01-10 DIAGNOSIS — M109 Gout, unspecified: Secondary | ICD-10-CM | POA: Insufficient documentation

## 2017-01-10 NOTE — Progress Notes (Signed)
Khaleem Burchill Hartmann 76 y.o. male DOB: [birth year] MRN: [id number]      Nutrition Note  1. S/P CABG x 2   2. S/P AVR (aortic valve replacement)   3. Pacemaker    Past Medical History:  Diagnosis Date  . Aortic valve disease   . Arthritis   . Ascending aortic aneurysm ([place]) 02/14/2016   5 cm in 2015 // Chest CT 08/28/16:  Ascending thoracic aortic aneurysm 5.5 cm, Thoracic and abdominal aortic atherosclerosis, Remote granulomatous disease  . Colon polyp   . Gout   . Heart murmur    as a younger person,   . Hyperlipidemia   . Hypertension   . 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 S/P aortic valve replacement with metallic valve 18/56/3149   Due to congenital aortic stenosis // [name] mechanical valve done in 1982 // Echo 09/10/16: Mild focal basal septal hypertrophy, [name], [name], [name], mod aortic root enlargement, aortic root 36 mm, ascending aorta 49 mm, [name], [name]   Meds reviewed. Coumadin noted  HT: Ht Readings from Last 1 Encounters:  01/10/17 5' 9"  (1.753 m)    WT: Wt Readings from Last 3 Encounters:  01/10/17 162 lb 0.6 oz (73.5 kg)  01/01/17 166 lb 12.8 oz (75.7 kg)  12/25/16 165 lb (74.8 kg)     BMI 23.9   Current tobacco use? No  Labs:  Lipid Panel  No results found for: CHOL, TRIG, HDL, CHOLHDL, VLDL, LDLCALC, LDLDIRECT  Lab Results  Component Value Date   HGBA1C 5.9 (H) 11/16/2016   CBG (last 3)  No results for input(s): GLUCAP in the last 72 hours.  Nutrition Note Spoke with pt. Nutrition plan and goals reviewed with pt. Pt is following Step 2 of the Therapeutic Lifestyle Changes diet. Pt expressed understanding of the information reviewed. Pt aware of nutrition education classes offered and is unable to attend nutrition classes.  Nutrition Diagnosis ? Food-and nutrition-related knowledge deficit related to lack of exposure to information as  related to diagnosis of: ? CVD ? Pre-DM  Nutrition Intervention ? Pt's individual nutrition plan and goals reviewed with pt. ? Pt given handouts for: ? Nutrition I class ? Nutrition II class   Nutrition Goal(s):  ? Pt to describe the benefit of including fruits, vegetables, whole grains, and low-fat dairy products in a heart healthy meal plan.  Plan:  Pt to attend nutrition classes ? Nutrition I ? Nutrition II ? Portion Distortion  Will provide client-centered nutrition education as part of interdisciplinary care.   Monitor and evaluate progress toward nutrition goal with team.  Derek Mound, M.Ed, RD, LDN, CDE 01/10/2017 12:30 PM

## 2017-01-10 NOTE — Progress Notes (Signed)
Cardiac Individual Treatment Plan  Patient Details  Name: Dakota Martin MRN: [id number] Date of Birth: [birth year] Referring Provider:     [place] from 01/10/2017 in [place]  Referring Provider  Nahser, Philip MD      Initial Encounter Date:    CARDIAC REHAB PHASE II ORIENTATION from 01/10/2017 in [place]  Date  01/10/17  Referring Provider  Nahser, Arnette Norris MD      Visit Diagnosis: S/P CABG x 2  S/P AVR (aortic valve replacement)  Pacemaker  Patient's Home Medications on Admission:  Current Outpatient Prescriptions:  .  acetaminophen (TYLENOL) 325 MG tablet, Take 1-2 tablets (325-650 mg total) by mouth every 4 (four) hours as needed for mild pain., Disp: , Rfl:  .  allopurinol (ZYLOPRIM) 300 MG tablet, Take 300 mg by mouth daily at 12 noon. , Disp: , Rfl:  .  aspirin EC 81 MG EC tablet, Take 1 tablet (81 mg total) by mouth daily., Disp: , Rfl:  .  atorvastatin (LIPITOR) 10 MG tablet, Take 10 mg by mouth daily at 12 noon. , Disp: , Rfl:  .  carvedilol (COREG) 6.25 MG tablet, TAKE 1 TABLET BY MOUTH TWO  TIMES DAILY (Patient taking differently: TAKE 1 TABLET (6.25 MG) BY MOUTH TWO  TIMES DAILY WITH BREAKFAST & WITH SUPPER), Disp: 180 tablet, Rfl: 3 .  cholecalciferol (VITAMIN D) 1000 units tablet, Take 1,000 Units by mouth daily with breakfast., Disp: , Rfl:  .  Coenzyme Q10 50 MG CAPS, Take 50 mg by mouth daily with breakfast., Disp: , Rfl:  .  Cyanocobalamin (VITAMIN B-12) 5000 MCG SUBL, Place 5,000 mcg under the tongue 2 (two) times a week. Wednesday & Saturday., Disp: , Rfl:  .  ezetimibe (ZETIA) 10 MG tablet, Take 5 mg by mouth daily. In the morning., Disp: , Rfl:  .  fish oil-omega-3 fatty acids 1000 MG capsule, Take 1 g by mouth 2 (two) times daily. , Disp: , Rfl:  .  Magnesium 500 MG CAPS, Take 500 mg by mouth every evening. , Disp: , Rfl:  .  potassium chloride  (K-DUR,KLOR-CON) 10 MEQ tablet, TAKE 1 TABLET BY MOUTH  DAILY, Disp: 90 tablet, Rfl: 3 .  telmisartan (MICARDIS) 80 MG tablet, Take 80 mg by mouth daily. In the morning., Disp: , Rfl: 11 .  warfarin (COUMADIN) 5 MG tablet, Take 5 mg by mouth daily. Take 1.5 tablets (7.5 mg) by mouth on Monday, Wednesday, & Friday; Take 1 tablet (5 mg) by mouth on all other days of the week Saturday, Sunday, Tuesday, & Thursday., Disp: , Rfl:  .  amoxicillin (AMOXIL) 500 MG capsule, Take 2,000 mg by mouth See admin instructions. Take 4 capsules (2000 mg) by mouth 1 hour prior to dental procedures., Disp: , Rfl:  .  furosemide (LASIX) 40 MG tablet, Take 1 tablet (40 mg total) by mouth daily. (Patient not taking: Reported on 01/10/2017), Disp: 14 tablet, Rfl: 0 .  OVER THE COUNTER MEDICATION, Take 1 capsule by mouth 2 (two) times daily. AYER-TRIPHALA , Disp: , Rfl:  .  oxybutynin (DITROPAN) 5 MG tablet, Take 1 tablet (5 mg total) by mouth every 8 (eight) hours as needed for bladder spasms. (Patient not taking: Reported on 01/04/2017), Disp: 90 tablet, Rfl: 0 .  traMADol (ULTRAM) 50 MG tablet, Take 1-2 tablets (50-100 mg total) by mouth every 4 (four) hours as needed for moderate pain. (Patient not taking: Reported  on 01/04/2017), Disp: 30 tablet, Rfl: 0  Past Medical History: Past Medical History:  Diagnosis Date  . Aortic valve disease   . Arthritis   . 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 Colon polyp   . Gout   . Heart murmur    as a younger person,   . Hyperlipidemia   . Hypertension   . 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 S/P aortic valve replacement with metallic valve 69/62/9528   Due to congenital aortic stenosis // [name] mechanical valve done in 1982 // Echo 09/10/16: Mild focal basal septal  hypertrophy, [name], [name], [name], mod aortic root enlargement, aortic root 36 mm, ascending aorta 49 mm, [name], [name]    Tobacco Use: History  Smoking Status  . Never Smoker  Smokeless Tobacco  . Never Used    Labs: Recent Review Flowsheet Data    Labs for ITP Cardiac and Pulmonary Rehab Latest Ref Rng & Units 11/21/2016 11/21/2016 11/22/2016 11/22/2016 11/22/2016   Hemoglobin A1c 4.8 - 5.6 % - - - - -   PHART 7.350 - 7.450 - 7.503(H) 7.418 7.422 -   PCO2ART 32.0 - 48.0 mmHg - 28.9(L) 34.3 35.2 -   HCO3 20.0 - 28.0 mmol/L - 22.7 22.0 22.8 -   TCO2 0 - 100 mmol/L 21 24 23 24 22    ACIDBASEDEF 0.0 - 2.0 mmol/L - - 2.0 1.0 -   O2SAT % - 98.0 94.0 94.0 -      Capillary Blood Glucose: Lab Results  Component Value Date   GLUCAP 97 11/27/2016   GLUCAP 120 (H) 11/26/2016   GLUCAP 115 (H) 11/26/2016   GLUCAP 122 (H) 11/26/2016   GLUCAP 102 (H) 11/26/2016     Exercise Target Goals: Date: 01/10/17  Exercise Program Goal: Individual exercise prescription set with THRR, safety & activity barriers. Participant demonstrates ability to understand and report RPE using BORG scale, to self-measure pulse accurately, and to acknowledge the importance of the exercise prescription.  Exercise Prescription Goal: Starting with aerobic activity 30 plus minutes a day, 3 days per week for initial exercise prescription. Provide home exercise prescription and guidelines that participant acknowledges understanding prior to discharge.  Activity Barriers & Risk Stratification:     Activity Barriers & Cardiac Risk Stratification - 01/10/17 0851      Activity Barriers & Cardiac Risk Stratification   Activity Barriers Arthritis;Joint Problems;Other (comment)   Comments R RTC issue ( no surgery) chronic R ankle stiffness/pain   Cardiac Risk Stratification High      6 Minute Walk:     6 Minute Walk    Row Name 01/10/17 1153         6 Minute  Walk   Phase Initial     Distance 1478 feet     Walk Time 6 minutes     # of Rest Breaks 0     MPH 2.8     METS 3.21     RPE 11     VO2 Peak 11.24     Symptoms No     Resting HR 90 bpm     Resting BP 110/50     Resting Oxygen Saturation  98 %     Exercise  Oxygen Saturation  during 6 min walk 100 %     Max Ex. HR 108 bpm     Max Ex. BP 118/58     2 Minute Post BP 90/50  given banana/drinking water recheck 98/60        Oxygen Initial Assessment:   Oxygen Re-Evaluation:   Oxygen Discharge (Final Oxygen Re-Evaluation):   Initial Exercise Prescription:     Initial Exercise Prescription - 01/10/17 1100      Date of Initial Exercise RX and Referring Provider   Date 01/10/17   Referring Provider Nahser, Arnette Norris MD     Bike   Level 0.7   Minutes 10   METs 2.76     NuStep   Level 3   SPM 80   Minutes 10   METs 2.5     Track   Laps 10   Minutes 10   METs 2.74     Prescription Details   Frequency (times per week) 3   Duration Progress to 30 minutes of continuous aerobic without signs/symptoms of physical distress     Intensity   THRR 40-80% of Max Heartrate 58-116   Ratings of Perceived Exertion 11-13   Perceived Dyspnea 0-4     Progression   Progression Continue to progress workloads to maintain intensity without signs/symptoms of physical distress.     Resistance Training   Training Prescription Yes   Weight 3lbs   Reps 10-15      Perform Capillary Blood Glucose checks as needed.  Exercise Prescription Changes:   Exercise Comments:   Exercise Goals and Review:     Exercise Goals    Row Name 01/10/17 0854             Exercise Goals   Increase Physical Activity Yes  return to yardwork       Intervention Provide advice, education, support and counseling about physical activity/exercise needs.;Develop an individualized exercise prescription for aerobic and resistive training based on initial evaluation findings, risk stratification,  comorbidities and participant's personal goals.       Expected Outcomes Achievement of increased cardiorespiratory fitness and enhanced flexibility, muscular endurance and strength shown through measurements of functional capacity and personal statement of participant.       Increase Strength and Stamina Yes  return to playing tennis and return to gym like routine       Intervention Provide advice, education, support and counseling about physical activity/exercise needs.;Develop an individualized exercise prescription for aerobic and resistive training based on initial evaluation findings, risk stratification, comorbidities and participant's personal goals.       Expected Outcomes Achievement of increased cardiorespiratory fitness and enhanced flexibility, muscular endurance and strength shown through measurements of functional capacity and personal statement of participant.       Able to understand and use rate of perceived exertion (RPE) scale Yes       Intervention Provide education and explanation on how to use RPE scale       Expected Outcomes Short Term: Able to use RPE daily in rehab to express subjective intensity level;Long Term:  Able to use RPE to guide intensity level when exercising independently       Knowledge and understanding of Target Heart Rate Range (THRR) Yes       Intervention Provide education and explanation of THRR including how the numbers were predicted and where they are located for reference       Expected Outcomes Short Term: Able to state/look up THRR;Long Term:  Able to use THRR to govern intensity when exercising independently;Short Term: Able to use daily as guideline for intensity in rehab       Able to check pulse independently Yes       Intervention Provide education and demonstration on how to check pulse in carotid and radial arteries.;Review the importance of being able to check your own pulse for safety during independent exercise       Expected Outcomes Short  Term: Able to explain why pulse checking is important during independent exercise;Long Term: Able to check pulse independently and accurately       Understanding of Exercise Prescription Yes       Intervention Provide education, explanation, and written materials on patient's individual exercise prescription       Expected Outcomes Short Term: Able to explain program exercise prescription;Long Term: Able to explain home exercise prescription to exercise independently          Exercise Goals Re-Evaluation :    Discharge Exercise Prescription (Final Exercise Prescription Changes):   Nutrition:  Target Goals: Understanding of nutrition guidelines, daily intake of sodium <1585m, cholesterol <2077m calories 30% from fat and 7% or less from saturated fats, daily to have 5 or more servings of fruits and vegetables.  Biometrics:     Pre Biometrics - 01/10/17 1204      Pre Biometrics   Waist Circumference 37 inches   Hip Circumference 35.75 inches   Waist to Hip Ratio 1.03 %   Triceps Skinfold 15 mm   % Body Fat 24.9 %   Grip Strength 40 kg   Flexibility 0 in   Single Leg Stand 24 seconds       Nutrition Therapy Plan and Nutrition Goals:     Nutrition Therapy & Goals - 01/10/17 1234      Nutrition Therapy   Diet Therapeutic Lifestyle Changes   Drug/Food Interactions Coumadin/Vit K     Personal Nutrition Goals   Nutrition Goal Pt to describe the benefit of including fruits, vegetables, whole grains, and low-fat dairy products in a heart healthy meal plan.     Intervention Plan   Intervention Prescribe, educate and counsel regarding individualized specific dietary modifications aiming towards targeted core components such as weight, hypertension, lipid management, diabetes, heart failure and other comorbidities.   Expected Outcomes Short Term Goal: Understand basic principles of dietary content, such as calories, fat, sodium, cholesterol and nutrients.;Long Term Goal:  Adherence to prescribed nutrition plan.      Nutrition Discharge: Nutrition Scores:     Nutrition Assessments - 01/10/17 1229      MEDFICTS Scores   Pre Score 33      Nutrition Goals Re-Evaluation:   Nutrition Goals Re-Evaluation:   Nutrition Goals Discharge (Final Nutrition Goals Re-Evaluation):   Psychosocial: Target Goals: Acknowledge presence or absence of significant depression and/or stress, maximize coping skills, provide positive support system. Participant is able to verbalize types and ability to use techniques and skills needed for reducing stress and depression.  Initial Review & Psychosocial Screening:     Initial Psych Review & Screening - 01/10/17 1329      Initial Review   Current issues with None Identified     Family Dynamics   Good Support System? Yes     Barriers   Psychosocial barriers to participate in program The patient should benefit from training in stress management and relaxation.     Screening Interventions   Interventions Encouraged to exercise  Quality of Life Scores:     Quality of Life - 01/10/17 1147      Quality of Life Scores   Health/Function Pre 25.5 %   Socioeconomic Pre 27.86 %   Psych/Spiritual Pre 26.79 %   Family Pre 30 %   GLOBAL Pre 26.91 %      PHQ-9: Recent Review Flowsheet Data    Depression screen [facility] 2/9 08/19/2015 01/12/2015   Decreased Interest 0 0   Down, Depressed, Hopeless 0 0   PHQ - 2 Score 0 0     Interpretation of Total Score  Total Score Depression Severity:  1-4 = Minimal depression, 5-9 = Mild depression, 10-14 = Moderate depression, 15-19 = Moderately severe depression, 20-27 = Severe depression   Psychosocial Evaluation and Intervention:   Psychosocial Re-Evaluation:   Psychosocial Discharge (Final Psychosocial Re-Evaluation):   Vocational Rehabilitation: Provide vocational rehab assistance to qualifying candidates.   Vocational Rehab Evaluation &  Intervention:   Education: Education Goals: Education classes will be provided on a weekly basis, covering required topics. Participant will state understanding/return demonstration of topics presented.  Learning Barriers/Preferences:     Learning Barriers/Preferences - 01/10/17 0851      Learning Barriers/Preferences   Learning Barriers Sight   Learning Preferences Written Material      Education Topics: Count Your Pulse:  -Group instruction provided by verbal instruction, demonstration, patient participation and written materials to support subject.  Instructors address importance of being able to find your pulse and how to count your pulse when at home without a heart monitor.  Patients get hands on experience counting their pulse with staff help and individually.   Heart Attack, Angina, and Risk Factor Modification:  -Group instruction provided by verbal instruction, video, and written materials to support subject.  Instructors address signs and symptoms of angina and heart attacks.    Also discuss risk factors for heart disease and how to make changes to improve heart health risk factors.   Functional Fitness:  -Group instruction provided by verbal instruction, demonstration, patient participation, and written materials to support subject.  Instructors address safety measures for doing things around the house.  Discuss how to get up and down off the floor, how to pick things up properly, how to safely get out of a chair without assistance, and balance training.   Meditation and Mindfulness:  -Group instruction provided by verbal instruction, patient participation, and written materials to support subject.  Instructor addresses importance of mindfulness and meditation practice to help reduce stress and improve awareness.  Instructor also leads participants through a meditation exercise.    Stretching for Flexibility and Mobility:  -Group instruction provided by verbal  instruction, patient participation, and written materials to support subject.  Instructors lead participants through series of stretches that are designed to increase flexibility thus improving mobility.  These stretches are additional exercise for major muscle groups that are typically performed during regular warm up and cool down.   Hands Only CPR:  -Group verbal, video, and participation provides a basic overview of AHA guidelines for community CPR. Role-play of emergencies allow participants the opportunity to practice calling for help and chest compression technique with discussion of AED use.   Hypertension: -Group verbal and written instruction that provides a basic overview of hypertension including the most recent diagnostic guidelines, risk factor reduction with self-care instructions and medication management.    Nutrition I class: Heart Healthy Eating:  -Group instruction provided by PowerPoint slides, verbal discussion, and written materials to  support subject matter. The instructor gives an explanation and review of the Therapeutic Lifestyle Changes diet recommendations, which includes a discussion on lipid goals, dietary fat, sodium, fiber, plant stanol/sterol esters, sugar, and the components of a well-balanced, healthy diet.   Nutrition II class: Lifestyle Skills:  -Group instruction provided by PowerPoint slides, verbal discussion, and written materials to support subject matter. The instructor gives an explanation and review of label reading, grocery shopping for heart health, heart healthy recipe modifications, and ways to make healthier choices when eating out.   Diabetes Question & Answer:  -Group instruction provided by PowerPoint slides, verbal discussion, and written materials to support subject matter. The instructor gives an explanation and review of diabetes co-morbidities, pre- and post-prandial blood glucose goals, pre-exercise blood glucose goals, signs, symptoms,  and treatment of hypoglycemia and hyperglycemia, and foot care basics.   Diabetes Blitz:  -Group instruction provided by PowerPoint slides, verbal discussion, and written materials to support subject matter. The instructor gives an explanation and review of the physiology behind type 1 and type 2 diabetes, diabetes medications and rational behind using different medications, pre- and post-prandial blood glucose recommendations and Hemoglobin A1c goals, diabetes diet, and exercise including blood glucose guidelines for exercising safely.    Portion Distortion:  -Group instruction provided by PowerPoint slides, verbal discussion, written materials, and food models to support subject matter. The instructor gives an explanation of serving size versus portion size, changes in portions sizes over the last 20 years, and what consists of a serving from each food group.   Stress Management:  -Group instruction provided by verbal instruction, video, and written materials to support subject matter.  Instructors review role of stress in heart disease and how to cope with stress positively.     Exercising on Your Own:  -Group instruction provided by verbal instruction, power point, and written materials to support subject.  Instructors discuss benefits of exercise, components of exercise, frequency and intensity of exercise, and end points for exercise.  Also discuss use of nitroglycerin and activating EMS.  Review options of places to exercise outside of rehab.  Review guidelines for sex with heart disease.   Cardiac Drugs I:  -Group instruction provided by verbal instruction and written materials to support subject.  Instructor reviews cardiac drug classes: antiplatelets, anticoagulants, beta blockers, and statins.  Instructor discusses reasons, side effects, and lifestyle considerations for each drug class.   Cardiac Drugs II:  -Group instruction provided by verbal instruction and written materials to  support subject.  Instructor reviews cardiac drug classes: angiotensin converting enzyme inhibitors (ACE-I), angiotensin II receptor blockers (ARBs), nitrates, and calcium channel blockers.  Instructor discusses reasons, side effects, and lifestyle considerations for each drug class.   Anatomy and Physiology of the Circulatory System:  Group verbal and written instruction and models provide basic cardiac anatomy and physiology, with the coronary electrical and arterial systems. Review of: AMI, Angina, Valve disease, Heart Failure, Peripheral Artery Disease, Cardiac Arrhythmia, Pacemakers, and the ICD.   Other Education:  -Group or individual verbal, written, or video instructions that support the educational goals of the cardiac rehab program.   Knowledge Questionnaire Score:     Knowledge Questionnaire Score - 01/10/17 1144      Knowledge Questionnaire Score   Pre Score 23/24      Core Components/Risk Factors/Patient Goals at Admission:     Personal Goals and Risk Factors at Admission - 01/10/17 1204      Core Components/Risk Factors/Patient Goals on Admission  Hypertension Yes   Intervention Provide education on lifestyle modifcations including regular physical activity/exercise, weight management, moderate sodium restriction and increased consumption of fresh fruit, vegetables, and low fat dairy, alcohol moderation, and smoking cessation.;Monitor prescription use compliance.   Expected Outcomes Short Term: Continued assessment and intervention until BP is < 140/28m HG in hypertensive participants. < 130/859mHG in hypertensive participants with diabetes, heart failure or chronic kidney disease.;Long Term: Maintenance of blood pressure at goal levels.   Lipids Yes   Intervention Provide education and support for participant on nutrition & aerobic/resistive exercise along with prescribed medications to achieve LDL <7019mHDL >59m107m Expected Outcomes Short Term: Participant states  understanding of desired cholesterol values and is compliant with medications prescribed. Participant is following exercise prescription and nutrition guidelines.;Long Term: Cholesterol controlled with medications as prescribed, with individualized exercise RX and with personalized nutrition plan. Value goals: LDL < 70mg27mL > 40 mg.      Core Components/Risk Factors/Patient Goals Review:    Core Components/Risk Factors/Patient Goals at Discharge (Final Review):    ITP Comments:     ITP Comments    Row Name 01/10/17 0848           ITP Comments Medical Director, Dr. TraciFransico Him     Comments:  Patient attended orientation from [address] [place] to review rules and guidelines for program. Completed 6 minute walk test, Intitial ITP, and exercise prescription.  VSS low exit bp noted but increased with water and banana.  Pt remarked he recently had one of his bp meds taken away due to low bp. Telemetry- V demand Pacing in the 90's. Pt asymptomatic with no complaints.  Brief Psychosocial Assessment reveal no barriers to participating in cardiac rehab.  Pt is eager to learn limitations and boundaries. CarleCherre Huger Cardiac and PulmoTraining and development officer

## 2017-01-14 ENCOUNTER — Encounter (HOSPITAL_COMMUNITY)
Admission: RE | Admit: 2017-01-14 | Discharge: 2017-01-14 | Disposition: A | Discharge status: Home / Self Care | Payer: Medicare Other | Source: Ambulatory Visit | Attending: Cardiovascular Disease | Admitting: Cardiovascular Disease

## 2017-01-14 DIAGNOSIS — Z951 Presence of aortocoronary bypass graft: Secondary | ICD-10-CM

## 2017-01-14 DIAGNOSIS — Z952 Presence of prosthetic heart valve: Secondary | ICD-10-CM

## 2017-01-14 DIAGNOSIS — Z950 Presence of cardiac pacemaker: Secondary | ICD-10-CM

## 2017-01-14 NOTE — Progress Notes (Signed)
Daily Session Note  Patient Details  Name: Travious Barrett Espe MRN: [id number] Date of Birth: 09/01/1940 Referring Provider:     CARDIAC REHAB PHASE II ORIENTATION from 01/10/2017 in [facility]  Referring Provider  Nahser, Philip MD      Encounter Date: 01/14/2017  Check In:   Capillary Blood Glucose: No results found for this or any previous visit (from the past 24 hour(s)).    History  Smoking Status  . Never Smoker  Smokeless Tobacco  . Never Used    Goals Met:  Exercise tolerated well  Goals Unmet:  Not Applicable  Comments: Barry started cardiac rehab today.  Pt tolerated light exercise without difficulty. VSS, telemetry-Vapced, asymptomatic.  Medication list reconciled. Pt denies barriers to medicaiton compliance.  PSYCHOSOCIAL ASSESSMENT:  PHQ-0. Pt exhibits positive coping skills, hopeful outlook with supportive family. No psychosocial needs identified at this time, no psychosocial interventions necessary.    Pt enjoys playing tennis.   Pt oriented to exercise equipment and routine.    Understanding verbalized. Maria Whitaker, RN,BSN 01/14/2017 10:58 AM   Dr. Traci Turner is Medical Director for Cardiac Rehab at [facility]. 

## 2017-01-14 NOTE — Telephone Encounter (Signed)
Called patient to see how he is feeling. He states he went to Cardiac Rehab today and BP was stable throughout the workout. He states he was asked why he is taking Klor Con since he has stopped lasix. He just stopped lasix in the last few days. I reviewed his last K+ level from 8/1, which was 4.8 and advised that he may stop Klor Con since he has stopped lasix. He verbalized understanding and agreement and thanked me for the call.

## 2017-01-16 ENCOUNTER — Encounter (HOSPITAL_COMMUNITY)
Admission: RE | Admit: 2017-01-16 | Discharge: 2017-01-16 | Disposition: A | Discharge status: Home / Self Care | Payer: Medicare Other | Source: Ambulatory Visit | Attending: Cardiovascular Disease | Admitting: Cardiovascular Disease

## 2017-01-16 DIAGNOSIS — Z951 Presence of aortocoronary bypass graft: Secondary | ICD-10-CM

## 2017-01-16 DIAGNOSIS — Z950 Presence of cardiac pacemaker: Secondary | ICD-10-CM

## 2017-01-16 DIAGNOSIS — Z952 Presence of prosthetic heart valve: Secondary | ICD-10-CM

## 2017-01-18 ENCOUNTER — Encounter (HOSPITAL_COMMUNITY)
Admission: RE | Admit: 2017-01-18 | Discharge: 2017-01-18 | Disposition: A | Discharge status: Home / Self Care | Payer: Medicare Other | Source: Ambulatory Visit | Attending: Cardiovascular Disease | Admitting: Cardiovascular Disease

## 2017-01-18 DIAGNOSIS — Z951 Presence of aortocoronary bypass graft: Secondary | ICD-10-CM | POA: Diagnosis not present

## 2017-01-18 DIAGNOSIS — Z950 Presence of cardiac pacemaker: Secondary | ICD-10-CM

## 2017-01-18 DIAGNOSIS — Z952 Presence of prosthetic heart valve: Secondary | ICD-10-CM

## 2017-01-21 ENCOUNTER — Encounter (HOSPITAL_COMMUNITY)
Admission: RE | Admit: 2017-01-21 | Discharge: 2017-01-21 | Disposition: A | Discharge status: Home / Self Care | Payer: Medicare Other | Source: Ambulatory Visit | Attending: Cardiovascular Disease | Admitting: Cardiovascular Disease

## 2017-01-21 ENCOUNTER — Other Ambulatory Visit: Payer: Self-pay | Admitting: Thoracic Surgery (Cardiothoracic Vascular Surgery)

## 2017-01-21 DIAGNOSIS — Z951 Presence of aortocoronary bypass graft: Secondary | ICD-10-CM

## 2017-01-21 DIAGNOSIS — Z952 Presence of prosthetic heart valve: Secondary | ICD-10-CM

## 2017-01-21 DIAGNOSIS — Z950 Presence of cardiac pacemaker: Secondary | ICD-10-CM

## 2017-01-21 NOTE — Progress Notes (Signed)
Reviewed home exercise guidelines with patient including endpoints, temperature precautions, target heart rate and rate of perceived exertion. Pt is currently walking 20-30 minutes, 3 days/week as his mode of home exercise. Encouraged pt to increase to 30 minutes, 3 days/week, and pt is agreeable to this. Pt would like to resume participation in senior chair yoga when cleared by the physician to do so. Pt will discuss with surgeon at [facility] on 01/22/17. Pt voices understanding of instructions given. Sol Passer, MS, ACSM CEP

## 2017-01-21 NOTE — Progress Notes (Signed)
Dakota Martin 76 y.o. male DOB: June 25, 1940 MRN: [id number]      Nutrition Note Note Spoke with pt. Nutrition plan and survey reviewed with pt. Pt is following Step 2 of the Therapeutic Lifestyle Changes diet. Pt expressed understanding of the information reviewed. Pt aware of nutrition education classes offered.  Nutrition Diagnosis ? Food-and nutrition-related knowledge deficit related to lack of exposure to information as related to diagnosis of: ? CVD ? Pre-DM  Nutrition Intervention ? Pt's individual nutrition plan and goals reviewed with pt. ? Benefits of adopting Therapeutic Lifestyle Changes discussed when Medficts reviewed.    Nutrition Goal(s):  ? Pt to describe the benefit of including fruits, vegetables, whole grains, and low-fat dairy products in a heart healthy meal plan.  Plan:  Pt to attend nutrition classes ? Portion Distortion  Will provide client-centered nutrition education as part of interdisciplinary care.   Monitor and evaluate progress toward nutrition goal with team.  Derek Mound, M.Ed, RD, LDN, CDE 01/21/2017 10:35 AM

## 2017-01-22 ENCOUNTER — Ambulatory Visit
Admission: RE | Admit: 2017-01-22 | Discharge: 2017-01-22 | Disposition: A | Discharge status: Home / Self Care | Payer: Medicare Other | Source: Ambulatory Visit | Attending: Thoracic Surgery (Cardiothoracic Vascular Surgery) | Admitting: Thoracic Surgery (Cardiothoracic Vascular Surgery)

## 2017-01-22 ENCOUNTER — Encounter: Payer: Self-pay | Admitting: Thoracic Surgery (Cardiothoracic Vascular Surgery)

## 2017-01-22 ENCOUNTER — Ambulatory Visit: Payer: Self-pay | Admitting: Thoracic Surgery (Cardiothoracic Vascular Surgery)

## 2017-01-22 VITALS — BP 135/75 | HR 80 | Resp 16 | Ht 69.0 in | Wt 166.0 lb

## 2017-01-22 DIAGNOSIS — Z9889 Other specified postprocedural states: Secondary | ICD-10-CM

## 2017-01-22 DIAGNOSIS — Z952 Presence of prosthetic heart valve: Secondary | ICD-10-CM

## 2017-01-22 DIAGNOSIS — Z951 Presence of aortocoronary bypass graft: Secondary | ICD-10-CM

## 2017-01-22 DIAGNOSIS — Z8679 Personal history of other diseases of the circulatory system: Secondary | ICD-10-CM

## 2017-01-22 NOTE — Progress Notes (Unsigned)
[address]       [place] [address]             [phone]       HPI: Mr. Lindaman returns for a scheduled follow-up visit  Mr. Renwick is a 76 year old man with a history of aortic valve replacement in 1982, hypertension, hyperlipidemia, arthritis, and gout. He developed an ascending aneurysm which I followed over time. He had significant interval growth over 6 month period earlier this year. On 11/21/2016 I did a redo sternotomy with a redo aortic valve replacement with a bovine pericardial valve to replace an old [place] valve and replaced his ascending aorta. Also did coronary bypass grafting 2. 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  I saw him in the office about a month ago. He was doing well at that time. He did have some swelling in his leg and numbness in the little fingers on both hands. He was having a little depression. He also is having some shortness of breath with exertion.  In the interim since his last visit started cardiac rehabilitation. He fills much better he says he is almost back to his "normal self." He does still have a little numbness in his fingers bilaterally. His exercise tolerance has improved significantly. He's never had much incisional pain.  Past Medical History:  Diagnosis Date  . Aortic valve disease   . Arthritis   . Ascending aortic aneurysm ([facility]) 02/14/2016   5 cm in 2015 // Chest CT 08/28/16:  Ascending thoracic aortic aneurysm 5.5 cm, Thoracic and abdominal aortic atherosclerosis, Remote granulomatous disease  . Colon polyp   . Gout   . Heart murmur    as a younger person,   . Hyperlipidemia   . Hypertension   . 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 S/P aortic valve replacement with metallic valve 10/21/7626   Due to congenital aortic stenosis // [name] mechanical valve done in 1982 // Echo 09/10/16: Mild focal basal septal  hypertrophy, [name], [name], [name], mod aortic root enlargement, aortic root 36 mm, ascending aorta 49 mm, [name], [name]      Current Outpatient Prescriptions  Medication Sig Dispense Refill  . acetaminophen (TYLENOL) 325 MG tablet Take 1-2 tablets (325-650 mg total) by mouth every 4 (four) hours as needed for mild pain.    [name] Kitchen allopurinol (ZYLOPRIM) 300 MG tablet Take 300 mg by mouth daily at 12 noon.     [name] Kitchen amoxicillin (AMOXIL) 500 MG capsule Take 2,000 mg by mouth See admin instructions. Take 4 capsules (2000 mg) by mouth 1 hour prior to dental procedures.    [name] Kitchen aspirin EC 81 MG EC tablet Take 1 tablet (81 mg total) by mouth daily.    [name] Kitchen atorvastatin (LIPITOR) 10 MG tablet Take 10 mg by mouth daily at 12 noon.     . carvedilol (COREG) 6.25 MG tablet TAKE 1 TABLET BY MOUTH TWO  TIMES DAILY (Patient taking differently: TAKE 1 TABLET (6.25 MG) BY MOUTH TWO  TIMES DAILY WITH BREAKFAST & WITH SUPPER) 180 tablet 3  . cholecalciferol (VITAMIN D) 1000 units tablet Take 5,000 Units by mouth daily with breakfast.     . Coenzyme Q10 50 MG CAPS Take 50 mg by mouth daily with breakfast.    . Cyanocobalamin (VITAMIN B-12) 5000 MCG SUBL  Place 5,000 mcg under the tongue 2 (two) times a week. Wednesday & Saturday.    . ezetimibe (ZETIA) 10 MG tablet Take 5 mg by mouth daily. In the morning.    . fish oil-omega-3 fatty acids 1000 MG capsule Take 1 g by mouth 2 (two) times daily.     . Magnesium 500 MG CAPS Take 500 mg by mouth every evening.     [name] Kitchen OVER THE COUNTER MEDICATION Take 1 capsule by mouth 2 (two) times daily. AYER-TRIPHALA     . telmisartan (MICARDIS) 80 MG tablet Take 80 mg by mouth daily. In the morning.  11   No current facility-administered medications for this visit.     Physical Exam BP 135/75 (BP Location: Right Arm, Patient Position: Sitting, Cuff Size: Normal)   Pulse 80   Resp 16   Ht _0  (1.753 m)   Wt 166 lb (75.3 kg)   SpO2 96%  Comment: ON RA  BMI 24.63 kg/m  76 year old man in no acute distress Alert and oriented 3 with no focal deficits Sternum stable incision well-healed, pacemaker incision well-healed Lungs clear with equal breath sounds bilaterally Cardiac regular rate and rhythm normal S1 and S2, faint systolic murmur  Diagnostic Tests: CHEST  2 VIEW  COMPARISON:  Chest x-ray of 12/25/2016  FINDINGS: There may be very tiny pleural effusions slightly blunting the posterior costophrenic angles but no significant pleural effusion is seen. No pneumonia is noted. Mediastinal and hilar contours are unchanged. The heart is mildly enlarged and stable. Median sternotomy sutures are noted from prior CABG and aortic valve replacement. Permanent pacemaker remains. There are degenerative changes in the mid to lower thoracic spine.  IMPRESSION: 1. Only tiny pleural effusions remain. 2. No pneumonia. 3. Stable borderline cardiomegaly with pacemaker.   Electronically Signed   By: Ivar Drape M.D.   On: 01/22/2017 11:07 I personally reviewed the chest x-ray and concur with findings noted above  Impression: Mr. Rieth is a 76 year old gentleman who is now 2 months out from a redo sternotomy 2 do a redo aortic valve replacement and repair an ascending aneurysm. He had complete heart block postoperatively and had a permanent pacemaker placed. Other than that he has done exceptionally well. He has minimal discomfort. He has good exercise tolerance and is working in cardiac rehabilitation.  At this point with a tissue valve in place and being 8 weeks out from surgery no longer needs to be on Coumadin. We will go ahead and stop that medication. He will continue with an aspirin a day.  At this point his activities are unrestricted. I did caution him to build into new activities gradually.  I will plan to see him back in 2 months for one year follow-up visit. We'll do a CT angiogram chest at that time to  evaluate the rest of the aorta.  Plan: Return in 10 months with CT angiogram of chest  Melrose Nakayama, MD [facility] [phone]

## 2017-01-23 ENCOUNTER — Encounter (HOSPITAL_COMMUNITY)
Admission: RE | Admit: 2017-01-23 | Discharge: 2017-01-23 | Disposition: A | Discharge status: Home / Self Care | Payer: Medicare Other | Source: Ambulatory Visit | Attending: Cardiovascular Disease | Admitting: Cardiovascular Disease

## 2017-01-23 DIAGNOSIS — Z952 Presence of prosthetic heart valve: Secondary | ICD-10-CM

## 2017-01-23 DIAGNOSIS — Z951 Presence of aortocoronary bypass graft: Secondary | ICD-10-CM

## 2017-01-23 DIAGNOSIS — Z950 Presence of cardiac pacemaker: Secondary | ICD-10-CM

## 2017-01-25 ENCOUNTER — Encounter (HOSPITAL_COMMUNITY)
Admission: RE | Admit: 2017-01-25 | Discharge: 2017-01-25 | Disposition: A | Discharge status: Home / Self Care | Payer: Medicare Other | Source: Ambulatory Visit | Attending: Cardiovascular Disease | Admitting: Cardiovascular Disease

## 2017-01-25 DIAGNOSIS — Z951 Presence of aortocoronary bypass graft: Secondary | ICD-10-CM | POA: Diagnosis not present

## 2017-01-25 DIAGNOSIS — Z952 Presence of prosthetic heart valve: Secondary | ICD-10-CM

## 2017-01-28 ENCOUNTER — Encounter (HOSPITAL_COMMUNITY)
Admission: RE | Admit: 2017-01-28 | Discharge: 2017-01-28 | Disposition: A | Discharge status: Home / Self Care | Payer: Medicare Other | Source: Ambulatory Visit | Attending: Cardiovascular Disease | Admitting: Cardiovascular Disease

## 2017-01-28 DIAGNOSIS — Z951 Presence of aortocoronary bypass graft: Secondary | ICD-10-CM | POA: Diagnosis present

## 2017-01-28 DIAGNOSIS — E785 Hyperlipidemia, unspecified: Secondary | ICD-10-CM | POA: Insufficient documentation

## 2017-01-28 DIAGNOSIS — I10 Essential (primary) hypertension: Secondary | ICD-10-CM | POA: Diagnosis not present

## 2017-01-28 DIAGNOSIS — Z952 Presence of prosthetic heart valve: Secondary | ICD-10-CM

## 2017-01-28 DIAGNOSIS — R011 Cardiac murmur, unspecified: Secondary | ICD-10-CM | POA: Insufficient documentation

## 2017-01-28 DIAGNOSIS — M109 Gout, unspecified: Secondary | ICD-10-CM | POA: Insufficient documentation

## 2017-01-28 DIAGNOSIS — Z950 Presence of cardiac pacemaker: Secondary | ICD-10-CM | POA: Diagnosis not present

## 2017-01-30 ENCOUNTER — Encounter (HOSPITAL_COMMUNITY)
Admission: RE | Admit: 2017-01-30 | Discharge: 2017-01-30 | Disposition: A | Discharge status: Home / Self Care | Payer: Medicare Other | Source: Ambulatory Visit | Attending: Cardiovascular Disease | Admitting: Cardiovascular Disease

## 2017-01-30 DIAGNOSIS — Z950 Presence of cardiac pacemaker: Secondary | ICD-10-CM

## 2017-01-30 DIAGNOSIS — Z951 Presence of aortocoronary bypass graft: Secondary | ICD-10-CM | POA: Diagnosis not present

## 2017-01-30 DIAGNOSIS — Z952 Presence of prosthetic heart valve: Secondary | ICD-10-CM

## 2017-02-01 ENCOUNTER — Encounter (HOSPITAL_COMMUNITY)
Admission: RE | Admit: 2017-02-01 | Discharge: 2017-02-01 | Disposition: A | Discharge status: Home / Self Care | Payer: Medicare Other | Source: Ambulatory Visit | Attending: Cardiovascular Disease | Admitting: Cardiovascular Disease

## 2017-02-01 DIAGNOSIS — Z952 Presence of prosthetic heart valve: Secondary | ICD-10-CM

## 2017-02-01 DIAGNOSIS — Z951 Presence of aortocoronary bypass graft: Secondary | ICD-10-CM | POA: Diagnosis not present

## 2017-02-04 ENCOUNTER — Encounter (HOSPITAL_COMMUNITY)
Admission: RE | Admit: 2017-02-04 | Discharge: 2017-02-04 | Disposition: A | Discharge status: Home / Self Care | Payer: Medicare Other | Source: Ambulatory Visit | Attending: Cardiovascular Disease | Admitting: Cardiovascular Disease

## 2017-02-04 DIAGNOSIS — Z952 Presence of prosthetic heart valve: Secondary | ICD-10-CM

## 2017-02-04 DIAGNOSIS — Z951 Presence of aortocoronary bypass graft: Secondary | ICD-10-CM

## 2017-02-06 ENCOUNTER — Encounter (HOSPITAL_COMMUNITY)
Admission: RE | Admit: 2017-02-06 | Discharge: 2017-02-06 | Disposition: A | Discharge status: Home / Self Care | Payer: Medicare Other | Source: Ambulatory Visit | Attending: Cardiovascular Disease | Admitting: Cardiovascular Disease

## 2017-02-06 DIAGNOSIS — Z951 Presence of aortocoronary bypass graft: Secondary | ICD-10-CM | POA: Diagnosis not present

## 2017-02-06 DIAGNOSIS — Z952 Presence of prosthetic heart valve: Secondary | ICD-10-CM

## 2017-02-06 DIAGNOSIS — Z950 Presence of cardiac pacemaker: Secondary | ICD-10-CM

## 2017-02-06 NOTE — Progress Notes (Signed)
Cardiac Individual Treatment Plan  Patient Details  Name: Dakota Martin MRN: [id number] Date of Birth: 26-Jun-1940 Referring Provider:     [facility] from 01/10/2017 in [place]  Referring Provider  Nahser, Philip MD      Initial Encounter Date:    CARDIAC REHAB PHASE II ORIENTATION from 01/10/2017 in [place]  Date  01/10/17  Referring Provider  Nahser, Arnette Norris MD      Visit Diagnosis: S/P CABG x 2  S/P AVR (aortic valve replacement)  Pacemaker  Patient's Home Medications on Admission:  Current Outpatient Prescriptions:  .  acetaminophen (TYLENOL) 325 MG tablet, Take 1-2 tablets (325-650 mg total) by mouth every 4 (four) hours as needed for mild pain., Disp: , Rfl:  .  allopurinol (ZYLOPRIM) 300 MG tablet, Take 300 mg by mouth daily at 12 noon. , Disp: , Rfl:  .  amoxicillin (AMOXIL) 500 MG capsule, Take 2,000 mg by mouth See admin instructions. Take 4 capsules (2000 mg) by mouth 1 hour prior to dental procedures., Disp: , Rfl:  .  aspirin EC 81 MG EC tablet, Take 1 tablet (81 mg total) by mouth daily., Disp: , Rfl:  .  atorvastatin (LIPITOR) 10 MG tablet, Take 10 mg by mouth daily at 12 noon. , Disp: , Rfl:  .  carvedilol (COREG) 6.25 MG tablet, TAKE 1 TABLET BY MOUTH TWO  TIMES DAILY (Patient taking differently: TAKE 1 TABLET (6.25 MG) BY MOUTH TWO  TIMES DAILY WITH BREAKFAST & WITH SUPPER), Disp: 180 tablet, Rfl: 3 .  cholecalciferol (VITAMIN D) 1000 units tablet, Take 5,000 Units by mouth daily with breakfast. , Disp: , Rfl:  .  Coenzyme Q10 50 MG CAPS, Take 50 mg by mouth daily with breakfast., Disp: , Rfl:  .  Cyanocobalamin (VITAMIN B-12) 5000 MCG SUBL, Place 5,000 mcg under the tongue 2 (two) times a week. Wednesday & Saturday., Disp: , Rfl:  .  ezetimibe (ZETIA) 10 MG tablet, Take 5 mg by mouth daily. In the morning., Disp: , Rfl:  .  fish oil-omega-3 fatty acids 1000 MG  capsule, Take 1 g by mouth 2 (two) times daily. , Disp: , Rfl:  .  Magnesium 500 MG CAPS, Take 500 mg by mouth every evening. , Disp: , Rfl:  .  OVER THE COUNTER MEDICATION, Take 1 capsule by mouth 2 (two) times daily. AYER-TRIPHALA , Disp: , Rfl:  .  telmisartan (MICARDIS) 80 MG tablet, Take 80 mg by mouth daily. In the morning., Disp: , Rfl: 11  Past Medical History: Past Medical History:  Diagnosis Date  . Aortic valve disease   . Arthritis   . 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 Colon polyp   . Gout   . Heart murmur    as a younger person,   . Hyperlipidemia   . Hypertension   . 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 S/P aortic valve replacement with metallic valve 38/93/7342   Due to congenital aortic stenosis // [name] mechanical valve done in 1982 // Echo 09/10/16: Mild focal basal septal hypertrophy, [name], [name], [name], mod aortic root enlargement, aortic root 36 mm, ascending aorta 49 mm,  [name], [name]    Tobacco Use: History  Smoking Status  . Never Smoker  Smokeless Tobacco  . Never Used    Labs: Recent Review Flowsheet Data    Labs for ITP Cardiac and Pulmonary Rehab Latest Ref Rng & Units 11/21/2016 11/21/2016 11/22/2016 11/22/2016 11/22/2016   Hemoglobin A1c 4.8 - 5.6 % - - - - -   PHART 7.350 - 7.450 - 7.503(H) 7.418 7.422 -   PCO2ART 32.0 - 48.0 mmHg - 28.9(L) 34.3 35.2 -   HCO3 20.0 - 28.0 mmol/L - 22.7 22.0 22.8 -   TCO2 0 - 100 mmol/L _0 ACIDBASEDEF 0.0 - 2.0 mmol/L - - 2.0 1.0 -   O2SAT % - 98.0 94.0 94.0 -      Capillary Blood Glucose: Lab Results  Component Value Date   GLUCAP 97 11/27/2016   GLUCAP 120 (H) 11/26/2016   GLUCAP 115 (H) 11/26/2016   GLUCAP 122 (H)  11/26/2016   GLUCAP 102 (H) 11/26/2016     Exercise Target Goals:    Exercise Program Goal: Individual exercise prescription set with THRR, safety & activity barriers. Participant demonstrates ability to understand and report RPE using BORG scale, to self-measure pulse accurately, and to acknowledge the importance of the exercise prescription.  Exercise Prescription Goal: Starting with aerobic activity 30 plus minutes a day, 3 days per week for initial exercise prescription. Provide home exercise prescription and guidelines that participant acknowledges understanding prior to discharge.  Activity Barriers & Risk Stratification:     Activity Barriers & Cardiac Risk Stratification - 01/10/17 0851      Activity Barriers & Cardiac Risk Stratification   Activity Barriers Arthritis;Joint Problems;Other (comment)   Comments R RTC issue ( no surgery) chronic R ankle stiffness/pain   Cardiac Risk Stratification High      6 Minute Walk:     6 Minute Walk    Row Name 01/10/17 1153         6 Minute Walk   Phase Initial     Distance 1478 feet     Walk Time 6 minutes     # of Rest Breaks 0     MPH 2.8     METS 3.21     RPE 11     VO2 Peak 11.24     Symptoms No     Resting HR 90 bpm     Resting BP 110/50     Resting Oxygen Saturation  98 %     Exercise Oxygen Saturation  during 6 min walk 100 %     Max Ex. HR 108 bpm     Max Ex. BP 118/58     2 Minute Post BP 90/50  given banana/drinking water recheck 98/60        Oxygen Initial Assessment:   Oxygen Re-Evaluation:   Oxygen Discharge (Final Oxygen Re-Evaluation):   Initial Exercise Prescription:     Initial Exercise Prescription - 01/10/17 1100      Date of Initial Exercise RX and Referring Provider   Date 01/10/17   Referring Provider Nahser, Arnette Norris MD     Bike   Level 0.7   Minutes 10   METs 2.76     NuStep   Level 3   SPM 80   Minutes 10   METs 2.5     Track   Laps 10   Minutes 10   METs 2.74      Prescription Details  Frequency (times per week) 3   Duration Progress to 30 minutes of continuous aerobic without signs/symptoms of physical distress     Intensity   THRR 40-80% of Max Heartrate 58-116   Ratings of Perceived Exertion 11-13   Perceived Dyspnea 0-4     Progression   Progression Continue to progress workloads to maintain intensity without signs/symptoms of physical distress.     Resistance Training   Training Prescription Yes   Weight 3lbs   Reps 10-15      Perform Capillary Blood Glucose checks as needed.  Exercise Prescription Changes:     Exercise Prescription Changes    Row Name 01/21/17 1700 02/04/17 1500           Response to Exercise   Blood Pressure (Admit) 118/78 132/80      Blood Pressure (Exercise) 122/78 138/76      Blood Pressure (Exit) 112/62 112/72      Heart Rate (Admit) 90 bpm 85 bpm      Heart Rate (Exercise) 120 bpm 121 bpm      Heart Rate (Exit) 88 bpm 85 bpm      Rating of Perceived Exertion (Exercise) 11 11      Symptoms none none      Comments Off to a good start with exercise.  -      Duration Progress to 30 minutes of  aerobic without signs/symptoms of physical distress Progress to 30 minutes of  aerobic without signs/symptoms of physical distress      Intensity THRR unchanged THRR unchanged        Progression   Progression Continue to progress workloads to maintain intensity without signs/symptoms of physical distress. Continue to progress workloads to maintain intensity without signs/symptoms of physical distress.      Average METs 2.9 3.6        Resistance Training   Training Prescription Yes Yes      Weight 3lbs 3lbs      Reps 10-15 10-15      Time 10 Minutes 10 Minutes        Interval Training   Interval Training No No        Bike   Level 0.7 1.2      Minutes 10 10      METs 2.75 3.94        NuStep   Level 5 6      SPM 80 80      Minutes 10 10      METs 2.3 3        Track   Laps 16 17      Minutes  10 10      METs 3.79 3.95        Home Exercise Plan   Plans to continue exercise at Home (comment) Home (comment)      Frequency Add 4 additional days to program exercise sessions. Add 4 additional days to program exercise sessions.      Initial Home Exercises Provided 01/21/17 01/21/17         Exercise Comments:     Exercise Comments    Row Name 01/21/17 1013 02/06/17 1102         Exercise Comments Reviewed home exercise goals with patient. Reviewed METs and goals with patient.         Exercise Goals and Review:     Exercise Goals    Row Name 01/10/17 [phone]  Exercise Goals   Increase Physical Activity Yes  return to yardwork       Intervention Provide advice, education, support and counseling about physical activity/exercise needs.;Develop an individualized exercise prescription for aerobic and resistive training based on initial evaluation findings, risk stratification, comorbidities and participant's personal goals.       Expected Outcomes Achievement of increased cardiorespiratory fitness and enhanced flexibility, muscular endurance and strength shown through measurements of functional capacity and personal statement of participant.       Increase Strength and Stamina Yes  return to playing tennis and return to gym like routine       Intervention Provide advice, education, support and counseling about physical activity/exercise needs.;Develop an individualized exercise prescription for aerobic and resistive training based on initial evaluation findings, risk stratification, comorbidities and participant's personal goals.       Expected Outcomes Achievement of increased cardiorespiratory fitness and enhanced flexibility, muscular endurance and strength shown through measurements of functional capacity and personal statement of participant.       Able to understand and use rate of perceived exertion (RPE) scale Yes       Intervention Provide education and  explanation on how to use RPE scale       Expected Outcomes Short Term: Able to use RPE daily in rehab to express subjective intensity level;Long Term:  Able to use RPE to guide intensity level when exercising independently       Knowledge and understanding of Target Heart Rate Range (THRR) Yes       Intervention Provide education and explanation of THRR including how the numbers were predicted and where they are located for reference       Expected Outcomes Short Term: Able to state/look up THRR;Long Term: Able to use THRR to govern intensity when exercising independently;Short Term: Able to use daily as guideline for intensity in rehab       Able to check pulse independently Yes       Intervention Provide education and demonstration on how to check pulse in carotid and radial arteries.;Review the importance of being able to check your own pulse for safety during independent exercise       Expected Outcomes Short Term: Able to explain why pulse checking is important during independent exercise;Long Term: Able to check pulse independently and accurately       Understanding of Exercise Prescription Yes       Intervention Provide education, explanation, and written materials on patient's individual exercise prescription       Expected Outcomes Short Term: Able to explain program exercise prescription;Long Term: Able to explain home exercise prescription to exercise independently          Exercise Goals Re-Evaluation :     Exercise Goals Re-Evaluation    Row Name 01/21/17 1708 02/06/17 1103           Exercise Goal Re-Evaluation   Exercise Goals Review Knowledge and understanding of Target Heart Rate Range (THRR);Understanding of Exercise Prescription;Able to understand and use rate of perceived exertion (RPE) scale Increase Physical Activity      Comments Reviewed home exericse prescription with patient including THRR and RPE scale. Patient is doing well with exercise. Pt has been cleared by  physician to resume senior chair yoga.      Expected Outcomes Continue walking 30 minutes, 3 days/week in addition to cardiac rehab. Add senior chair yoga when cleared by physician. Continue walking 30 minutes and/or participating in chair yoga 45 minutes, 3 days/week  in addition to CR.          Discharge Exercise Prescription (Final Exercise Prescription Changes):     Exercise Prescription Changes - 02/04/17 1500      Response to Exercise   Blood Pressure (Admit) 132/80   Blood Pressure (Exercise) 138/76   Blood Pressure (Exit) 112/72   Heart Rate (Admit) 85 bpm   Heart Rate (Exercise) 121 bpm   Heart Rate (Exit) 85 bpm   Rating of Perceived Exertion (Exercise) 11   Symptoms none   Duration Progress to 30 minutes of  aerobic without signs/symptoms of physical distress   Intensity THRR unchanged     Progression   Progression Continue to progress workloads to maintain intensity without signs/symptoms of physical distress.   Average METs 3.6     Resistance Training   Training Prescription Yes   Weight 3lbs   Reps 10-15   Time 10 Minutes     Interval Training   Interval Training No     Bike   Level 1.2   Minutes 10   METs 3.94     NuStep   Level 6   SPM 80   Minutes 10   METs 3     Track   Laps 17   Minutes 10   METs 3.95     Home Exercise Plan   Plans to continue exercise at Home (comment)   Frequency Add 4 additional days to program exercise sessions.   Initial Home Exercises Provided 01/21/17      Nutrition:  Target Goals: Understanding of nutrition guidelines, daily intake of sodium <1526m, cholesterol <2031m calories 30% from fat and 7% or less from saturated fats, daily to have 5 or more servings of fruits and vegetables.  Biometrics:     Pre Biometrics - 01/10/17 1204      Pre Biometrics   Waist Circumference 37 inches   Hip Circumference 35.75 inches   Waist to Hip Ratio 1.03 %   Triceps Skinfold 15 mm   % Body Fat 24.9 %   Grip Strength  40 kg   Flexibility 0 in   Single Leg Stand 24 seconds       Nutrition Therapy Plan and Nutrition Goals:     Nutrition Therapy & Goals - 01/10/17 1234      Nutrition Therapy   Diet Therapeutic Lifestyle Changes   Drug/Food Interactions Coumadin/Vit K     Personal Nutrition Goals   Nutrition Goal Pt to describe the benefit of including fruits, vegetables, whole grains, and low-fat dairy products in a heart healthy meal plan.     Intervention Plan   Intervention Prescribe, educate and counsel regarding individualized specific dietary modifications aiming towards targeted core components such as weight, hypertension, lipid management, diabetes, heart failure and other comorbidities.   Expected Outcomes Short Term Goal: Understand basic principles of dietary content, such as calories, fat, sodium, cholesterol and nutrients.;Long Term Goal: Adherence to prescribed nutrition plan.      Nutrition Discharge: Nutrition Scores:     Nutrition Assessments - 01/10/17 1229      MEDFICTS Scores   Pre Score 33      Nutrition Goals Re-Evaluation:   Nutrition Goals Re-Evaluation:   Nutrition Goals Discharge (Final Nutrition Goals Re-Evaluation):   Psychosocial: Target Goals: Acknowledge presence or absence of significant depression and/or stress, maximize coping skills, provide positive support system. Participant is able to verbalize types and ability to use techniques and skills needed for reducing stress and depression.  Initial Review & Psychosocial Screening:     Initial Psych Review & Screening - 01/10/17 1329      Initial Review   Current issues with None Identified     Family Dynamics   Good Support System? Yes     Barriers   Psychosocial barriers to participate in program The patient should benefit from training in stress management and relaxation.     Screening Interventions   Interventions Encouraged to exercise      Quality of Life Scores:     Quality of  Life - 01/10/17 1147      Quality of Life Scores   Health/Function Pre 25.5 %   Socioeconomic Pre 27.86 %   Psych/Spiritual Pre 26.79 %   Family Pre 30 %   GLOBAL Pre 26.91 %      PHQ-9: Recent Review Flowsheet Data    Depression screen [facility] 2/9 01/14/2017 08/19/2015 01/12/2015   Decreased Interest 0 0 0   Down, Depressed, Hopeless 0 0 0   PHQ - 2 Score 0 0 0     Interpretation of Total Score  Total Score Depression Severity:  1-4 = Minimal depression, 5-9 = Mild depression, 10-14 = Moderate depression, 15-19 = Moderately severe depression, 20-27 = Severe depression   Psychosocial Evaluation and Intervention:   Psychosocial Re-Evaluation:     Psychosocial Re-Evaluation    [place] Name 02/06/17 1203             Psychosocial Re-Evaluation   Current issues with None Identified       Interventions Encouraged to attend Cardiac Rehabilitation for the exercise       Continue Psychosocial Services  No Follow up required          Psychosocial Discharge (Final Psychosocial Re-Evaluation):     Psychosocial Re-Evaluation - 02/06/17 1203      Psychosocial Re-Evaluation   Current issues with None Identified   Interventions Encouraged to attend Cardiac Rehabilitation for the exercise   Continue Psychosocial Services  No Follow up required      Vocational Rehabilitation: Provide vocational rehab assistance to qualifying candidates.   Vocational Rehab Evaluation & Intervention:     Vocational Rehab - 01/10/17 1405      Initial Vocational Rehab Evaluation & Intervention   Assessment shows need for Vocational Rehabilitation No      Education: Education Goals: Education classes will be provided on a weekly basis, covering required topics. Participant will state understanding/return demonstration of topics presented.  Learning Barriers/Preferences:     Learning Barriers/Preferences - 01/10/17 0851      Learning Barriers/Preferences   Learning Barriers Sight   Learning  Preferences Written Material      Education Topics: Count Your Pulse:  -Group instruction provided by verbal instruction, demonstration, patient participation and written materials to support subject.  Instructors address importance of being able to find your pulse and how to count your pulse when at home without a heart monitor.  Patients get hands on experience counting their pulse with staff help and individually.   Heart Attack, Angina, and Risk Factor Modification:  -Group instruction provided by verbal instruction, video, and written materials to support subject.  Instructors address signs and symptoms of angina and heart attacks.    Also discuss risk factors for heart disease and how to make changes to improve heart health risk factors.   Functional Fitness:  -Group instruction provided by verbal instruction, demonstration, patient participation, and written materials to support subject.  Instructors address safety  measures for doing things around the house.  Discuss how to get up and down off the floor, how to pick things up properly, how to safely get out of a chair without assistance, and balance training.   Meditation and Mindfulness:  -Group instruction provided by verbal instruction, patient participation, and written materials to support subject.  Instructor addresses importance of mindfulness and meditation practice to help reduce stress and improve awareness.  Instructor also leads participants through a meditation exercise.    Stretching for Flexibility and Mobility:  -Group instruction provided by verbal instruction, patient participation, and written materials to support subject.  Instructors lead participants through series of stretches that are designed to increase flexibility thus improving mobility.  These stretches are additional exercise for major muscle groups that are typically performed during regular warm up and cool down.   CARDIAC REHAB PHASE II EXERCISE from  02/01/2017 in [place]  Date  02/01/17  Instruction Review Code  2- meets goals/outcomes      Hands Only CPR:  -Group verbal, video, and participation provides a basic overview of AHA guidelines for community CPR. Role-play of emergencies allow participants the opportunity to practice calling for help and chest compression technique with discussion of AED use.   Hypertension: -Group verbal and written instruction that provides a basic overview of hypertension including the most recent diagnostic guidelines, risk factor reduction with self-care instructions and medication management.    Nutrition I class: Heart Healthy Eating:  -Group instruction provided by PowerPoint slides, verbal discussion, and written materials to support subject matter. The instructor gives an explanation and review of the Therapeutic Lifestyle Changes diet recommendations, which includes a discussion on lipid goals, dietary fat, sodium, fiber, plant stanol/sterol esters, sugar, and the components of a well-balanced, healthy diet.   Nutrition II class: Lifestyle Skills:  -Group instruction provided by PowerPoint slides, verbal discussion, and written materials to support subject matter. The instructor gives an explanation and review of label reading, grocery shopping for heart health, heart healthy recipe modifications, and ways to make healthier choices when eating out.   Diabetes Question & Answer:  -Group instruction provided by PowerPoint slides, verbal discussion, and written materials to support subject matter. The instructor gives an explanation and review of diabetes co-morbidities, pre- and post-prandial blood glucose goals, pre-exercise blood glucose goals, signs, symptoms, and treatment of hypoglycemia and hyperglycemia, and foot care basics.   Diabetes Blitz:  -Group instruction provided by PowerPoint slides, verbal discussion, and written materials to support subject  matter. The instructor gives an explanation and review of the physiology behind type 1 and type 2 diabetes, diabetes medications and rational behind using different medications, pre- and post-prandial blood glucose recommendations and Hemoglobin A1c goals, diabetes diet, and exercise including blood glucose guidelines for exercising safely.    Portion Distortion:  -Group instruction provided by PowerPoint slides, verbal discussion, written materials, and food models to support subject matter. The instructor gives an explanation of serving size versus portion size, changes in portions sizes over the last 20 years, and what consists of a serving from each food group.   Stress Management:  -Group instruction provided by verbal instruction, video, and written materials to support subject matter.  Instructors review role of stress in heart disease and how to cope with stress positively.     Exercising on Your Own:  -Group instruction provided by verbal instruction, power point, and written materials to support subject.  Instructors discuss benefits of exercise, components of  exercise, frequency and intensity of exercise, and end points for exercise.  Also discuss use of nitroglycerin and activating EMS.  Review options of places to exercise outside of rehab.  Review guidelines for sex with heart disease.   Cardiac Drugs I:  -Group instruction provided by verbal instruction and written materials to support subject.  Instructor reviews cardiac drug classes: antiplatelets, anticoagulants, beta blockers, and statins.  Instructor discusses reasons, side effects, and lifestyle considerations for each drug class.   Cardiac Drugs II:  -Group instruction provided by verbal instruction and written materials to support subject.  Instructor reviews cardiac drug classes: angiotensin converting enzyme inhibitors (ACE-I), angiotensin II receptor blockers (ARBs), nitrates, and calcium channel blockers.  Instructor  discusses reasons, side effects, and lifestyle considerations for each drug class.   Anatomy and Physiology of the Circulatory System:  Group verbal and written instruction and models provide basic cardiac anatomy and physiology, with the coronary electrical and arterial systems. Review of: AMI, Angina, Valve disease, Heart Failure, Peripheral Artery Disease, Cardiac Arrhythmia, Pacemakers, and the ICD.   Other Education:  -Group or individual verbal, written, or video instructions that support the educational goals of the cardiac rehab program.   Knowledge Questionnaire Score:     Knowledge Questionnaire Score - 01/10/17 1144      Knowledge Questionnaire Score   Pre Score 23/24      Core Components/Risk Factors/Patient Goals at Admission:     Personal Goals and Risk Factors at Admission - 01/10/17 1204      Core Components/Risk Factors/Patient Goals on Admission   Hypertension Yes   Intervention Provide education on lifestyle modifcations including regular physical activity/exercise, weight management, moderate sodium restriction and increased consumption of fresh fruit, vegetables, and low fat dairy, alcohol moderation, and smoking cessation.;Monitor prescription use compliance.   Expected Outcomes Short Term: Continued assessment and intervention until BP is < 140/71m HG in hypertensive participants. < 130/852mHG in hypertensive participants with diabetes, heart failure or chronic kidney disease.;Long Term: Maintenance of blood pressure at goal levels.   Lipids Yes   Intervention Provide education and support for participant on nutrition & aerobic/resistive exercise along with prescribed medications to achieve LDL <7075mHDL >71m53m Expected Outcomes Short Term: Participant states understanding of desired cholesterol values and is compliant with medications prescribed. Participant is following exercise prescription and nutrition guidelines.;Long Term: Cholesterol controlled with  medications as prescribed, with individualized exercise RX and with personalized nutrition plan. Value goals: LDL < 70mg73mL > 40 mg.      Core Components/Risk Factors/Patient Goals Review:      Goals and Risk Factor Review    Row Name 02/06/17 1156             Core Components/Risk Factors/Patient Goals Review   Personal Goals Review Lipids;Hypertension       Review Barry's vital signs have been stable at cardiac rehab       Expected Outcomes Dakota Martin continue to exercise at cardiac rehab and take his medicaitons as presribed          Core Components/Risk Factors/Patient Goals at Discharge (Final Review):      Goals and Risk Factor Review - 02/06/17 1156      Core Components/Risk Factors/Patient Goals Review   Personal Goals Review Lipids;Hypertension   Review Barry's vital signs have been stable at cardiac rehab   Expected Outcomes Dakota Martin continue to exercise at cardiac rehab and take his medicaitons as presribed      ITP  Comments:     ITP Comments    Row Name 01/10/17 0848           ITP Comments Medical Director, Dr. Fransico Him          Comments: Dakota Martin  is making expected progress toward personal goals after completing 12 sessions. Recommend continued exercise and life style modification education including  stress management and relaxation techniques to decrease cardiac risk profile. Dakota Martin is doing well with exercise.Barnet Pall, RN,BSN 02/06/2017 12:12 PM

## 2017-02-08 ENCOUNTER — Encounter (HOSPITAL_COMMUNITY)
Admission: RE | Admit: 2017-02-08 | Discharge: 2017-02-08 | Disposition: A | Discharge status: Home / Self Care | Payer: Medicare Other | Source: Ambulatory Visit | Attending: Cardiovascular Disease | Admitting: Cardiovascular Disease

## 2017-02-08 DIAGNOSIS — Z951 Presence of aortocoronary bypass graft: Secondary | ICD-10-CM | POA: Diagnosis not present

## 2017-02-08 DIAGNOSIS — Z952 Presence of prosthetic heart valve: Secondary | ICD-10-CM

## 2017-02-08 DIAGNOSIS — Z950 Presence of cardiac pacemaker: Secondary | ICD-10-CM

## 2017-02-11 ENCOUNTER — Encounter (HOSPITAL_COMMUNITY)
Admission: RE | Admit: 2017-02-11 | Discharge: 2017-02-11 | Disposition: A | Discharge status: Home / Self Care | Payer: Medicare Other | Source: Ambulatory Visit | Attending: Cardiovascular Disease | Admitting: Cardiovascular Disease

## 2017-02-11 DIAGNOSIS — Z951 Presence of aortocoronary bypass graft: Secondary | ICD-10-CM

## 2017-02-11 DIAGNOSIS — Z950 Presence of cardiac pacemaker: Secondary | ICD-10-CM

## 2017-02-11 DIAGNOSIS — Z952 Presence of prosthetic heart valve: Secondary | ICD-10-CM

## 2017-02-13 ENCOUNTER — Encounter (HOSPITAL_COMMUNITY)
Admission: RE | Admit: 2017-02-13 | Discharge: 2017-02-13 | Disposition: A | Discharge status: Home / Self Care | Payer: Medicare Other | Source: Ambulatory Visit | Attending: Cardiovascular Disease | Admitting: Cardiovascular Disease

## 2017-02-13 DIAGNOSIS — Z951 Presence of aortocoronary bypass graft: Secondary | ICD-10-CM | POA: Diagnosis not present

## 2017-02-13 DIAGNOSIS — Z952 Presence of prosthetic heart valve: Secondary | ICD-10-CM

## 2017-02-15 ENCOUNTER — Encounter (HOSPITAL_COMMUNITY)
Admission: RE | Admit: 2017-02-15 | Discharge: 2017-02-15 | Disposition: A | Discharge status: Home / Self Care | Payer: Medicare Other | Source: Ambulatory Visit | Attending: Cardiovascular Disease | Admitting: Cardiovascular Disease

## 2017-02-15 DIAGNOSIS — Z952 Presence of prosthetic heart valve: Secondary | ICD-10-CM

## 2017-02-15 DIAGNOSIS — Z951 Presence of aortocoronary bypass graft: Secondary | ICD-10-CM

## 2017-02-18 ENCOUNTER — Encounter (HOSPITAL_COMMUNITY)
Admission: RE | Admit: 2017-02-18 | Discharge: 2017-02-18 | Disposition: A | Discharge status: Home / Self Care | Payer: Medicare Other | Source: Ambulatory Visit | Attending: Cardiovascular Disease | Admitting: Cardiovascular Disease

## 2017-02-18 DIAGNOSIS — Z951 Presence of aortocoronary bypass graft: Secondary | ICD-10-CM | POA: Diagnosis not present

## 2017-02-18 DIAGNOSIS — Z952 Presence of prosthetic heart valve: Secondary | ICD-10-CM

## 2017-02-20 ENCOUNTER — Encounter (HOSPITAL_COMMUNITY)
Admission: RE | Admit: 2017-02-20 | Discharge: 2017-02-20 | Disposition: A | Discharge status: Home / Self Care | Payer: Medicare Other | Source: Ambulatory Visit | Attending: Cardiovascular Disease | Admitting: Cardiovascular Disease

## 2017-02-20 DIAGNOSIS — Z951 Presence of aortocoronary bypass graft: Secondary | ICD-10-CM

## 2017-02-20 DIAGNOSIS — Z952 Presence of prosthetic heart valve: Secondary | ICD-10-CM

## 2017-02-20 DIAGNOSIS — Z950 Presence of cardiac pacemaker: Secondary | ICD-10-CM

## 2017-02-22 ENCOUNTER — Encounter (HOSPITAL_COMMUNITY)
Admission: RE | Admit: 2017-02-22 | Discharge: 2017-02-22 | Disposition: A | Discharge status: Home / Self Care | Payer: Medicare Other | Source: Ambulatory Visit | Attending: Cardiovascular Disease | Admitting: Cardiovascular Disease

## 2017-02-22 DIAGNOSIS — Z951 Presence of aortocoronary bypass graft: Secondary | ICD-10-CM

## 2017-02-22 DIAGNOSIS — Z952 Presence of prosthetic heart valve: Secondary | ICD-10-CM

## 2017-02-25 ENCOUNTER — Encounter (HOSPITAL_COMMUNITY)
Admission: RE | Admit: 2017-02-25 | Discharge: 2017-02-25 | Disposition: A | Discharge status: Home / Self Care | Payer: Medicare Other | Source: Ambulatory Visit | Attending: Cardiovascular Disease | Admitting: Cardiovascular Disease

## 2017-02-25 DIAGNOSIS — Z951 Presence of aortocoronary bypass graft: Secondary | ICD-10-CM

## 2017-02-25 DIAGNOSIS — Z950 Presence of cardiac pacemaker: Secondary | ICD-10-CM

## 2017-02-25 DIAGNOSIS — Z952 Presence of prosthetic heart valve: Secondary | ICD-10-CM

## 2017-02-27 ENCOUNTER — Encounter (HOSPITAL_COMMUNITY)
Admission: RE | Admit: 2017-02-27 | Discharge: 2017-02-27 | Disposition: A | Discharge status: Home / Self Care | Payer: Medicare Other | Source: Ambulatory Visit | Attending: Cardiovascular Disease | Admitting: Cardiovascular Disease

## 2017-02-27 ENCOUNTER — Encounter: Payer: Medicare Other | Admitting: Internal Medicine

## 2017-02-27 DIAGNOSIS — Z951 Presence of aortocoronary bypass graft: Secondary | ICD-10-CM | POA: Diagnosis not present

## 2017-02-27 DIAGNOSIS — Z952 Presence of prosthetic heart valve: Secondary | ICD-10-CM

## 2017-03-01 ENCOUNTER — Encounter (HOSPITAL_COMMUNITY)
Admission: RE | Admit: 2017-03-01 | Discharge: 2017-03-01 | Disposition: A | Discharge status: Home / Self Care | Payer: Medicare Other | Source: Ambulatory Visit | Attending: Cardiovascular Disease | Admitting: Cardiovascular Disease

## 2017-03-01 DIAGNOSIS — M109 Gout, unspecified: Secondary | ICD-10-CM | POA: Insufficient documentation

## 2017-03-01 DIAGNOSIS — I10 Essential (primary) hypertension: Secondary | ICD-10-CM | POA: Diagnosis not present

## 2017-03-01 DIAGNOSIS — Z950 Presence of cardiac pacemaker: Secondary | ICD-10-CM | POA: Diagnosis not present

## 2017-03-01 DIAGNOSIS — E785 Hyperlipidemia, unspecified: Secondary | ICD-10-CM | POA: Diagnosis not present

## 2017-03-01 DIAGNOSIS — R011 Cardiac murmur, unspecified: Secondary | ICD-10-CM | POA: Diagnosis not present

## 2017-03-01 DIAGNOSIS — Z951 Presence of aortocoronary bypass graft: Secondary | ICD-10-CM | POA: Insufficient documentation

## 2017-03-01 DIAGNOSIS — Z952 Presence of prosthetic heart valve: Secondary | ICD-10-CM | POA: Diagnosis not present

## 2017-03-01 NOTE — Progress Notes (Signed)
Cardiac Individual Treatment Plan  Patient Details  Name: Dakota Martin MRN: [id number] Date of Birth: Feb 17, 1941 Referring Provider:     [place] from 01/10/2017 in [place]  Referring Provider  Nahser, Philip MD      Initial Encounter Date:    CARDIAC REHAB PHASE II ORIENTATION from 01/10/2017 in [place]  Date  01/10/17  Referring Provider  Nahser, Arnette Norris MD      Visit Diagnosis: S/P CABG x 2  S/P AVR (aortic valve replacement)  Patient's Home Medications on Admission:  Current Outpatient Prescriptions:  .  acetaminophen (TYLENOL) 325 MG tablet, Take 1-2 tablets (325-650 mg total) by mouth every 4 (four) hours as needed for mild pain., Disp: , Rfl:  .  allopurinol (ZYLOPRIM) 300 MG tablet, Take 300 mg by mouth daily at 12 noon. , Disp: , Rfl:  .  amoxicillin (AMOXIL) 500 MG capsule, Take 2,000 mg by mouth See admin instructions. Take 4 capsules (2000 mg) by mouth 1 hour prior to dental procedures., Disp: , Rfl:  .  aspirin EC 81 MG EC tablet, Take 1 tablet (81 mg total) by mouth daily., Disp: , Rfl:  .  atorvastatin (LIPITOR) 10 MG tablet, Take 10 mg by mouth daily at 12 noon. , Disp: , Rfl:  .  carvedilol (COREG) 6.25 MG tablet, TAKE 1 TABLET BY MOUTH TWO  TIMES DAILY (Patient taking differently: TAKE 1 TABLET (6.25 MG) BY MOUTH TWO  TIMES DAILY WITH BREAKFAST & WITH SUPPER), Disp: 180 tablet, Rfl: 3 .  cholecalciferol (VITAMIN D) 1000 units tablet, Take 5,000 Units by mouth daily with breakfast. , Disp: , Rfl:  .  Coenzyme Q10 50 MG CAPS, Take 50 mg by mouth daily with breakfast., Disp: , Rfl:  .  Cyanocobalamin (VITAMIN B-12) 5000 MCG SUBL, Place 5,000 mcg under the tongue 2 (two) times a week. Wednesday & Saturday., Disp: , Rfl:  .  ezetimibe (ZETIA) 10 MG tablet, Take 5 mg by mouth daily. In the morning., Disp: , Rfl:  .  fish oil-omega-3 fatty acids 1000 MG capsule, Take 1  g by mouth 2 (two) times daily. , Disp: , Rfl:  .  Magnesium 500 MG CAPS, Take 500 mg by mouth every evening. , Disp: , Rfl:  .  OVER THE COUNTER MEDICATION, Take 1 capsule by mouth 2 (two) times daily. AYER-TRIPHALA , Disp: , Rfl:  .  telmisartan (MICARDIS) 80 MG tablet, Take 80 mg by mouth daily. In the morning., Disp: , Rfl: 11  Past Medical History: Past Medical History:  Diagnosis Date  . Aortic valve disease   . Arthritis   . 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 Colon polyp   . Gout   . Heart murmur    as a younger person,   . Hyperlipidemia   . Hypertension   . 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 S/P aortic valve replacement with metallic valve 42/59/5638   Due to congenital aortic stenosis // [name] mechanical valve done in 1982 // Echo 09/10/16: Mild focal basal septal hypertrophy, [name], [name], [name], mod aortic root enlargement, aortic root 36 mm, ascending aorta 49 mm, [name], PASP  37    Tobacco Use: History  Smoking Status  . Never Smoker  Smokeless Tobacco  . Never Used    Labs: Recent Review Flowsheet Data    Labs for ITP Cardiac and Pulmonary Rehab Latest Ref Rng & Units 11/21/2016 11/21/2016 11/22/2016 11/22/2016 11/22/2016   Hemoglobin A1c 4.8 - 5.6 % - - - - -   PHART 7.350 - 7.450 - 7.503(H) 7.418 7.422 -   PCO2ART 32.0 - 48.0 mmHg - 28.9(L) 34.3 35.2 -   HCO3 20.0 - 28.0 mmol/L - 22.7 22.0 22.8 -   TCO2 0 - 100 mmol/L 21 24 23 24 22    ACIDBASEDEF 0.0 - 2.0 mmol/L - - 2.0 1.0 -   O2SAT % - 98.0 94.0 94.0 -      Capillary Blood Glucose: Lab Results  Component Value Date   GLUCAP 97 11/27/2016   GLUCAP 120 (H) 11/26/2016   GLUCAP 115 (H) 11/26/2016   GLUCAP 122 (H) 11/26/2016   GLUCAP  102 (H) 11/26/2016     Exercise Target Goals:    Exercise Program Goal: Individual exercise prescription set with THRR, safety & activity barriers. Participant demonstrates ability to understand and report RPE using BORG scale, to self-measure pulse accurately, and to acknowledge the importance of the exercise prescription.  Exercise Prescription Goal: Starting with aerobic activity 30 plus minutes a day, 3 days per week for initial exercise prescription. Provide home exercise prescription and guidelines that participant acknowledges understanding prior to discharge.  Activity Barriers & Risk Stratification:     Activity Barriers & Cardiac Risk Stratification - 01/10/17 0851      Activity Barriers & Cardiac Risk Stratification   Activity Barriers Arthritis;Joint Problems;Other (comment)   Comments R RTC issue ( no surgery) chronic R ankle stiffness/pain   Cardiac Risk Stratification High      6 Minute Walk:     6 Minute Walk    Row Name 01/10/17 1153         6 Minute Walk   Phase Initial     Distance 1478 feet     Walk Time 6 minutes     # of Rest Breaks 0     MPH 2.8     METS 3.21     RPE 11     VO2 Peak 11.24     Symptoms No     Resting HR 90 bpm     Resting BP 110/50     Resting Oxygen Saturation  98 %     Exercise Oxygen Saturation  during 6 min walk 100 %     Max Ex. HR 108 bpm     Max Ex. BP 118/58     2 Minute Post BP 90/50  given banana/drinking water recheck 98/60        Oxygen Initial Assessment:   Oxygen Re-Evaluation:   Oxygen Discharge (Final Oxygen Re-Evaluation):   Initial Exercise Prescription:     Initial Exercise Prescription - 01/10/17 1100      Date of Initial Exercise RX and Referring Provider   Date 01/10/17   Referring Provider Nahser, Arnette Norris MD     Bike   Level 0.7   Minutes 10   METs 2.76     NuStep   Level 3   SPM 80   Minutes 10   METs 2.5     Track   Laps 10   Minutes 10   METs 2.74     Prescription  Details   Frequency (times  per week) 3   Duration Progress to 30 minutes of continuous aerobic without signs/symptoms of physical distress     Intensity   THRR 40-80% of Max Heartrate 58-116   Ratings of Perceived Exertion 11-13   Perceived Dyspnea 0-4     Progression   Progression Continue to progress workloads to maintain intensity without signs/symptoms of physical distress.     Resistance Training   Training Prescription Yes   Weight 3lbs   Reps 10-15      Perform Capillary Blood Glucose checks as needed.  Exercise Prescription Changes:      Exercise Prescription Changes    Row Name 01/21/17 1700 02/04/17 1500 02/18/17 1200         Response to Exercise   Blood Pressure (Admit) 118/78 132/80 114/70     Blood Pressure (Exercise) 122/78 138/76 138/72     Blood Pressure (Exit) 112/62 112/72 120/70     Heart Rate (Admit) 90 bpm 85 bpm 90 bpm     Heart Rate (Exercise) 120 bpm 121 bpm 130 bpm     Heart Rate (Exit) 88 bpm 85 bpm 96 bpm     Rating of Perceived Exertion (Exercise) 11 11 12      Symptoms none none none     Comments Off to a good start with exercise.  -  -     Duration Progress to 30 minutes of  aerobic without signs/symptoms of physical distress Progress to 30 minutes of  aerobic without signs/symptoms of physical distress Progress to 30 minutes of  aerobic without signs/symptoms of physical distress     Intensity THRR unchanged THRR unchanged THRR unchanged       Progression   Progression Continue to progress workloads to maintain intensity without signs/symptoms of physical distress. Continue to progress workloads to maintain intensity without signs/symptoms of physical distress. Continue to progress workloads to maintain intensity without signs/symptoms of physical distress.     Average METs 2.9 3.6 4       Resistance Training   Training Prescription Yes Yes Yes     Weight 3lbs 3lbs 3lbs     Reps 10-15 10-15 10-15     Time 10 Minutes 10 Minutes 10  Minutes       Interval Training   Interval Training No No No       Bike   Level 0.7 1.2 1.4     Minutes 10 10 10      METs 2.75 3.94 4.48       NuStep   Level 5 6 6      SPM 80 80 80     Minutes 10 10 10      METs 2.3 3 3.5       Track   Laps 16 17 18      Minutes 10 10 10      METs 3.79 3.95 4.14       Home Exercise Plan   Plans to continue exercise at Home (comment) Home (comment) Home (comment)     Frequency Add 4 additional days to program exercise sessions. Add 4 additional days to program exercise sessions. Add 4 additional days to program exercise sessions.     Initial Home Exercises Provided 01/21/17 01/21/17 01/21/17        Exercise Comments:      Exercise Comments    Row Name 01/21/17 1013 02/06/17 1102 02/18/17 0945       Exercise Comments Reviewed home exercise goals with patient. Reviewed METs and goals with patient.  Reviewed METs and goals with patient.        Exercise Goals and Review:      Exercise Goals    Row Name 01/10/17 0854             Exercise Goals   Increase Physical Activity Yes  return to yardwork       Intervention Provide advice, education, support and counseling about physical activity/exercise needs.;Develop an individualized exercise prescription for aerobic and resistive training based on initial evaluation findings, risk stratification, comorbidities and participant's personal goals.       Expected Outcomes Achievement of increased cardiorespiratory fitness and enhanced flexibility, muscular endurance and strength shown through measurements of functional capacity and personal statement of participant.       Increase Strength and Stamina Yes  return to playing tennis and return to gym like routine       Intervention Provide advice, education, support and counseling about physical activity/exercise needs.;Develop an individualized exercise prescription for aerobic and resistive training based on initial evaluation findings, risk  stratification, comorbidities and participant's personal goals.       Expected Outcomes Achievement of increased cardiorespiratory fitness and enhanced flexibility, muscular endurance and strength shown through measurements of functional capacity and personal statement of participant.       Able to understand and use rate of perceived exertion (RPE) scale Yes       Intervention Provide education and explanation on how to use RPE scale       Expected Outcomes Short Term: Able to use RPE daily in rehab to express subjective intensity level;Long Term:  Able to use RPE to guide intensity level when exercising independently       Knowledge and understanding of Target Heart Rate Range (THRR) Yes       Intervention Provide education and explanation of THRR including how the numbers were predicted and where they are located for reference       Expected Outcomes Short Term: Able to state/look up THRR;Long Term: Able to use THRR to govern intensity when exercising independently;Short Term: Able to use daily as guideline for intensity in rehab       Able to check pulse independently Yes       Intervention Provide education and demonstration on how to check pulse in carotid and radial arteries.;Review the importance of being able to check your own pulse for safety during independent exercise       Expected Outcomes Short Term: Able to explain why pulse checking is important during independent exercise;Long Term: Able to check pulse independently and accurately       Understanding of Exercise Prescription Yes       Intervention Provide education, explanation, and written materials on patient's individual exercise prescription       Expected Outcomes Short Term: Able to explain program exercise prescription;Long Term: Able to explain home exercise prescription to exercise independently          Exercise Goals Re-Evaluation :     Exercise Goals Re-Evaluation    Row Name 01/21/17 1708 02/06/17 1103 02/18/17  0945         Exercise Goal Re-Evaluation   Exercise Goals Review Knowledge and understanding of Target Heart Rate Range (THRR);Understanding of Exercise Prescription;Able to understand and use rate of perceived exertion (RPE) scale Increase Physical Activity Increase Physical Activity;Knowledge and understanding of Target Heart Rate Range (THRR);Able to understand and use rate of perceived exertion (RPE) scale     Comments Reviewed home exericse prescription  with patient including THRR and RPE scale. Patient is doing well with exercise. Pt has been cleared by physician to resume senior chair yoga. Making very good progress with exercise. Patient is walking 30 minutes, 3 days/week. Patient knows THRR and is able to use the RPE scale appropriately.     Expected Outcomes Continue walking 30 minutes, 3 days/week in addition to cardiac rehab. Add senior chair yoga when cleared by physician. Continue walking 30 minutes and/or participating in chair yoga 45 minutes, 3 days/week in addition to CR. Continue exercise prescription, walking and participating in yoga in addition to cardiac rehab.         Discharge Exercise Prescription (Final Exercise Prescription Changes):     Exercise Prescription Changes - 02/18/17 1200      Response to Exercise   Blood Pressure (Admit) 114/70   Blood Pressure (Exercise) 138/72   Blood Pressure (Exit) 120/70   Heart Rate (Admit) 90 bpm   Heart Rate (Exercise) 130 bpm   Heart Rate (Exit) 96 bpm   Rating of Perceived Exertion (Exercise) 12   Symptoms none   Duration Progress to 30 minutes of  aerobic without signs/symptoms of physical distress   Intensity THRR unchanged     Progression   Progression Continue to progress workloads to maintain intensity without signs/symptoms of physical distress.   Average METs 4     Resistance Training   Training Prescription Yes   Weight 3lbs   Reps 10-15   Time 10 Minutes     Interval Training   Interval Training No      Bike   Level 1.4   Minutes 10   METs 4.48     NuStep   Level 6   SPM 80   Minutes 10   METs 3.5     Track   Laps 18   Minutes 10   METs 4.14     Home Exercise Plan   Plans to continue exercise at Home (comment)   Frequency Add 4 additional days to program exercise sessions.   Initial Home Exercises Provided 01/21/17      Nutrition:  Target Goals: Understanding of nutrition guidelines, daily intake of sodium <1551m, cholesterol <2079m calories 30% from fat and 7% or less from saturated fats, daily to have 5 or more servings of fruits and vegetables.  Biometrics:     Pre Biometrics - 01/10/17 1204      Pre Biometrics   Waist Circumference 37 inches   Hip Circumference 35.75 inches   Waist to Hip Ratio 1.03 %   Triceps Skinfold 15 mm   % Body Fat 24.9 %   Grip Strength 40 kg   Flexibility 0 in   Single Leg Stand 24 seconds       Nutrition Therapy Plan and Nutrition Goals:     Nutrition Therapy & Goals - 01/10/17 1234      Nutrition Therapy   Diet Therapeutic Lifestyle Changes   Drug/Food Interactions Coumadin/Vit K     Personal Nutrition Goals   Nutrition Goal Pt to describe the benefit of including fruits, vegetables, whole grains, and low-fat dairy products in a heart healthy meal plan.     Intervention Plan   Intervention Prescribe, educate and counsel regarding individualized specific dietary modifications aiming towards targeted core components such as weight, hypertension, lipid management, diabetes, heart failure and other comorbidities.   Expected Outcomes Short Term Goal: Understand basic principles of dietary content, such as calories, fat, sodium, cholesterol and nutrients.;Long Term  Goal: Adherence to prescribed nutrition plan.      Nutrition Discharge: Nutrition Scores:     Nutrition Assessments - 01/10/17 1229      MEDFICTS Scores   Pre Score 33      Nutrition Goals Re-Evaluation:   Nutrition Goals  Re-Evaluation:   Nutrition Goals Discharge (Final Nutrition Goals Re-Evaluation):   Psychosocial: Target Goals: Acknowledge presence or absence of significant depression and/or stress, maximize coping skills, provide positive support system. Participant is able to verbalize types and ability to use techniques and skills needed for reducing stress and depression.  Initial Review & Psychosocial Screening:     Initial Psych Review & Screening - 01/10/17 1329      Initial Review   Current issues with None Identified     Family Dynamics   Good Support System? Yes     Barriers   Psychosocial barriers to participate in program The patient should benefit from training in stress management and relaxation.     Screening Interventions   Interventions Encouraged to exercise      Quality of Life Scores:     Quality of Life - 01/10/17 1147      Quality of Life Scores   Health/Function Pre 25.5 %   Socioeconomic Pre 27.86 %   Psych/Spiritual Pre 26.79 %   Family Pre 30 %   GLOBAL Pre 26.91 %      PHQ-9: Recent Review Flowsheet Data    Depression screen [facility] 2/9 01/14/2017 08/19/2015 01/12/2015   Decreased Interest 0 0 0   Down, Depressed, Hopeless 0 0 0   PHQ - 2 Score 0 0 0     Interpretation of Total Score  Total Score Depression Severity:  1-4 = Minimal depression, 5-9 = Mild depression, 10-14 = Moderate depression, 15-19 = Moderately severe depression, 20-27 = Severe depression   Psychosocial Evaluation and Intervention:   Psychosocial Re-Evaluation:     Psychosocial Re-Evaluation    Row Name 02/06/17 1203 03/01/17 1409           Psychosocial Re-Evaluation   Current issues with None Identified None Identified      Interventions Encouraged to attend Cardiac Rehabilitation for the exercise Encouraged to attend Cardiac Rehabilitation for the exercise      Continue Psychosocial Services  No Follow up required No Follow up required         Psychosocial Discharge  (Final Psychosocial Re-Evaluation):     Psychosocial Re-Evaluation - 03/01/17 1409      Psychosocial Re-Evaluation   Current issues with None Identified   Interventions Encouraged to attend Cardiac Rehabilitation for the exercise   Continue Psychosocial Services  No Follow up required      Vocational Rehabilitation: Provide vocational rehab assistance to qualifying candidates.   Vocational Rehab Evaluation & Intervention:     Vocational Rehab - 01/10/17 1405      Initial Vocational Rehab Evaluation & Intervention   Assessment shows need for Vocational Rehabilitation No      Education: Education Goals: Education classes will be provided on a weekly basis, covering required topics. Participant will state understanding/return demonstration of topics presented.  Learning Barriers/Preferences:     Learning Barriers/Preferences - 01/10/17 0851      Learning Barriers/Preferences   Learning Barriers Sight   Learning Preferences Written Material      Education Topics: Count Your Pulse:  -Group instruction provided by verbal instruction, demonstration, patient participation and written materials to support subject.  Instructors address importance of being able  to find your pulse and how to count your pulse when at home without a heart monitor.  Patients get hands on experience counting their pulse with staff help and individually.   Heart Attack, Angina, and Risk Factor Modification:  -Group instruction provided by verbal instruction, video, and written materials to support subject.  Instructors address signs and symptoms of angina and heart attacks.    Also discuss risk factors for heart disease and how to make changes to improve heart health risk factors.   Functional Fitness:  -Group instruction provided by verbal instruction, demonstration, patient participation, and written materials to support subject.  Instructors address safety measures for doing things around the  house.  Discuss how to get up and down off the floor, how to pick things up properly, how to safely get out of a chair without assistance, and balance training.   Meditation and Mindfulness:  -Group instruction provided by verbal instruction, patient participation, and written materials to support subject.  Instructor addresses importance of mindfulness and meditation practice to help reduce stress and improve awareness.  Instructor also leads participants through a meditation exercise.    Stretching for Flexibility and Mobility:  -Group instruction provided by verbal instruction, patient participation, and written materials to support subject.  Instructors lead participants through series of stretches that are designed to increase flexibility thus improving mobility.  These stretches are additional exercise for major muscle groups that are typically performed during regular warm up and cool down.   CARDIAC REHAB PHASE II EXERCISE from 02/01/2017 in [place]  Date  02/01/17  Instruction Review Code  2- meets goals/outcomes      Hands Only CPR:  -Group verbal, video, and participation provides a basic overview of AHA guidelines for community CPR. Role-play of emergencies allow participants the opportunity to practice calling for help and chest compression technique with discussion of AED use.   Hypertension: -Group verbal and written instruction that provides a basic overview of hypertension including the most recent diagnostic guidelines, risk factor reduction with self-care instructions and medication management.    Nutrition I class: Heart Healthy Eating:  -Group instruction provided by PowerPoint slides, verbal discussion, and written materials to support subject matter. The instructor gives an explanation and review of the Therapeutic Lifestyle Changes diet recommendations, which includes a discussion on lipid goals, dietary fat, sodium, fiber, plant  stanol/sterol esters, sugar, and the components of a well-balanced, healthy diet.   Nutrition II class: Lifestyle Skills:  -Group instruction provided by PowerPoint slides, verbal discussion, and written materials to support subject matter. The instructor gives an explanation and review of label reading, grocery shopping for heart health, heart healthy recipe modifications, and ways to make healthier choices when eating out.   Diabetes Question & Answer:  -Group instruction provided by PowerPoint slides, verbal discussion, and written materials to support subject matter. The instructor gives an explanation and review of diabetes co-morbidities, pre- and post-prandial blood glucose goals, pre-exercise blood glucose goals, signs, symptoms, and treatment of hypoglycemia and hyperglycemia, and foot care basics.   Diabetes Blitz:  -Group instruction provided by PowerPoint slides, verbal discussion, and written materials to support subject matter. The instructor gives an explanation and review of the physiology behind type 1 and type 2 diabetes, diabetes medications and rational behind using different medications, pre- and post-prandial blood glucose recommendations and Hemoglobin A1c goals, diabetes diet, and exercise including blood glucose guidelines for exercising safely.    Portion Distortion:  -Group instruction provided  by PowerPoint slides, verbal discussion, written materials, and food models to support subject matter. The instructor gives an explanation of serving size versus portion size, changes in portions sizes over the last 20 years, and what consists of a serving from each food group.   Stress Management:  -Group instruction provided by verbal instruction, video, and written materials to support subject matter.  Instructors review role of stress in heart disease and how to cope with stress positively.     Exercising on Your Own:  -Group instruction provided by verbal instruction,  power point, and written materials to support subject.  Instructors discuss benefits of exercise, components of exercise, frequency and intensity of exercise, and end points for exercise.  Also discuss use of nitroglycerin and activating EMS.  Review options of places to exercise outside of rehab.  Review guidelines for sex with heart disease.   Cardiac Drugs I:  -Group instruction provided by verbal instruction and written materials to support subject.  Instructor reviews cardiac drug classes: antiplatelets, anticoagulants, beta blockers, and statins.  Instructor discusses reasons, side effects, and lifestyle considerations for each drug class.   Cardiac Drugs II:  -Group instruction provided by verbal instruction and written materials to support subject.  Instructor reviews cardiac drug classes: angiotensin converting enzyme inhibitors (ACE-I), angiotensin II receptor blockers (ARBs), nitrates, and calcium channel blockers.  Instructor discusses reasons, side effects, and lifestyle considerations for each drug class.   Anatomy and Physiology of the Circulatory System:  Group verbal and written instruction and models provide basic cardiac anatomy and physiology, with the coronary electrical and arterial systems. Review of: AMI, Angina, Valve disease, Heart Failure, Peripheral Artery Disease, Cardiac Arrhythmia, Pacemakers, and the ICD.   Other Education:  -Group or individual verbal, written, or video instructions that support the educational goals of the cardiac rehab program.   Knowledge Questionnaire Score:     Knowledge Questionnaire Score - 01/10/17 1144      Knowledge Questionnaire Score   Pre Score 23/24      Core Components/Risk Factors/Patient Goals at Admission:     Personal Goals and Risk Factors at Admission - 01/10/17 1204      Core Components/Risk Factors/Patient Goals on Admission   Hypertension Yes   Intervention Provide education on lifestyle modifcations  including regular physical activity/exercise, weight management, moderate sodium restriction and increased consumption of fresh fruit, vegetables, and low fat dairy, alcohol moderation, and smoking cessation.;Monitor prescription use compliance.   Expected Outcomes Short Term: Continued assessment and intervention until BP is < 140/59m HG in hypertensive participants. < 130/859mHG in hypertensive participants with diabetes, heart failure or chronic kidney disease.;Long Term: Maintenance of blood pressure at goal levels.   Lipids Yes   Intervention Provide education and support for participant on nutrition & aerobic/resistive exercise along with prescribed medications to achieve LDL <7024mHDL >68m99m Expected Outcomes Short Term: Participant states understanding of desired cholesterol values and is compliant with medications prescribed. Participant is following exercise prescription and nutrition guidelines.;Long Term: Cholesterol controlled with medications as prescribed, with individualized exercise RX and with personalized nutrition plan. Value goals: LDL < 70mg62mL > 40 mg.      Core Components/Risk Factors/Patient Goals Review:      Goals and Risk Factor Review    Row Name 02/06/17 1156 03/01/17 1408           Core Components/Risk Factors/Patient Goals Review   Personal Goals Review Lipids;Hypertension Lipids;Hypertension      Review Barry's vital signs  have been stable at cardiac rehab Barry's vital signs have been stable at cardiac rehab      Expected Outcomes [name] will continue to exercise at cardiac rehab and take his medicaitons as presribed [name] will continue to exercise at cardiac rehab and take his medicaitons as presribed         Core Components/Risk Factors/Patient Goals at Discharge (Final Review):      Goals and Risk Factor Review - 03/01/17 1408      Core Components/Risk Factors/Patient Goals Review   Personal Goals Review Lipids;Hypertension   Review Barry's  vital signs have been stable at cardiac rehab   Expected Outcomes [name] will continue to exercise at cardiac rehab and take his medicaitons as presribed      ITP Comments:     ITP Comments    Row Name 01/10/17 0848           ITP Comments Medical Director, Dr. Fransico Him          Comments: [name] is making expected progress toward personal goals after completing 23 sessions. Recommend continued exercise and life style modification education including  stress management and relaxation techniques to decrease cardiac risk profile. [name] continues to do well with exercise.Barnet Pall, RN,BSN 03/01/2017 4:55 PM

## 2017-03-04 ENCOUNTER — Encounter (HOSPITAL_COMMUNITY)
Admission: RE | Admit: 2017-03-04 | Discharge: 2017-03-04 | Disposition: A | Discharge status: Home / Self Care | Payer: Medicare Other | Source: Ambulatory Visit | Attending: Cardiovascular Disease | Admitting: Cardiovascular Disease

## 2017-03-04 DIAGNOSIS — Z951 Presence of aortocoronary bypass graft: Secondary | ICD-10-CM

## 2017-03-04 DIAGNOSIS — Z952 Presence of prosthetic heart valve: Secondary | ICD-10-CM

## 2017-03-05 ENCOUNTER — Encounter: Payer: Medicare Other | Admitting: Thoracic Surgery (Cardiothoracic Vascular Surgery)

## 2017-03-06 ENCOUNTER — Encounter (HOSPITAL_COMMUNITY)
Admission: RE | Admit: 2017-03-06 | Discharge: 2017-03-06 | Disposition: A | Discharge status: Home / Self Care | Payer: Medicare Other | Source: Ambulatory Visit | Attending: Cardiovascular Disease | Admitting: Cardiovascular Disease

## 2017-03-06 DIAGNOSIS — Z952 Presence of prosthetic heart valve: Secondary | ICD-10-CM

## 2017-03-06 DIAGNOSIS — Z951 Presence of aortocoronary bypass graft: Secondary | ICD-10-CM | POA: Diagnosis not present

## 2017-03-08 ENCOUNTER — Telehealth (HOSPITAL_COMMUNITY): Payer: Self-pay | Admitting: *Deleted

## 2017-03-08 ENCOUNTER — Encounter (HOSPITAL_COMMUNITY)
Admission: RE | Admit: 2017-03-08 | Discharge: 2017-03-08 | Disposition: A | Discharge status: Home / Self Care | Payer: Medicare Other | Source: Ambulatory Visit | Attending: Cardiovascular Disease | Admitting: Cardiovascular Disease

## 2017-03-08 DIAGNOSIS — Z952 Presence of prosthetic heart valve: Secondary | ICD-10-CM

## 2017-03-08 DIAGNOSIS — Z950 Presence of cardiac pacemaker: Secondary | ICD-10-CM

## 2017-03-08 DIAGNOSIS — Z951 Presence of aortocoronary bypass graft: Secondary | ICD-10-CM

## 2017-03-08 NOTE — Telephone Encounter (Signed)
-----   Message from Thayer Headings, MD sent at 03/06/2017  4:59 PM EST ----- Regarding: RE: Target heart rate increase OK to increase target HR as noted below  Liam Rogers   ----- Message ----- From: Sol Passer Sent: 03/06/2017   4:53 PM To: Thayer Headings, MD Subject: Target heart rate increase                     Greetings Dr. Acie Fredrickson,  Your patient Dakota Martin is doing well in Cardiac Rehab. He has been in the program for 8 weeks and occasionally exceeds his prescribed target heart rate (THR) with exercise.  His current THR is 58-116 (40-80%) and we would like to increase it to 138 max (95 %) for exercise. If you are agreeable to this change in exercise prescription please let us know.   Thanks so much for your help! Sol Passer, MS, ACSM CEP

## 2017-03-11 ENCOUNTER — Encounter (HOSPITAL_COMMUNITY)
Admission: RE | Admit: 2017-03-11 | Discharge: 2017-03-11 | Disposition: A | Discharge status: Home / Self Care | Payer: Medicare Other | Source: Ambulatory Visit | Attending: Cardiovascular Disease | Admitting: Cardiovascular Disease

## 2017-03-11 DIAGNOSIS — Z952 Presence of prosthetic heart valve: Secondary | ICD-10-CM

## 2017-03-11 DIAGNOSIS — Z951 Presence of aortocoronary bypass graft: Secondary | ICD-10-CM | POA: Diagnosis not present

## 2017-03-11 DIAGNOSIS — Z950 Presence of cardiac pacemaker: Secondary | ICD-10-CM

## 2017-03-13 ENCOUNTER — Ambulatory Visit: Payer: Medicare Other | Admitting: Internal Medicine

## 2017-03-13 ENCOUNTER — Encounter (HOSPITAL_COMMUNITY)
Admission: RE | Admit: 2017-03-13 | Discharge: 2017-03-13 | Disposition: A | Discharge status: Home / Self Care | Payer: Medicare Other | Source: Ambulatory Visit | Attending: Cardiovascular Disease | Admitting: Cardiovascular Disease

## 2017-03-13 ENCOUNTER — Encounter: Payer: Self-pay | Admitting: Internal Medicine

## 2017-03-13 VITALS — BP 150/72 | HR 71 | Ht 69.0 in | Wt 169.6 lb

## 2017-03-13 DIAGNOSIS — Z950 Presence of cardiac pacemaker: Secondary | ICD-10-CM | POA: Diagnosis not present

## 2017-03-13 DIAGNOSIS — Z954 Presence of other heart-valve replacement: Secondary | ICD-10-CM

## 2017-03-13 DIAGNOSIS — Z951 Presence of aortocoronary bypass graft: Secondary | ICD-10-CM | POA: Diagnosis not present

## 2017-03-13 DIAGNOSIS — Z952 Presence of prosthetic heart valve: Secondary | ICD-10-CM

## 2017-03-13 DIAGNOSIS — I442 Atrioventricular block, complete: Secondary | ICD-10-CM | POA: Diagnosis not present

## 2017-03-13 NOTE — Progress Notes (Signed)
HPI Mr. Kozak returns today for ongoing evaluation and management of complete heart block, status post permanent pacemaker insertion. This all occurred in the setting of aortic valve replacement. In the interim the patient has done well. He denies chest pain or shortness of breath. He admits to some dietary indiscretion with sodium. No syncope. No Known Allergies   Current Outpatient Medications  Medication Sig Dispense Refill  . acetaminophen (TYLENOL) 325 MG tablet Take 1-2 tablets (325-650 mg total) by mouth every 4 (four) hours as needed for mild pain.    [name] Kitchen allopurinol (ZYLOPRIM) 300 MG tablet Take 300 mg by mouth daily at 12 noon.     [name] Kitchen amoxicillin (AMOXIL) 500 MG capsule Take 2,000 mg by mouth See admin instructions. Take 4 capsules (2000 mg) by mouth 1 hour prior to dental procedures.    [name] Kitchen aspirin EC 81 MG EC tablet Take 1 tablet (81 mg total) by mouth daily.    [name] Kitchen atorvastatin (LIPITOR) 20 MG tablet Take 10 mg daily by mouth.    . carvedilol (COREG) 6.25 MG tablet TAKE 1 TABLET BY MOUTH TWO  TIMES DAILY (Patient taking differently: TAKE 1 TABLET (6.25 MG) BY MOUTH TWO  TIMES DAILY WITH BREAKFAST & WITH SUPPER) 180 tablet 3  . Cephalexin 500 MG tablet Take 500 mg as needed by mouth.    . cholecalciferol (VITAMIN D) 1000 units tablet Take 5,000 Units by mouth daily with breakfast.     . Coenzyme Q10 50 MG CAPS Take 50 mg by mouth daily with breakfast.    . Cyanocobalamin (VITAMIN B-12) 5000 MCG SUBL Place 5,000 mcg under the tongue 2 (two) times a week. Wednesday & Saturday.    . ezetimibe (ZETIA) 10 MG tablet Take 5 mg by mouth daily. In the morning.    . fish oil-omega-3 fatty acids 1000 MG capsule Take 1 g by mouth 2 (two) times daily.     . Magnesium 500 MG CAPS Take 500 mg by mouth every evening.     [name] Kitchen telmisartan (MICARDIS) 80 MG tablet Take 80 mg by mouth daily. In the morning.  11   No current facility-administered medications for this visit.      Past Medical  History:  Diagnosis Date  . Aortic valve disease   . Arthritis   . 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 Colon polyp   . Gout   . Heart murmur    as a younger person,   . Hyperlipidemia   . Hypertension   . 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 S/P aortic valve replacement with metallic valve 45/06/8880   Due to congenital aortic stenosis // [name] mechanical valve done in 1982 // Echo 09/10/16: Mild focal basal septal hypertrophy, [name], [name], [name], mod aortic root enlargement, aortic root 36 mm, ascending aorta 49 mm, [name], [name]    ROS:   All systems reviewed and negative except as noted in the HPI.   Past Surgical History:  Procedure Laterality Date  . Aortic valve  1982   prosthetic  val ve  . CARDIAC CATHETERIZATION    . cyst removed  1976   right side of neck  . TONSILLECTOMY  Family History  Problem Relation Age of Onset  . Hyperlipidemia Mother   . Colon cancer Mother   . Emphysema Father   . Other Father        respiratory failure  . Diabetes Maternal Grandfather   . Colon polyps Neg Hx   . Kidney disease Neg Hx   . Esophageal cancer Neg Hx   . Gallbladder disease Neg Hx      Social History   Socioeconomic History  . Marital status: Married    Spouse name: Not on file  . Number of children: 2  . Years of education: Not on file  . Highest education level: Not on file  Social Needs  . Financial resource strain: Not on file  . Food insecurity - worry: Not on file  . Food insecurity - inability: Not on file  . Transportation needs - medical: Not on file  . Transportation needs - non-medical: Not on file  Occupational History  . Occupation: Retired  Tobacco Use  . Smoking  status: Never Smoker  . Smokeless tobacco: Never Used  Substance and Sexual Activity  . Alcohol use: Yes    Alcohol/week: 0.0 oz    Comment: 2 drinks a day  . Drug use: No  . Sexual activity: Not on file  Other Topics Concern  . 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 HEART:  Regular rate rhythm, no murmurs, no rubs, no clicks Abd:  soft, positive bowel sounds, no organomegally, no rebound, no guarding Ext:  2 plus pulses, no edema, no cyanosis, no clubbing Skin:  No rashes no nodules Neuro:  CN II through XII intact, motor grossly intact  EKG - normal sinus rhythm with P synchronous ventricular pacing  DEVICE  Normal device function.  See PaceArt for details.   Assess/Plan: 1. Complete heart block - he is asymptomatic status post pacemaker insertion. 2. Pacemaker - interrogation of his Medtronic dual-chamber pacemaker demonstrates normal pacing function. He has no underlying escape rhythm today. Patient activity is good with almost 6 hours of activity per day. His thresholds are satisfactory. 3. Aortic stenosis - he is doing well after aortic valve replacement. His exam is normal. 4. Coronary artery disease - he denies anginal symptoms. He will continue his current medications.  Cristopher Peru, M.D.

## 2017-03-13 NOTE — Patient Instructions (Addendum)
Medication Instructions:  Your physician recommends that you continue on your current medications as directed. Please refer to the Current Medication list given to you today.  Labwork: None ordered.  Testing/Procedures: None ordered.  Follow-Up: Your physician wants you to follow-up in: 9 months with Dr. Lovena Le.   You will receive a reminder letter in the mail two months in advance. If you don't receive a letter, please call our office to schedule the follow-up appointment.  Remote monitoring is used to monitor your Pacemaker from home. This monitoring reduces the number of office visits required to check your device to one time per year. It allows [place] to keep an eye on the functioning of your device to ensure it is working properly. You are scheduled for a device check from home on 06/12/2017. You may send your transmission at any time that day. If you have a wireless device, the transmission will be sent automatically. After your physician reviews your transmission, you will receive a postcard with your next transmission date.    Any Other Special Instructions Will Be Listed Below (If Applicable).     If you need a refill on your cardiac medications before your next appointment, please call your pharmacy.

## 2017-03-14 ENCOUNTER — Encounter: Payer: Self-pay | Admitting: Internal Medicine

## 2017-03-15 ENCOUNTER — Encounter (HOSPITAL_COMMUNITY)
Admission: RE | Admit: 2017-03-15 | Discharge: 2017-03-15 | Disposition: A | Discharge status: Home / Self Care | Payer: Medicare Other | Source: Ambulatory Visit | Attending: Cardiovascular Disease | Admitting: Cardiovascular Disease

## 2017-03-15 DIAGNOSIS — Z951 Presence of aortocoronary bypass graft: Secondary | ICD-10-CM

## 2017-03-15 DIAGNOSIS — Z950 Presence of cardiac pacemaker: Secondary | ICD-10-CM

## 2017-03-15 DIAGNOSIS — Z952 Presence of prosthetic heart valve: Secondary | ICD-10-CM

## 2017-03-15 LAB — CUP PACEART INCLINIC DEVICE CHECK
Battery Remaining Longevity: 138 mo
Battery Voltage: 3.13 V
Brady Statistic AP VP Percent: 0.65 %
Brady Statistic AS VS Percent: 0.57 %
Brady Statistic RV Percent Paced: 99.43 %
Date Time Interrogation Session: 20181114211059
Implantable Lead Implant Date: 20180730
Implantable Lead Implant Date: 20180730
Implantable Lead Location: 753860
Implantable Pulse Generator Implant Date: 20180730
Lead Channel Impedance Value: 589 Ohm
Lead Channel Pacing Threshold Amplitude: 0.75 V
Lead Channel Pacing Threshold Amplitude: 0.75 V
Lead Channel Pacing Threshold Pulse Width: 0.4 ms
Lead Channel Sensing Intrinsic Amplitude: 1.5 mV
Lead Channel Setting Pacing Amplitude: 1.75 V
Lead Channel Setting Sensing Sensitivity: 4 mV
MDC IDC LEAD LOCATION: 753859
MDC IDC MSMT LEADCHNL RA IMPEDANCE VALUE: 285 Ohm
MDC IDC MSMT LEADCHNL RA IMPEDANCE VALUE: 532 Ohm
MDC IDC MSMT LEADCHNL RV IMPEDANCE VALUE: 437 Ohm
MDC IDC MSMT LEADCHNL RV PACING THRESHOLD PULSEWIDTH: 0.4 ms
MDC IDC MSMT LEADCHNL RV SENSING INTR AMPL: 11.875 mV
MDC IDC SET LEADCHNL RV PACING AMPLITUDE: 2.5 V
MDC IDC SET LEADCHNL RV PACING PULSEWIDTH: 0.4 ms
MDC IDC STAT BRADY AP VS PERCENT: 0 %
MDC IDC STAT BRADY AS VP PERCENT: 98.78 %
MDC IDC STAT BRADY RA PERCENT PACED: 0.7 %

## 2017-03-18 ENCOUNTER — Encounter (HOSPITAL_COMMUNITY)
Admission: RE | Admit: 2017-03-18 | Discharge: 2017-03-18 | Disposition: A | Discharge status: Home / Self Care | Payer: Medicare Other | Source: Ambulatory Visit | Attending: Cardiovascular Disease | Admitting: Cardiovascular Disease

## 2017-03-18 DIAGNOSIS — Z951 Presence of aortocoronary bypass graft: Secondary | ICD-10-CM

## 2017-03-18 DIAGNOSIS — Z952 Presence of prosthetic heart valve: Secondary | ICD-10-CM

## 2017-03-18 DIAGNOSIS — Z950 Presence of cardiac pacemaker: Secondary | ICD-10-CM

## 2017-03-20 ENCOUNTER — Encounter (HOSPITAL_COMMUNITY)
Admission: RE | Admit: 2017-03-20 | Discharge: 2017-03-20 | Disposition: A | Discharge status: Home / Self Care | Payer: Medicare Other | Source: Ambulatory Visit | Attending: Cardiovascular Disease | Admitting: Cardiovascular Disease

## 2017-03-20 DIAGNOSIS — Z951 Presence of aortocoronary bypass graft: Secondary | ICD-10-CM | POA: Diagnosis not present

## 2017-03-20 DIAGNOSIS — Z952 Presence of prosthetic heart valve: Secondary | ICD-10-CM

## 2017-03-20 DIAGNOSIS — Z950 Presence of cardiac pacemaker: Secondary | ICD-10-CM

## 2017-03-25 ENCOUNTER — Encounter (HOSPITAL_COMMUNITY)
Admission: RE | Admit: 2017-03-25 | Discharge: 2017-03-25 | Disposition: A | Discharge status: Home / Self Care | Payer: Medicare Other | Source: Ambulatory Visit | Attending: Cardiovascular Disease | Admitting: Cardiovascular Disease

## 2017-03-25 DIAGNOSIS — Z952 Presence of prosthetic heart valve: Secondary | ICD-10-CM

## 2017-03-25 DIAGNOSIS — Z951 Presence of aortocoronary bypass graft: Secondary | ICD-10-CM | POA: Diagnosis not present

## 2017-03-25 DIAGNOSIS — Z950 Presence of cardiac pacemaker: Secondary | ICD-10-CM

## 2017-03-27 ENCOUNTER — Encounter (HOSPITAL_COMMUNITY)
Admission: RE | Admit: 2017-03-27 | Discharge: 2017-03-27 | Disposition: A | Discharge status: Home / Self Care | Payer: Medicare Other | Source: Ambulatory Visit | Attending: Cardiovascular Disease | Admitting: Cardiovascular Disease

## 2017-03-27 DIAGNOSIS — Z952 Presence of prosthetic heart valve: Secondary | ICD-10-CM

## 2017-03-27 DIAGNOSIS — Z951 Presence of aortocoronary bypass graft: Secondary | ICD-10-CM

## 2017-03-27 DIAGNOSIS — Z950 Presence of cardiac pacemaker: Secondary | ICD-10-CM

## 2017-03-29 ENCOUNTER — Encounter (HOSPITAL_COMMUNITY)
Admission: RE | Admit: 2017-03-29 | Discharge: 2017-03-29 | Disposition: A | Discharge status: Home / Self Care | Payer: Medicare Other | Source: Ambulatory Visit | Attending: Cardiovascular Disease | Admitting: Cardiovascular Disease

## 2017-03-29 DIAGNOSIS — Z950 Presence of cardiac pacemaker: Secondary | ICD-10-CM

## 2017-03-29 DIAGNOSIS — Z951 Presence of aortocoronary bypass graft: Secondary | ICD-10-CM

## 2017-03-29 DIAGNOSIS — Z952 Presence of prosthetic heart valve: Secondary | ICD-10-CM

## 2017-03-29 NOTE — Progress Notes (Signed)
Cardiac Individual Treatment Plan  Patient Details  Name: Dakota Martin MRN: [id number] Date of Birth: [birth year] Referring Provider:     [place] from 01/10/2017 in [place]  Referring Provider  Nahser, Philip MD      Initial Encounter Date:    CARDIAC REHAB PHASE II ORIENTATION from 01/10/2017 in [place]  Date  01/10/17  Referring Provider  Nahser, Arnette Norris MD      Visit Diagnosis: Pacemaker  S/P AVR (aortic valve replacement)  S/P CABG x 2  Patient's Home Medications on Admission:  Current Outpatient Medications:  .  acetaminophen (TYLENOL) 325 MG tablet, Take 1-2 tablets (325-650 mg total) by mouth every 4 (four) hours as needed for mild pain., Disp: , Rfl:  .  allopurinol (ZYLOPRIM) 300 MG tablet, Take 300 mg by mouth daily at 12 noon. , Disp: , Rfl:  .  amoxicillin (AMOXIL) 500 MG capsule, Take 2,000 mg by mouth See admin instructions. Take 4 capsules (2000 mg) by mouth 1 hour prior to dental procedures., Disp: , Rfl:  .  aspirin EC 81 MG EC tablet, Take 1 tablet (81 mg total) by mouth daily., Disp: , Rfl:  .  atorvastatin (LIPITOR) 20 MG tablet, Take 10 mg daily by mouth., Disp: , Rfl:  .  carvedilol (COREG) 6.25 MG tablet, TAKE 1 TABLET BY MOUTH TWO  TIMES DAILY (Patient taking differently: TAKE 1 TABLET (6.25 MG) BY MOUTH TWO  TIMES DAILY WITH BREAKFAST & WITH SUPPER), Disp: 180 tablet, Rfl: 3 .  Cephalexin 500 MG tablet, Take 500 mg as needed by mouth., Disp: , Rfl:  .  cholecalciferol (VITAMIN D) 1000 units tablet, Take 5,000 Units by mouth daily with breakfast. , Disp: , Rfl:  .  Coenzyme Q10 50 MG CAPS, Take 50 mg by mouth daily with breakfast., Disp: , Rfl:  .  Cyanocobalamin (VITAMIN B-12) 5000 MCG SUBL, Place 5,000 mcg under the tongue 2 (two) times a week. Wednesday & Saturday., Disp: , Rfl:  .  ezetimibe (ZETIA) 10 MG tablet, Take 5 mg by mouth daily. In the  morning., Disp: , Rfl:  .  fish oil-omega-3 fatty acids 1000 MG capsule, Take 1 g by mouth 2 (two) times daily. , Disp: , Rfl:  .  Magnesium 500 MG CAPS, Take 500 mg by mouth every evening. , Disp: , Rfl:  .  telmisartan (MICARDIS) 80 MG tablet, Take 80 mg by mouth daily. In the morning., Disp: , Rfl: 11  Past Medical History: Past Medical History:  Diagnosis Date  . Aortic valve disease   . Arthritis   . 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 Colon polyp   . Gout   . Heart murmur    as a younger person,   . Hyperlipidemia   . Hypertension   . 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 // [name] mechanical valve done in 1982 // Echo 09/10/16: Mild focal basal septal hypertrophy, [name], [name], [name], mod aortic root enlargement, aortic root 36 mm, ascending aorta 49 mm, [name], [name]    Tobacco Use:  Social History   Tobacco Use  Smoking Status Never Smoker  Smokeless Tobacco Never Used    Labs: Recent Review Flowsheet Data    Labs for ITP Cardiac and Pulmonary Rehab Latest Ref Rng & Units 11/21/2016 11/21/2016 11/22/2016 11/22/2016 11/22/2016   Hemoglobin A1c 4.8 - 5.6 % - - - - -   PHART 7.350 - 7.450 - 7.503(H) 7.418 7.422 -   PCO2ART 32.0 - 48.0 mmHg - 28.9(L) 34.3 35.2 -   HCO3 20.0 - 28.0 mmol/L - 22.7 22.0 22.8 -   TCO2 0 - 100 mmol/L _0 ACIDBASEDEF 0.0 - 2.0 mmol/L - - 2.0 1.0 -   O2SAT % - 98.0 94.0 94.0 -      Capillary Blood Glucose: Lab Results  Component Value Date   GLUCAP 97 11/27/2016   GLUCAP 120 (H) 11/26/2016   GLUCAP 115 (H) 11/26/2016   GLUCAP 122 (H) 11/26/2016   GLUCAP 102 (H)  11/26/2016     Exercise Target Goals:    Exercise Program Goal: Individual exercise prescription set with THRR, safety & activity barriers. Participant demonstrates ability to understand and report RPE using BORG scale, to self-measure pulse accurately, and to acknowledge the importance of the exercise prescription.  Exercise Prescription Goal: Starting with aerobic activity 30 plus minutes a day, 3 days per week for initial exercise prescription. Provide home exercise prescription and guidelines that participant acknowledges understanding prior to discharge.  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 HR  108 bpm     Max Ex. BP  118/58     2 Minute Post BP  90/50 given banana/drinking water recheck 98/60        Oxygen Initial Assessment:   Oxygen Re-Evaluation:   Oxygen Discharge (Final Oxygen Re-Evaluation):   Initial Exercise Prescription: Initial Exercise Prescription - 01/10/17 1100      Date of Initial Exercise RX and Referring Provider   Date  01/10/17    Referring Provider  Nahser, Arnette Norris MD      Bike   Level  0.7    Minutes  10    METs  2.76      NuStep   Level  3    SPM  80    Minutes  10    METs  2.5      Track  Laps  10    Minutes  10    METs  2.74       Prescription Details   Frequency (times per week)  3    Duration  Progress to 30 minutes of continuous aerobic without signs/symptoms of physical distress      Intensity   THRR 40-80% of Max Heartrate  58-116    Ratings of Perceived Exertion  11-13    Perceived Dyspnea  0-4      Progression   Progression  Continue to progress workloads to maintain intensity without signs/symptoms of physical distress.      Resistance Training   Training Prescription  Yes    Weight  3lbs    Reps  10-15       Perform Capillary Blood Glucose checks as needed.  Exercise Prescription Changes: Exercise Prescription Changes    Row Name 01/21/17 1700 02/04/17 1500 02/18/17 1200 03/18/17 0949 03/27/17 0949     Response to Exercise   Blood Pressure (Admit)  118/78  132/80  114/70  118/62  114/86   Blood Pressure (Exercise)  122/78  138/76  138/72  118/72  152/70   Blood Pressure (Exit)  112/62  112/72  120/70  110/72  122/62   Heart Rate (Admit)  90 bpm  85 bpm  90 bpm  81 bpm  75 bpm   Heart Rate (Exercise)  120 bpm  121 bpm  130 bpm  127 bpm  126 bpm   Heart Rate (Exit)  88 bpm  85 bpm  96 bpm  89 bpm  84 bpm   Rating of Perceived Exertion (Exercise)  _0 Symptoms  none  none  none  none  none   Comments  Off to a good start with exercise.  -  -  -  -   Duration  Progress to 30 minutes of  aerobic without signs/symptoms of physical distress  Progress to 30 minutes of  aerobic without signs/symptoms of physical distress  Progress to 30 minutes of  aerobic without signs/symptoms of physical distress  Continue with 30 min of aerobic exercise without signs/symptoms of physical distress.  Continue with 30 min of aerobic exercise without signs/symptoms of physical distress.   Intensity  THRR unchanged  THRR unchanged  THRR unchanged  THRR unchanged  THRR unchanged     Progression   Progression  Continue to progress workloads to maintain intensity without signs/symptoms of physical  distress.  Continue to progress workloads to maintain intensity without signs/symptoms of physical distress.  Continue to progress workloads to maintain intensity without signs/symptoms of physical distress.  Continue to progress workloads to maintain intensity without signs/symptoms of physical distress.  Continue to progress workloads to maintain intensity without signs/symptoms of physical distress.   Average METs  2.9  3._1 4.1     Resistance Training   Training Prescription  Yes  Yes  Yes  Yes  No Relaxation   Weight  3lbs  3lbs  3lbs  4lbs  -   Reps  10-15  10-15  10-15  10-15  -   Time  10 Minutes  10 Minutes  10 Minutes  10 Minutes  -     Interval Training   Interval Training  No  No  No  No  No     Bike   Level  0.7  1.2  1.4  1.4  1.4  Minutes  _0 METs  2.75  3.94  4.48  4.43  4.44     NuStep   Level  _1 SPM  80  80  80  80  80   Minutes  _2 METs  2.3  3  3.5  4  4.1     Track   Laps  _3 Minutes  _4 METs  3.79  3.95  4.14  3.61  3.61     Home Exercise Plan   Plans to continue exercise at  Home (comment)  Home (comment)  Home (comment)  Home (comment)  Home (comment)   Frequency  Add 4 additional days to program exercise sessions.  Add 4 additional days to program exercise sessions.  Add 4 additional days to program exercise sessions.  Add 4 additional days to program exercise sessions.  Add 4 additional days to program exercise sessions.   Initial Home Exercises Provided  01/21/17  01/21/17  01/21/17  01/21/17  01/21/17      Exercise Comments: Exercise Comments    Row Name 01/21/17 1013 02/06/17 1102 02/18/17 0945 03/18/17 0949 03/27/17 0949   Exercise Comments  Reviewed home exercise goals with patient.  Reviewed METs and goals with patient.  Reviewed METs and goals with patient.  Reviewed goals with patient.  Reviewed METs with patient.      Exercise Goals and  Review: Exercise Goals    Row Name 01/10/17 0854             Exercise Goals   Increase Physical Activity  Yes return to yardwork       Intervention  Provide advice, education, support and counseling about physical activity/exercise needs.;Develop an individualized exercise prescription for aerobic and resistive training based on initial evaluation findings, risk stratification, comorbidities and participant's personal goals.       Expected Outcomes  Achievement of increased cardiorespiratory fitness and enhanced flexibility, muscular endurance and strength shown through measurements of functional capacity and personal statement of participant.       Increase Strength and Stamina  Yes return to playing tennis and return to gym like routine       Intervention  Provide advice, education, support and counseling about physical activity/exercise needs.;Develop an individualized exercise prescription for aerobic and resistive training based on initial evaluation findings, risk stratification, comorbidities and participant's personal goals.       Expected Outcomes  Achievement of increased cardiorespiratory fitness and enhanced flexibility, muscular endurance and strength shown through measurements of functional capacity and personal statement of participant.       Able to understand and use rate of perceived exertion (RPE) scale  Yes       Intervention  Provide education and explanation on how to use RPE scale       Expected Outcomes  Short Term: Able to use RPE daily in rehab to express subjective intensity level;Long Term:  Able to use RPE to guide intensity level when exercising independently       Knowledge and understanding of Target Heart Rate Range (THRR)  Yes       Intervention  Provide education and explanation of THRR including how the numbers were predicted and where they are located for  reference       Expected Outcomes  Short Term: Able to state/look up THRR;Long Term: Able to use THRR to  govern intensity when exercising independently;Short Term: Able to use daily as guideline for intensity in rehab       Able to check pulse independently  Yes       Intervention  Provide education and demonstration on how to check pulse in carotid and radial arteries.;Review the importance of being able to check your own pulse for safety during independent exercise       Expected Outcomes  Short Term: Able to explain why pulse checking is important during independent exercise;Long Term: Able to check pulse independently and accurately       Understanding of Exercise Prescription  Yes       Intervention  Provide education, explanation, and written materials on patient's individual exercise prescription       Expected Outcomes  Short Term: Able to explain program exercise prescription;Long Term: Able to explain home exercise prescription to exercise independently          Exercise Goals Re-Evaluation : Exercise Goals Re-Evaluation    Row Name 01/21/17 1708 02/06/17 1103 02/18/17 0945 03/18/17 0049       Exercise Goal Re-Evaluation   Exercise Goals Review  Knowledge and understanding of Target Heart Rate Range (THRR);Understanding of Exercise Prescription;Able to understand and use rate of perceived exertion (RPE) scale  Increase Physical Activity  Increase Physical Activity;Knowledge and understanding of Target Heart Rate Range (THRR);Able to understand and use rate of perceived exertion (RPE) scale  Increase Physical Activity    Comments  Reviewed home exericse prescription with patient including THRR and RPE scale.  Patient is doing well with exercise. Pt has been cleared by physician to resume senior chair yoga.  Making very good progress with exercise. Patient is walking 30 minutes, 3 days/week. Patient knows THRR and is able to use the RPE scale appropriately.  Doing well with exercise. Pt states that he does senior yoga 1 day/week and walks 30 minutes daily in addition to exercise at [facility].     Expected Outcomes  Continue walking 30 minutes, 3 days/week in addition to cardiac rehab. Add senior chair yoga when cleared by physician.  Continue walking 30 minutes and/or participating in chair yoga 45 minutes, 3 days/week in addition to CR.  Continue exercise prescription, walking and participating in yoga in addition to cardiac rehab.  Continue daily exercise to achieve personal health and fitness goals.        Discharge Exercise Prescription (Final Exercise Prescription Changes): Exercise Prescription Changes - 03/27/17 0949      Response to Exercise   Blood Pressure (Admit)  114/86    Blood Pressure (Exercise)  152/70    Blood Pressure (Exit)  122/62    Heart Rate (Admit)  75 bpm    Heart Rate (Exercise)  126 bpm    Heart Rate (Exit)  84 bpm    Rating of Perceived Exertion (Exercise)  12    Symptoms  none    Duration  Continue with 30 min of aerobic exercise without signs/symptoms of physical distress.    Intensity  THRR unchanged      Progression   Progression  Continue to progress workloads to maintain intensity without signs/symptoms of physical distress.    Average METs  4.1      Resistance Training   Training Prescription  No Relaxation      Interval Training   Interval Training  No      Bike   Level  1.4    Minutes  10    METs  4.44      NuStep   Level  6    SPM  80    Minutes  10    METs  4.1      Track   Laps  15    Minutes  10    METs  3.61      Home Exercise Plan   Plans to continue exercise at  Home (comment)    Frequency  Add 4 additional days to program exercise sessions.    Initial Home Exercises Provided  01/21/17       Nutrition:  Target Goals: Understanding of nutrition guidelines, daily intake of sodium <1560m, cholesterol <2042m calories 30% from fat and 7% or less from saturated fats, daily to have 5 or more servings of fruits and vegetables.  Biometrics: Pre Biometrics - 01/10/17 1204      Pre Biometrics   Waist Circumference   37 inches    Hip Circumference  35.75 inches    Waist to Hip Ratio  1.03 %    Triceps Skinfold  15 mm    % Body Fat  24.9 %    Grip Strength  40 kg    Flexibility  0 in    Single Leg Stand  24 seconds        Nutrition Therapy Plan and Nutrition Goals: Nutrition Therapy & Goals - 01/10/17 1234      Nutrition Therapy   Diet  Therapeutic Lifestyle Changes    Drug/Food Interactions  Coumadin/Vit K      Personal Nutrition Goals   Nutrition Goal  Pt to describe the benefit of including fruits, vegetables, whole grains, and low-fat dairy products in a heart healthy meal plan.      Intervention Plan   Intervention  Prescribe, educate and counsel regarding individualized specific dietary modifications aiming towards targeted core components such as weight, hypertension, lipid management, diabetes, heart failure and other comorbidities.    Expected Outcomes  Short Term Goal: Understand basic principles of dietary content, such as calories, fat, sodium, cholesterol and nutrients.;Long Term Goal: Adherence to prescribed nutrition plan.       Nutrition Discharge: Nutrition Scores: Nutrition Assessments - 01/10/17 1229      MEDFICTS Scores   Pre Score  33       Nutrition Goals Re-Evaluation:   Nutrition Goals Re-Evaluation:   Nutrition Goals Discharge (Final Nutrition Goals Re-Evaluation):   Psychosocial: Target Goals: Acknowledge presence or absence of significant depression and/or stress, maximize coping skills, provide positive support system. Participant is able to verbalize types and ability to use techniques and skills needed for reducing stress and depression.  Initial Review & Psychosocial Screening: Initial Psych Review & Screening - 01/10/17 1329      Initial Review   Current issues with  None Identified      Family Dynamics   Good Support System?  Yes      Barriers   Psychosocial barriers to participate in program  The patient should benefit from training in  stress management and relaxation.      Screening Interventions   Interventions  Encouraged to exercise       Quality of Life Scores: Quality of Life - 01/10/17 1147      Quality of Life Scores   Health/Function Pre  25.5 %    Socioeconomic Pre  27.86 %  Psych/Spiritual Pre  26.79 %    Family Pre  30 %    GLOBAL Pre  26.91 %       PHQ-9: Recent Review Flowsheet Data    Depression screen [facility] 2/9 01/14/2017 08/19/2015 01/12/2015   Decreased Interest 0 0 0   Down, Depressed, Hopeless 0 0 0   PHQ - 2 Score 0 0 0     Interpretation of Total Score  Total Score Depression Severity:  1-4 = Minimal depression, 5-9 = Mild depression, 10-14 = Moderate depression, 15-19 = Moderately severe depression, 20-27 = Severe depression   Psychosocial Evaluation and Intervention:   Psychosocial Re-Evaluation: Psychosocial Re-Evaluation    Row Name 02/06/17 1203 03/01/17 1409 03/29/17 1706         Psychosocial Re-Evaluation   Current issues with  None Identified  None Identified  None Identified     Interventions  Encouraged to attend Cardiac Rehabilitation for the exercise  Encouraged to attend Cardiac Rehabilitation for the exercise  Encouraged to attend Cardiac Rehabilitation for the exercise     Continue Psychosocial Services   No Follow up required  No Follow up required  No Follow up required        Psychosocial Discharge (Final Psychosocial Re-Evaluation): Psychosocial Re-Evaluation - 03/29/17 1706      Psychosocial Re-Evaluation   Current issues with  None Identified    Interventions  Encouraged to attend Cardiac Rehabilitation for the exercise    Continue Psychosocial Services   No Follow up required       Vocational Rehabilitation: Provide vocational rehab assistance to qualifying candidates.   Vocational Rehab Evaluation & Intervention: Vocational Rehab - 01/10/17 1405      Initial Vocational Rehab Evaluation & Intervention   Assessment shows need for Vocational  Rehabilitation  No       Education: Education Goals: Education classes will be provided on a weekly basis, covering required topics. Participant will state understanding/return demonstration of topics presented.  Learning Barriers/Preferences: Learning Barriers/Preferences - 01/10/17 0851      Learning Barriers/Preferences   Learning Barriers  Sight    Learning Preferences  Written Material       Education Topics: Count Your Pulse:  -Group instruction provided by verbal instruction, demonstration, patient participation and written materials to support subject.  Instructors address importance of being able to find your pulse and how to count your pulse when at home without a heart monitor.  Patients get hands on experience counting their pulse with staff help and individually.   Heart Attack, Angina, and Risk Factor Modification:  -Group instruction provided by verbal instruction, video, and written materials to support subject.  Instructors address signs and symptoms of angina and heart attacks.    Also discuss risk factors for heart disease and how to make changes to improve heart health risk factors.   Functional Fitness:  -Group instruction provided by verbal instruction, demonstration, patient participation, and written materials to support subject.  Instructors address safety measures for doing things around the house.  Discuss how to get up and down off the floor, how to pick things up properly, how to safely get out of a chair without assistance, and balance training.   Meditation and Mindfulness:  -Group instruction provided by verbal instruction, patient participation, and written materials to support subject.  Instructor addresses importance of mindfulness and meditation practice to help reduce stress and improve awareness.  Instructor also leads participants through a meditation exercise.    Stretching for Flexibility and  Mobility:  -Group instruction provided by verbal  instruction, patient participation, and written materials to support subject.  Instructors lead participants through series of stretches that are designed to increase flexibility thus improving mobility.  These stretches are additional exercise for major muscle groups that are typically performed during regular warm up and cool down.   CARDIAC REHAB PHASE II EXERCISE from 03/08/2017 in [place]  Date  02/01/17  Instruction Review Code  2- meets goals/outcomes      Hands Only CPR:  -Group verbal, video, and participation provides a basic overview of AHA guidelines for community CPR. Role-play of emergencies allow participants the opportunity to practice calling for help and chest compression technique with discussion of AED use.   Hypertension: -Group verbal and written instruction that provides a basic overview of hypertension including the most recent diagnostic guidelines, risk factor reduction with self-care instructions and medication management.    Nutrition I class: Heart Healthy Eating:  -Group instruction provided by PowerPoint slides, verbal discussion, and written materials to support subject matter. The instructor gives an explanation and review of the Therapeutic Lifestyle Changes diet recommendations, which includes a discussion on lipid goals, dietary fat, sodium, fiber, plant stanol/sterol esters, sugar, and the components of a well-balanced, healthy diet.   CARDIAC REHAB PHASE II EXERCISE from 03/08/2017 in [place]  Date  03/05/17  Educator  RD  Instruction Review Code  2- meets goals/outcomes      Nutrition II class: Lifestyle Skills:  -Group instruction provided by PowerPoint slides, verbal discussion, and written materials to support subject matter. The instructor gives an explanation and review of label reading, grocery shopping for heart health, heart healthy recipe modifications, and ways to make  healthier choices when eating out.   Diabetes Question & Answer:  -Group instruction provided by PowerPoint slides, verbal discussion, and written materials to support subject matter. The instructor gives an explanation and review of diabetes co-morbidities, pre- and post-prandial blood glucose goals, pre-exercise blood glucose goals, signs, symptoms, and treatment of hypoglycemia and hyperglycemia, and foot care basics.   Diabetes Blitz:  -Group instruction provided by PowerPoint slides, verbal discussion, and written materials to support subject matter. The instructor gives an explanation and review of the physiology behind type 1 and type 2 diabetes, diabetes medications and rational behind using different medications, pre- and post-prandial blood glucose recommendations and Hemoglobin A1c goals, diabetes diet, and exercise including blood glucose guidelines for exercising safely.    Portion Distortion:  -Group instruction provided by PowerPoint slides, verbal discussion, written materials, and food models to support subject matter. The instructor gives an explanation of serving size versus portion size, changes in portions sizes over the last 20 years, and what consists of a serving from each food group.   Stress Management:  -Group instruction provided by verbal instruction, video, and written materials to support subject matter.  Instructors review role of stress in heart disease and how to cope with stress positively.     Exercising on Your Own:  -Group instruction provided by verbal instruction, power point, and written materials to support subject.  Instructors discuss benefits of exercise, components of exercise, frequency and intensity of exercise, and end points for exercise.  Also discuss use of nitroglycerin and activating EMS.  Review options of places to exercise outside of rehab.  Review guidelines for sex with heart disease.   Cardiac Drugs I:  -Group instruction provided by  verbal instruction and written materials  to support subject.  Instructor reviews cardiac drug classes: antiplatelets, anticoagulants, beta blockers, and statins.  Instructor discusses reasons, side effects, and lifestyle considerations for each drug class.   Cardiac Drugs II:  -Group instruction provided by verbal instruction and written materials to support subject.  Instructor reviews cardiac drug classes: angiotensin converting enzyme inhibitors (ACE-I), angiotensin II receptor blockers (ARBs), nitrates, and calcium channel blockers.  Instructor discusses reasons, side effects, and lifestyle considerations for each drug class.   Anatomy and Physiology of the Circulatory System:  Group verbal and written instruction and models provide basic cardiac anatomy and physiology, with the coronary electrical and arterial systems. Review of: AMI, Angina, Valve disease, Heart Failure, Peripheral Artery Disease, Cardiac Arrhythmia, Pacemakers, and the ICD.   Other Education:  -Group or individual verbal, written, or video instructions that support the educational goals of the cardiac rehab program.   CARDIAC REHAB PHASE II EXERCISE from 03/08/2017 in [place]  Date  03/08/17 [Holiday Eating Survival Tips]  Educator  RD  Instruction Review Code  2- Demonstrated Understanding      Knowledge Questionnaire Score: Knowledge Questionnaire Score - 01/10/17 1144      Knowledge Questionnaire Score   Pre Score  23/24       Core Components/Risk Factors/Patient Goals at Admission: Personal Goals and Risk Factors at Admission - 01/10/17 1204      Core Components/Risk Factors/Patient Goals on Admission   Hypertension  Yes    Intervention  Provide education on lifestyle modifcations including regular physical activity/exercise, weight management, moderate sodium restriction and increased consumption of fresh fruit, vegetables, and low fat dairy, alcohol moderation, and  smoking cessation.;Monitor prescription use compliance.    Expected Outcomes  Short Term: Continued assessment and intervention until BP is < 140/62m HG in hypertensive participants. < 130/846mHG in hypertensive participants with diabetes, heart failure or chronic kidney disease.;Long Term: Maintenance of blood pressure at goal levels.    Lipids  Yes    Intervention  Provide education and support for participant on nutrition & aerobic/resistive exercise along with prescribed medications to achieve LDL <703mHDL >61m55m  Expected Outcomes  Short Term: Participant states understanding of desired cholesterol values and is compliant with medications prescribed. Participant is following exercise prescription and nutrition guidelines.;Long Term: Cholesterol controlled with medications as prescribed, with individualized exercise RX and with personalized nutrition plan. Value goals: LDL < 70mg57mL > 40 mg.       Core Components/Risk Factors/Patient Goals Review:  Goals and Risk Factor Review    Row Name 02/06/17 1156 03/01/17 1408 03/29/17 1706         Core Components/Risk Factors/Patient Goals Review   Personal Goals Review  Lipids;Hypertension  Lipids;Hypertension  Lipids;Hypertension     Review  Dakota Martin's vital signs have been stable at cardiac rehab  Dakota Martin's vital signs have been stable at cardiac rehab  Dakota Martin's vital signs have been stable at cardiac rehab     Expected Outcomes  BarryAlvester Martin continue to exercise at cardiac rehab and take his medicaitons as presribed  BarryAlvester Martin continue to exercise at cardiac rehab and take his medicaitons as presribed  BarryAlvester Martin continue to exercise at cardiac rehab and take his medicaitons as presribed        Core Components/Risk Factors/Patient Goals at Discharge (Final Review):  Goals and Risk Factor Review - 03/29/17 1706      Core Components/Risk Factors/Patient Goals Review   Personal Goals Review  Lipids;Hypertension  Review  Dakota Martin's vital signs  have been stable at cardiac rehab    Expected Outcomes  Dakota Martin will continue to exercise at cardiac rehab and take his medicaitons as presribed       ITP Comments: ITP Comments    Row Name 01/10/17 0848 03/29/17 1706         ITP Comments  Medical Director, Dr. Fransico Him  30 day ITP review. Patient with good participation and attendance at cardiac rehab.         Comments: See ITP comments.Barnet Pall, RN,BSN 03/29/2017 5:09 PM

## 2017-04-01 ENCOUNTER — Encounter (HOSPITAL_COMMUNITY)
Admission: RE | Admit: 2017-04-01 | Discharge: 2017-04-01 | Disposition: A | Discharge status: Home / Self Care | Payer: Medicare Other | Source: Ambulatory Visit | Attending: Cardiovascular Disease | Admitting: Cardiovascular Disease

## 2017-04-01 DIAGNOSIS — R011 Cardiac murmur, unspecified: Secondary | ICD-10-CM | POA: Insufficient documentation

## 2017-04-01 DIAGNOSIS — Z952 Presence of prosthetic heart valve: Secondary | ICD-10-CM | POA: Insufficient documentation

## 2017-04-01 DIAGNOSIS — I10 Essential (primary) hypertension: Secondary | ICD-10-CM | POA: Diagnosis not present

## 2017-04-01 DIAGNOSIS — M109 Gout, unspecified: Secondary | ICD-10-CM | POA: Insufficient documentation

## 2017-04-01 DIAGNOSIS — E785 Hyperlipidemia, unspecified: Secondary | ICD-10-CM | POA: Diagnosis not present

## 2017-04-01 DIAGNOSIS — Z950 Presence of cardiac pacemaker: Secondary | ICD-10-CM | POA: Diagnosis not present

## 2017-04-01 DIAGNOSIS — Z951 Presence of aortocoronary bypass graft: Secondary | ICD-10-CM | POA: Insufficient documentation

## 2017-04-03 ENCOUNTER — Encounter (HOSPITAL_COMMUNITY)
Admission: RE | Admit: 2017-04-03 | Discharge: 2017-04-03 | Disposition: A | Discharge status: Home / Self Care | Payer: Medicare Other | Source: Ambulatory Visit | Attending: Cardiovascular Disease | Admitting: Cardiovascular Disease

## 2017-04-03 DIAGNOSIS — Z951 Presence of aortocoronary bypass graft: Secondary | ICD-10-CM | POA: Diagnosis not present

## 2017-04-03 DIAGNOSIS — Z952 Presence of prosthetic heart valve: Secondary | ICD-10-CM

## 2017-04-05 ENCOUNTER — Encounter (HOSPITAL_COMMUNITY)
Admission: RE | Admit: 2017-04-05 | Discharge: 2017-04-05 | Disposition: A | Discharge status: Home / Self Care | Payer: Medicare Other | Source: Ambulatory Visit | Attending: Cardiovascular Disease | Admitting: Cardiovascular Disease

## 2017-04-05 VITALS — BP 136/82 | HR 86 | Ht 69.0 in | Wt 168.0 lb

## 2017-04-05 DIAGNOSIS — Z951 Presence of aortocoronary bypass graft: Secondary | ICD-10-CM

## 2017-04-05 DIAGNOSIS — Z950 Presence of cardiac pacemaker: Secondary | ICD-10-CM

## 2017-04-05 DIAGNOSIS — Z952 Presence of prosthetic heart valve: Secondary | ICD-10-CM

## 2017-04-08 ENCOUNTER — Encounter (HOSPITAL_COMMUNITY): Payer: Medicare Other

## 2017-04-09 NOTE — Progress Notes (Signed)
Discharge Progress Report  Patient Details  Name: Dakota Martin MRN: [id number] Date of Birth: November 01, 1940 Referring Provider:     [facility] from 01/10/2017 in [place]  Referring Provider  Mertie Moores MD       Number of Visits: 36  Reason for Discharge:  Patient independent in their exercise.  Smoking History:  Social History   Tobacco Use  Smoking Status Never Smoker  Smokeless Tobacco Never Used    Diagnosis:  S/P AVR (aortic valve replacement)  S/P CABG x 2  Pacemaker  ADL [facility]:   Initial Exercise Prescription: Initial Exercise Prescription - 01/10/17 1100      Date of Initial Exercise RX and Referring Provider   Date  01/10/17    Referring Provider  Nahser, Arnette Norris MD      Bike   Level  0.7    Minutes  10    METs  2.76      NuStep   Level  3    SPM  80    Minutes  10    METs  2.5      Track   Laps  10    Minutes  10    METs  2.74      Prescription Details   Frequency (times per week)  3    Duration  Progress to 30 minutes of continuous aerobic without signs/symptoms of physical distress      Intensity   THRR 40-80% of Max Heartrate  58-116    Ratings of Perceived Exertion  11-13    Perceived Dyspnea  0-4      Progression   Progression  Continue to progress workloads to maintain intensity without signs/symptoms of physical distress.      Resistance Training   Training Prescription  Yes    Weight  3lbs    Reps  10-15       Discharge Exercise Prescription (Final Exercise Prescription Changes): Exercise Prescription Changes - 04/05/17 0949      Response to Exercise   Blood Pressure (Admit)  136/82    Blood Pressure (Exercise)  126/66    Blood Pressure (Exit)  106/74    Heart Rate (Admit)  86 bpm    Heart Rate (Exercise)  130 bpm    Heart Rate (Exit)  92 bpm    Rating of Perceived Exertion (Exercise)  12    Symptoms  none    Duration  Continue with 30 min of  aerobic exercise without signs/symptoms of physical distress.    Intensity  THRR unchanged      Progression   Progression  Continue to progress workloads to maintain intensity without signs/symptoms of physical distress.    Average METs  4.1      Resistance Training   Training Prescription  Yes    Weight  4lbs    Reps  10-15    Time  10 Minutes      Interval Training   Interval Training  No      Bike   Level  1.4    Minutes  10    METs  4.45      NuStep   Level  6    SPM  80    Minutes  10    METs  4.2      Track   Laps  15    Minutes  10    METs  3.61      Home  Exercise Plan   Plans to continue exercise at  Home (comment)    Frequency  Add 4 additional days to program exercise sessions.    Initial Home Exercises Provided  01/21/17       Functional Capacity: 6 Minute Walk    Row Name 01/10/17 1153 04/01/17 1024       6 Minute Walk   Phase  Initial  Discharge    Distance  1478 feet  1735 feet    Distance % Change  -  17.39 %    Walk Time  6 minutes  6 minutes    # of Rest Breaks  0  0    MPH  2.8  3.29    METS  3.21  4.04    RPE  11  11    VO2 Peak  11.24  14.15    Symptoms  No  No    Resting HR  90 bpm  80 bpm    Resting BP  110/50  132/70    Resting Oxygen Saturation   98 %  -    Exercise Oxygen Saturation  during 6 min walk  100 %  -    Max Ex. HR  108 bpm  121 bpm    Max Ex. BP  118/58  160/70    2 Minute Post BP  90/50 given banana/drinking water recheck 98/60  124/70       Psychological, QOL, Others - Outcomes: PHQ 2/9: Depression screen [facility] 2/9 04/11/2017 01/14/2017 08/19/2015 01/12/2015  Decreased Interest 0 0 0 0  Down, Depressed, Hopeless 0 0 0 0  PHQ - 2 Score 0 0 0 0    Quality of Life: Quality of Life - 04/03/17 1654      Quality of Life Scores   Health/Function Pre  25.5 %    Health/Function Post  26.87 %    Health/Function % Change  5.37 %    Socioeconomic Pre  27.86 %    Socioeconomic Post  26.43 %    Socioeconomic % Change    -5.13 %    Psych/Spiritual Pre  26.79 %    Psych/Spiritual Post  26.5 %    Psych/Spiritual % Change  -1.08 %    Family Pre  30 %    Family Post  28.8 %    Family % Change  -4 %    GLOBAL Pre  26.91 %    GLOBAL Post  26.99 %    GLOBAL % Change  0.3 %       Personal Goals: Goals established at orientation with interventions provided to work toward goal. Personal Goals and Risk Factors at Admission - 01/10/17 1204      Core Components/Risk Factors/Patient Goals on Admission   Hypertension  Yes    Intervention  Provide education on lifestyle modifcations including regular physical activity/exercise, weight management, moderate sodium restriction and increased consumption of fresh fruit, vegetables, and low fat dairy, alcohol moderation, and smoking cessation.;Monitor prescription use compliance.    Expected Outcomes  Short Term: Continued assessment and intervention until BP is < 140/65m HG in hypertensive participants. < 130/880mHG in hypertensive participants with diabetes, heart failure or chronic kidney disease.;Long Term: Maintenance of blood pressure at goal levels.    Lipids  Yes    Intervention  Provide education and support for participant on nutrition & aerobic/resistive exercise along with prescribed medications to achieve LDL <7039mHDL >78m87m  Expected Outcomes  Short Term: Participant states  understanding of desired cholesterol values and is compliant with medications prescribed. Participant is following exercise prescription and nutrition guidelines.;Long Term: Cholesterol controlled with medications as prescribed, with individualized exercise RX and with personalized nutrition plan. Value goals: LDL < 61m, HDL > 40 mg.        Personal Goals Discharge: Goals and Risk Factor Review    Row Name 02/06/17 1156 03/01/17 1408 03/29/17 1706         Core Components/Risk Factors/Patient Goals Review   Personal Goals Review  Lipids;Hypertension  Lipids;Hypertension   Lipids;Hypertension     Review  Barry's vital signs have been stable at cardiac rehab  Barry's vital signs have been stable at cardiac rehab  Barry's vital signs have been stable at cardiac rehab     Expected Outcomes  BAlvester Chouwill continue to exercise at cardiac rehab and take his medicaitons as presribed  BAlvester Chouwill continue to exercise at cardiac rehab and take his medicaitons as presribed  BAlvester Chouwill continue to exercise at cardiac rehab and take his medicaitons as presribed        Exercise Goals and Review: Exercise Goals    Row Name 01/10/17 0854             Exercise Goals   Increase Physical Activity  Yes return to yardwork       Intervention  Provide advice, education, support and counseling about physical activity/exercise needs.;Develop an individualized exercise prescription for aerobic and resistive training based on initial evaluation findings, risk stratification, comorbidities and participant's personal goals.       Expected Outcomes  Achievement of increased cardiorespiratory fitness and enhanced flexibility, muscular endurance and strength shown through measurements of functional capacity and personal statement of participant.       Increase Strength and Stamina  Yes return to playing tennis and return to gym like routine       Intervention  Provide advice, education, support and counseling about physical activity/exercise needs.;Develop an individualized exercise prescription for aerobic and resistive training based on initial evaluation findings, risk stratification, comorbidities and participant's personal goals.       Expected Outcomes  Achievement of increased cardiorespiratory fitness and enhanced flexibility, muscular endurance and strength shown through measurements of functional capacity and personal statement of participant.       Able to understand and use rate of perceived exertion (RPE) scale  Yes       Intervention  Provide education and explanation on how to use  RPE scale       Expected Outcomes  Short Term: Able to use RPE daily in rehab to express subjective intensity level;Long Term:  Able to use RPE to guide intensity level when exercising independently       Knowledge and understanding of Target Heart Rate Range (THRR)  Yes       Intervention  Provide education and explanation of THRR including how the numbers were predicted and where they are located for reference       Expected Outcomes  Short Term: Able to state/look up THRR;Long Term: Able to use THRR to govern intensity when exercising independently;Short Term: Able to use daily as guideline for intensity in rehab       Able to check pulse independently  Yes       Intervention  Provide education and demonstration on how to check pulse in carotid and radial arteries.;Review the importance of being able to check your own pulse for safety during independent exercise  Expected Outcomes  Short Term: Able to explain why pulse checking is important during independent exercise;Long Term: Able to check pulse independently and accurately       Understanding of Exercise Prescription  Yes       Intervention  Provide education, explanation, and written materials on patient's individual exercise prescription       Expected Outcomes  Short Term: Able to explain program exercise prescription;Long Term: Able to explain home exercise prescription to exercise independently          Nutrition & Weight - Outcomes: Pre Biometrics - 01/10/17 1204      Pre Biometrics   Waist Circumference  37 inches    Hip Circumference  35.75 inches    Waist to Hip Ratio  1.03 %    Triceps Skinfold  15 mm    % Body Fat  24.9 %    Grip Strength  40 kg    Flexibility  0 in    Single Leg Stand  24 seconds      Post Biometrics - 04/05/17 0949       Post  Biometrics   Height  5' 9"  (1.753 m)    Weight  167 lb 15.9 oz (76.2 kg)    Waist Circumference  36.25 inches    Hip Circumference  38 inches    Waist to Hip Ratio   0.95 %    BMI (Calculated)  24.8    Triceps Skinfold  11 mm    % Body Fat  23.6 %    Grip Strength  41 kg    Flexibility  8 in    Single Leg Stand  10.68 seconds       Nutrition: Nutrition Therapy & Goals - 01/10/17 1234      Nutrition Therapy   Diet  Therapeutic Lifestyle Changes    Drug/Food Interactions  Coumadin/Vit K      Personal Nutrition Goals   Nutrition Goal  Pt to describe the benefit of including fruits, vegetables, whole grains, and low-fat dairy products in a heart healthy meal plan.      Intervention Plan   Intervention  Prescribe, educate and counsel regarding individualized specific dietary modifications aiming towards targeted core components such as weight, hypertension, lipid management, diabetes, heart failure and other comorbidities.    Expected Outcomes  Short Term Goal: Understand basic principles of dietary content, such as calories, fat, sodium, cholesterol and nutrients.;Long Term Goal: Adherence to prescribed nutrition plan.       Nutrition Discharge: Nutrition Assessments - 01/10/17 1229      MEDFICTS Scores   Pre Score  33       Education Questionnaire Score: Knowledge Questionnaire Score - 04/03/17 1653      Knowledge Questionnaire Score   Pre Score  23/24    Post Score  24/24       Goals reviewed with patient; copy given to patient.[name] graduated from cardiac rehab program today with completion of 36 exercise sessions in Phase II. Pt maintained good attendance and progressed nicely during his participation in rehab as evidenced by increased MET level.   Medication list reconciled. Repeat  PHQ score-0  .  Pt has made significant lifestyle changes and should be commended for his success. Pt feels he has achieved his goals during cardiac rehab.   Pt plans to continue exercise by playing tennis twice a week. Working with a Physiological scientist and walking in the park. We are proud of Barry's progress. [name] increased his  distance on his post  exercise walk test..Maria Venetia Maxon, RN,BSN 04/19/2017 9:17 AM

## 2017-04-10 ENCOUNTER — Encounter (HOSPITAL_COMMUNITY): Payer: Medicare Other

## 2017-04-12 ENCOUNTER — Encounter (HOSPITAL_COMMUNITY): Payer: Medicare Other

## 2017-04-15 ENCOUNTER — Encounter (HOSPITAL_COMMUNITY): Payer: Medicare Other

## 2017-04-17 ENCOUNTER — Encounter (HOSPITAL_COMMUNITY): Payer: Medicare Other

## 2017-04-29 ENCOUNTER — Ambulatory Visit
Admission: RE | Admit: 2017-04-29 | Discharge: 2017-04-29 | Disposition: A | Discharge status: Home / Self Care | Payer: Medicare Other | Source: Ambulatory Visit | Attending: Sports Medicine | Admitting: Sports Medicine

## 2017-04-29 ENCOUNTER — Encounter: Payer: Self-pay | Admitting: Sports Medicine

## 2017-04-29 ENCOUNTER — Ambulatory Visit: Payer: Medicare Other | Admitting: Sports Medicine

## 2017-04-29 VITALS — BP 151/73 | Ht 69.0 in | Wt 165.0 lb

## 2017-04-29 DIAGNOSIS — M25511 Pain in right shoulder: Secondary | ICD-10-CM | POA: Diagnosis not present

## 2017-04-29 DIAGNOSIS — G8929 Other chronic pain: Secondary | ICD-10-CM

## 2017-04-29 DIAGNOSIS — M19011 Primary osteoarthritis, right shoulder: Secondary | ICD-10-CM

## 2017-04-29 NOTE — Progress Notes (Signed)
   Subjective:    Patient ID: Dakota Martin, male    DOB: 29-Jan-1941, 76 y.o.   MRN: [id number]  HPI chief complaint: Right shoulder pain  Patient comes in today complaining of right shoulder pain. He's had shoulder pain for years. In fact, workup by myself in March 2015 revealed some glenohumeral DJD and a partial thickness rotator cuff tear which was seen on ultrasound. At the time he was on Coumadin and was not interested in anything surgical but he has since undergone a second cardiac valve replacement which does not require anticoagulation. His pain is diffuse in the shoulder and is present primarily after activity. He was using a leaf blower yesterday and had significant discomfort last night. He also notices pain if he puts his arm in certain positions such as abduction or external rotation. He does occasionally get nighttime pain. He takes 200 mg of ibuprofen which does help. He has been working on a limited basis with physical therapy but does not think that has been helpful. He denies any recent trauma. No numbness or tingling. No neck pain.  Past medical history reviewed Medications reviewed Allergies reviewed    Review of Systems As above    Objective:   Physical Exam  Well-developed, well-nourished. No acute distress. Awake alert and oriented 3. Vital signs reviewed  Right shoulder: Active forward flexion and abduction to 100. External rotation is 80. Internal rotation 60. All planes of motion are painful. Passive external rotation is limited compared to the left shoulder and reproduces pain. His rotator cuff strength is 5/5 and only slightly reproducible pain with resisted supraspinatus. He has no tenderness over the acromioclavicular joint. No tenderness over the bicipital groove. No gross deformity. No swelling. Neurovascularly intact distally.      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 He may be interested in meeting with one of the orthopedists to discuss surgical options. Phone follow-up after I review his updated x-rays and we will discuss further treatment options at that time.

## 2017-05-03 ENCOUNTER — Telehealth: Payer: Self-pay | Admitting: Sports Medicine

## 2017-05-03 NOTE — Telephone Encounter (Signed)
  I spoke with Dakota Martin earlier this week after reviewing the x-ray of his right shoulder. X-ray shows further advancement of his glenohumeral DJD. He would like to discuss surgical options. He would also like to talk to a friend of his that had a shoulder replacement so that I can refer him to this surgeon. He will call me back with the name of the surgeon that he prefers to see.

## 2017-06-12 ENCOUNTER — Ambulatory Visit (INDEPENDENT_AMBULATORY_CARE_PROVIDER_SITE_OTHER): Payer: Medicare Other | Admitting: *Deleted

## 2017-06-12 DIAGNOSIS — I442 Atrioventricular block, complete: Secondary | ICD-10-CM | POA: Diagnosis not present

## 2017-06-12 NOTE — Progress Notes (Signed)
Remote pacemaker transmission.   

## 2017-06-13 ENCOUNTER — Encounter: Payer: Self-pay | Admitting: Cardiology

## 2017-06-22 LAB — CUP PACEART REMOTE DEVICE CHECK
Brady Statistic AS VP Percent: 94.03 %
Brady Statistic AS VS Percent: 0.38 %
Brady Statistic RA Percent Paced: 5.76 %
Date Time Interrogation Session: 20190213051913
Implantable Lead Implant Date: 20180730
Implantable Lead Location: 753860
Implantable Lead Model: 5076
Implantable Pulse Generator Implant Date: 20180730
Lead Channel Impedance Value: 475 Ohm
Lead Channel Impedance Value: 570 Ohm
Lead Channel Pacing Threshold Pulse Width: 0.4 ms
Lead Channel Sensing Intrinsic Amplitude: 1.25 mV
Lead Channel Sensing Intrinsic Amplitude: 11.875 mV
Lead Channel Setting Pacing Amplitude: 1.75 V
Lead Channel Setting Pacing Pulse Width: 0.4 ms
Lead Channel Setting Sensing Sensitivity: 4 mV
MDC IDC LEAD IMPLANT DT: 20180730
MDC IDC LEAD LOCATION: 753859
MDC IDC MSMT BATTERY REMAINING LONGEVITY: 138 mo
MDC IDC MSMT BATTERY VOLTAGE: 3.09 V
MDC IDC MSMT LEADCHNL RA IMPEDANCE VALUE: 304 Ohm
MDC IDC MSMT LEADCHNL RA PACING THRESHOLD AMPLITUDE: 0.875 V
MDC IDC MSMT LEADCHNL RA SENSING INTR AMPL: 1.25 mV
MDC IDC MSMT LEADCHNL RV IMPEDANCE VALUE: 627 Ohm
MDC IDC MSMT LEADCHNL RV PACING THRESHOLD AMPLITUDE: 0.5 V
MDC IDC MSMT LEADCHNL RV PACING THRESHOLD PULSEWIDTH: 0.4 ms
MDC IDC MSMT LEADCHNL RV SENSING INTR AMPL: 11.875 mV
MDC IDC SET LEADCHNL RV PACING AMPLITUDE: 2.5 V
MDC IDC STAT BRADY AP VP PERCENT: 5.58 %
MDC IDC STAT BRADY AP VS PERCENT: 0 %
MDC IDC STAT BRADY RV PERCENT PACED: 99.61 %

## 2017-09-11 ENCOUNTER — Ambulatory Visit (INDEPENDENT_AMBULATORY_CARE_PROVIDER_SITE_OTHER): Payer: Medicare Other | Admitting: *Deleted

## 2017-09-11 ENCOUNTER — Telehealth: Payer: Self-pay | Admitting: Cardiology

## 2017-09-11 DIAGNOSIS — I442 Atrioventricular block, complete: Secondary | ICD-10-CM | POA: Diagnosis not present

## 2017-09-11 NOTE — Telephone Encounter (Signed)
Spoke with pt and reminded pt of remote transmission that is due today. Pt verbalized understanding.   

## 2017-09-12 NOTE — Progress Notes (Signed)
Remote pacemaker transmission.   

## 2017-09-13 ENCOUNTER — Encounter: Payer: Self-pay | Admitting: Cardiology

## 2017-09-20 LAB — CUP PACEART REMOTE DEVICE CHECK
Battery Remaining Longevity: 134 mo
Brady Statistic AP VS Percent: 0 %
Brady Statistic AS VP Percent: 90.67 %
Brady Statistic RA Percent Paced: 9.1 %
Brady Statistic RV Percent Paced: 99.58 %
Date Time Interrogation Session: 20190515164344
Implantable Lead Location: 753859
Implantable Lead Location: 753860
Implantable Lead Model: 5076
Implantable Pulse Generator Implant Date: 20180730
Lead Channel Impedance Value: 589 Ohm
Lead Channel Pacing Threshold Pulse Width: 0.4 ms
Lead Channel Sensing Intrinsic Amplitude: 1.25 mV
Lead Channel Sensing Intrinsic Amplitude: 12.375 mV
Lead Channel Sensing Intrinsic Amplitude: 12.375 mV
Lead Channel Setting Pacing Amplitude: 2.5 V
Lead Channel Setting Pacing Pulse Width: 0.4 ms
Lead Channel Setting Sensing Sensitivity: 4 mV
MDC IDC LEAD IMPLANT DT: 20180730
MDC IDC LEAD IMPLANT DT: 20180730
MDC IDC MSMT BATTERY VOLTAGE: 3.05 V
MDC IDC MSMT LEADCHNL RA IMPEDANCE VALUE: 304 Ohm
MDC IDC MSMT LEADCHNL RA IMPEDANCE VALUE: 532 Ohm
MDC IDC MSMT LEADCHNL RA PACING THRESHOLD AMPLITUDE: 0.75 V
MDC IDC MSMT LEADCHNL RA SENSING INTR AMPL: 1.25 mV
MDC IDC MSMT LEADCHNL RV IMPEDANCE VALUE: 494 Ohm
MDC IDC MSMT LEADCHNL RV PACING THRESHOLD AMPLITUDE: 0.375 V
MDC IDC MSMT LEADCHNL RV PACING THRESHOLD PULSEWIDTH: 0.4 ms
MDC IDC SET LEADCHNL RA PACING AMPLITUDE: 1.5 V
MDC IDC STAT BRADY AP VP PERCENT: 8.91 %
MDC IDC STAT BRADY AS VS PERCENT: 0.42 %

## 2017-10-17 ENCOUNTER — Other Ambulatory Visit: Payer: Self-pay | Admitting: Cardiovascular Disease

## 2017-10-18 ENCOUNTER — Other Ambulatory Visit: Payer: Self-pay | Admitting: Thoracic Surgery (Cardiothoracic Vascular Surgery)

## 2017-10-18 DIAGNOSIS — I712 Thoracic aortic aneurysm, without rupture, unspecified: Secondary | ICD-10-CM

## 2017-11-19 ENCOUNTER — Ambulatory Visit
Admission: RE | Admit: 2017-11-19 | Discharge: 2017-11-19 | Disposition: A | Discharge status: Home / Self Care | Payer: Medicare Other | Source: Ambulatory Visit | Attending: Thoracic Surgery (Cardiothoracic Vascular Surgery) | Admitting: Thoracic Surgery (Cardiothoracic Vascular Surgery)

## 2017-11-19 ENCOUNTER — Encounter: Payer: Self-pay | Admitting: Thoracic Surgery (Cardiothoracic Vascular Surgery)

## 2017-11-19 ENCOUNTER — Ambulatory Visit: Payer: Medicare Other | Admitting: Thoracic Surgery (Cardiothoracic Vascular Surgery)

## 2017-11-19 VITALS — BP 138/60 | HR 66 | Resp 16 | Ht 69.0 in | Wt 163.0 lb

## 2017-11-19 DIAGNOSIS — Z9889 Other specified postprocedural states: Secondary | ICD-10-CM | POA: Diagnosis not present

## 2017-11-19 DIAGNOSIS — Z953 Presence of xenogenic heart valve: Secondary | ICD-10-CM

## 2017-11-19 DIAGNOSIS — I712 Thoracic aortic aneurysm, without rupture, unspecified: Secondary | ICD-10-CM

## 2017-11-19 DIAGNOSIS — Z8679 Personal history of other diseases of the circulatory system: Secondary | ICD-10-CM

## 2017-11-19 MED ORDER — IOPAMIDOL (ISOVUE-370) INJECTION 76%
75.0000 mL | Freq: Once | INTRAVENOUS | Status: AC | PRN
  Administered 2017-11-19: 75 mL via INTRAVENOUS

## 2017-11-19 NOTE — Progress Notes (Signed)
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  He developed an ascending aneurysm.  I followed him for a few years and then he had a significant interval growth over 6 months.  In 2018.  I did a redo sternotomy, redo AVR with a bovine pericardial valve, repair of an ascending aneurysm and coronary bypass grafting x2 in July 2018.  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  I last saw him in the office in September 2018.  He was doing well at that time.  He says he is doing well.  He is not having any chest pain, pressure, or tightness.  He does not have any shortness of breath with exertion.  He does not have any residual pain from his redo sternotomy.  Past Medical History:  Diagnosis Date  . Aortic valve disease   . Arthritis   . 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 Colon polyp   . Gout   . Heart murmur    as a younger person,   . Hyperlipidemia   . Hypertension   . 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 S/P aortic valve replacement with metallic valve 70/26/3785   Due to congenital aortic stenosis // [name] mechanical valve done in 1982 // Echo 09/10/16: Mild focal basal septal hypertrophy, [name], [name], [name], mod aortic root enlargement, aortic root 36 mm, ascending aorta 49 mm, [name], [name]    Current Outpatient Medications  Medication Sig Dispense Refill  . allopurinol (ZYLOPRIM) 300 MG tablet Take 300 mg by mouth daily at 12 noon.     [name] Kitchen amoxicillin (AMOXIL)  500 MG capsule Take 2,000 mg by mouth See admin instructions. Take 4 capsules (2000 mg) by mouth 1 hour prior to dental procedures.    [name] Kitchen aspirin EC 81 MG EC tablet Take 1 tablet (81 mg total) by mouth daily.    [name] Kitchen atorvastatin (LIPITOR) 20 MG tablet Take 10 mg daily by mouth.    . carvedilol (COREG) 6.25 MG tablet TAKE 1 TABLET (6.25 MG) BY MOUTH TWO  TIMES DAILY WITH BREAKFAST & WITH SUPPER 180 tablet 0  . cholecalciferol (VITAMIN D) 1000 units tablet Take 5,000 Units by mouth daily with breakfast.     . Coenzyme Q10 50 MG CAPS Take 50 mg by mouth daily with breakfast.    . Cyanocobalamin (VITAMIN B-12) 5000 MCG SUBL Place 5,000 mcg under the tongue 2 (two) times a week. Wednesday & Saturday.    . ezetimibe (ZETIA) 10 MG tablet Take 5 mg by mouth daily. In the morning.    . fish oil-omega-3 fatty acids 1000 MG capsule Take 1 g by mouth 2 (two) times daily.     [name] Kitchen ibuprofen (ADVIL,MOTRIN) 200 MG tablet Take 200 mg by mouth as needed.    . Magnesium 500 MG CAPS Take 500 mg by mouth every evening.     [name] Kitchen telmisartan (MICARDIS)  80 MG tablet Take 80 mg by mouth daily. In the morning.  11   No current facility-administered medications for this visit.     Physical Exam BP 138/60 (BP Location: Right Arm, Patient Position: Sitting, Cuff Size: Normal)   Pulse 66   Resp 16   Ht _0  (1.753 m)   Wt 163 lb (73.9 kg)   SpO2 96% Comment: ON RA  BMI 24.59 kg/m  77 year old man in no acute distress Alert and oriented x3 with no focal deficits Lungs clear bilaterally Cardiac regular rate and rhythm normal S1 and S2, no rubs or murmurs No peripheral edema  Diagnostic Tests: CT ANGIOGRAPHY CHEST WITH CONTRAST  TECHNIQUE: Multidetector CT imaging of the chest was performed using the standard protocol during bolus administration of intravenous contrast. Multiplanar CT image reconstructions and MIPs were obtained to evaluate the vascular anatomy.  CONTRAST:  31m ISOVUE-370 IOPAMIDOL (ISOVUE-370)  INJECTION 76%  [facility] [address]  Results: Creatinine 1.1 mg/dL.  COMPARISON:  08/28/2016  FINDINGS: Cardiovascular: There are interval changes consistent with ascending aortic repair and aortic valve replacement. The ascending aorta now shows a normal caliber measuring 3.3 cm in greatest AP dimension at the level of the pulmonary artery. Changes of coronary bypass grafting are noted. The graft to the left anterior descending artery is patent proximally. The graft to the posterior descending artery is not well visualized and may be occluded. Coronary calcifications are noted. No sizable pulmonary emboli are seen. Thoracic aortic arch and descending aorta are within normal limits. No dissection is identified. No significant cardiac enlargement is noted. Pacing device is again seen.  Mediastinum/Nodes: Thoracic inlet is within normal limits. No hilar or mediastinal adenopathy is seen. The esophagus as visualized is within normal limits.  Lungs/Pleura: Lungs are well aerated bilaterally. No focal infiltrate or sizable effusion is seen. A few scattered calcifications are noted consistent with prior granulomatous disease and stable from the prior exam. No new focal nodular densities are seen.  Upper Abdomen: Multiple hepatic cysts are noted stable from the previous exam. Scattered splenic granulomas are again seen. No other focal abnormality is noted.  Musculoskeletal: Degenerative changes of the thoracic spine are noted. Changes of prior median sternotomy are noted. No compression deformities are noted.  Review of the MIP images confirms the above findings.  IMPRESSION: Status post ascending aortic aneurysm repair and aortic valve repair. The ascending aortic caliber is now within normal limits. The distal native ascending aorta is stable in appearance.  Changes consistent with coronary bypass grafting.  The graft to the left anterior descending coronary artery appears patent. The graft to the posterior descending coronary artery is not well visualized and may be occluded.  Prior granulomatous disease.  No acute abnormality noted.  Aortic Atherosclerosis (ICD10-I70.0).   Electronically Signed   By: MInez CatalinaM.D.   On: 11/19/2017 13:45 I personally reviewed the CT images.  Concur with the findings regarding the aorta.  Doubt significance of the coronary findings in a study that was not specifically time to address coronary flow.  Impression: Mr. LCreanis a 77year old gentleman who had a redo sternotomy for redo AVR with bioprosthetic valve, repair of an ascending aneurysm, and coronary bypass grafting x2 a year ago.  He is doing well 1 year out from surgery with no cardiac symptoms.  His distal ascending aorta beyond the graft measures about 3.8 cm which is within the normal range for a  gentleman of his age.  He does have extensive aortic atherosclerotic disease and is on Lipitor.  Hypertension-blood pressure well controlled on current regimen.  Start pressure slightly higher than ideal, but no need for intervention at this time.  He does have a cuff at home and I recommended he check himself periodically just to make sure that it is not trending upward.  Plan: Return in 1 year with PA and lateral chest x-ray.  I will plan to do another CT, this time without contrast in 2 years  Melrose Nakayama, MD [facility] [phone]

## 2017-12-12 ENCOUNTER — Ambulatory Visit (INDEPENDENT_AMBULATORY_CARE_PROVIDER_SITE_OTHER): Payer: Medicare Other | Admitting: *Deleted

## 2017-12-12 DIAGNOSIS — I442 Atrioventricular block, complete: Secondary | ICD-10-CM

## 2017-12-12 NOTE — Progress Notes (Signed)
Remote pacemaker transmission.   

## 2018-01-01 ENCOUNTER — Ambulatory Visit: Payer: Medicare Other | Admitting: Internal Medicine

## 2018-01-01 ENCOUNTER — Encounter: Payer: Self-pay | Admitting: Internal Medicine

## 2018-01-01 VITALS — BP 126/70 | HR 70 | Ht 69.0 in | Wt 175.8 lb

## 2018-01-01 DIAGNOSIS — I442 Atrioventricular block, complete: Secondary | ICD-10-CM

## 2018-01-01 DIAGNOSIS — Z954 Presence of other heart-valve replacement: Secondary | ICD-10-CM

## 2018-01-01 DIAGNOSIS — Z950 Presence of cardiac pacemaker: Secondary | ICD-10-CM | POA: Diagnosis not present

## 2018-01-01 NOTE — Progress Notes (Signed)
HPI Mr. Nine returns today for ongoing evaluation and management of complete heart block, status post permanent pacemaker insertion. This all occurred in the setting of aortic valve replacement. In the interim the patient has done well. He denies chest pain or shortness of breath. He admits to some dietary indiscretion with sodium. No syncope. He remains active playing tennis. No Known Allergies   Current Outpatient Medications  Medication Sig Dispense Refill  . allopurinol (ZYLOPRIM) 300 MG tablet Take 300 mg by mouth daily at 12 noon.     [name] Kitchen amoxicillin (AMOXIL) 500 MG capsule Take 2,000 mg by mouth See admin instructions. Take 4 capsules (2000 mg) by mouth 1 hour prior to dental procedures.    [name] Kitchen aspirin EC 81 MG EC tablet Take 1 tablet (81 mg total) by mouth daily.    [name] Kitchen atorvastatin (LIPITOR) 20 MG tablet Take 10 mg daily by mouth.    . carvedilol (COREG) 6.25 MG tablet TAKE 1 TABLET (6.25 MG) BY MOUTH TWO  TIMES DAILY WITH BREAKFAST & WITH SUPPER 180 tablet 0  . cholecalciferol (VITAMIN D) 1000 units tablet Take 5,000 Units by mouth daily with breakfast.     . Coenzyme Q10 50 MG CAPS Take 50 mg by mouth daily with breakfast.    . Cyanocobalamin (VITAMIN B-12) 5000 MCG SUBL Place 5,000 mcg under the tongue 2 (two) times a week. Wednesday & Saturday.    . ezetimibe (ZETIA) 10 MG tablet Take 5 mg by mouth daily. In the morning.    . fish oil-omega-3 fatty acids 1000 MG capsule Take 1 g by mouth 2 (two) times daily.     [name] Kitchen ibuprofen (ADVIL,MOTRIN) 200 MG tablet Take 200 mg by mouth as needed.    . Magnesium 500 MG CAPS Take 500 mg by mouth every evening.     [name] Kitchen telmisartan (MICARDIS) 80 MG tablet Take 80 mg by mouth daily. In the morning.  11   No current facility-administered medications for this visit.      Past Medical History:  Diagnosis Date  . Aortic valve disease   . Arthritis   . 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 Colon polyp   . Gout   . Heart murmur    as a younger person,   . Hyperlipidemia   . Hypertension   . 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 S/P aortic valve replacement with metallic valve 56/38/7564   Due to congenital aortic stenosis // [name] mechanical valve done in 1982 // Echo 09/10/16: Mild focal basal septal hypertrophy, [name], [name], [name], mod aortic root enlargement, aortic root 36 mm, ascending aorta 49 mm, [name], [name]    ROS:   All systems reviewed and negative except as noted in the HPI.   Past Surgical History:  Procedure Laterality Date  . Aortic valve  1982   prosthetic  val ve  . AORTIC VALVE REPLACEMENT N/A 11/21/2016   Procedure: REDO AORTIC VALVE REPLACEMENT (AVR);  Surgeon: Melrose Nakayama, MD;  Location: [facility];  Service: Open Heart Surgery;  Laterality: N/A;  . CARDIAC CATHETERIZATION    . CORONARY ARTERY BYPASS GRAFT N/A 11/21/2016   Procedure: CORONARY ARTERY BYPASS GRAFTING (CABG) x  2, using right leg greater saphenous vein harvested endoscopically;  Surgeon: Melrose Nakayama, MD;  Location: [facility];  Service: Open Heart Surgery;  Laterality: N/A;  . cyst removed  1976   right side of neck  . PACEMAKER IMPLANT N/A 11/26/2016   Procedure: Pacemaker Implant;  Surgeon: Deboraha Sprang, MD;  Location: [facility];  Service: Cardiovascular;  Laterality: N/A;  . TEE WITHOUT CARDIOVERSION N/A 11/21/2016   Procedure: TRANSESOPHAGEAL ECHOCARDIOGRAM (TEE);  Surgeon: Melrose Nakayama, MD;  Location: [facility];  Service: Open Heart Surgery;  Laterality: N/A;  . THORACIC AORTIC ANEURYSM REPAIR N/A 11/21/2016   Procedure: THORACIC ASCENDING ANEURYSM REPAIR (AAA);  Surgeon: Melrose Nakayama, MD;  Location: [facility];  Service: Open Heart  Surgery;  Laterality: N/A;  . TONSILLECTOMY       Family History  Problem Relation Age of Onset  . Hyperlipidemia Mother   . Colon cancer Mother   . Emphysema Father   . Other Father        respiratory failure  . Diabetes Maternal Grandfather   . Colon polyps Neg Hx   . Kidney disease Neg Hx   . Esophageal cancer Neg Hx   . Gallbladder disease Neg Hx      Social History   Socioeconomic History  . Marital status: Married    Spouse name: Not on file  . Number of children: 2  . Years of education: Not on file  . Highest education level: Not on file  Occupational History  . Occupation: Retired  Scientific laboratory technician  . Financial resource strain: Not on file  . Food insecurity:    Worry: Not on file    Inability: Not on file  . Transportation needs:    Medical: Not on file    Non-medical: Not on file  Tobacco Use  . Smoking status: Never Smoker  . Smokeless tobacco: Never Used  Substance and Sexual Activity  . Alcohol use: Yes    Alcohol/week: 0.0 standard drinks    Comment: 2 drinks a day  . Drug use: No  . Sexual activity: Not on file  Lifestyle  . Physical activity:    Days per week: Not on file    Minutes per session: Not on file  . Stress: Not on file  Relationships  . Social connections:    Talks on phone: Not on file    Gets together: Not on file    Attends religious service: Not on file    Active member of club or organization: Not on file    Attends meetings of clubs or organizations: Not on file    Relationship status: Not on file  . Intimate partner violence:    Fear of current or ex partner: Not on file    Emotionally abused: Not on file    Physically abused: Not on file    Forced sexual activity: Not on file  Other Topics Concern  . 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, clinical   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   Gym 1x wk   Yoga 1 x wk     BP 126/70   Pulse 70   Ht _0  (1.753 m)   Wt 175 lb 12.8 oz  (79.7 kg)   SpO2 97%   BMI 25.96 kg/m   Physical Exam:  Well appearing NAD HEENT: Unremarkable Neck:  No JVD, no thyromegally Lymphatics:  No adenopathy  Back:  No CVA tenderness Lungs:  Clear HEART:  Regular rate rhythm, no murmurs, no rubs, no clicks Abd:  soft, positive bowel sounds, no organomegally, no rebound, no guarding Ext:  2 plus pulses, no edema, no cyanosis, no clubbing Skin:  No rashes no nodules Neuro:  CN II through XII intact, motor grossly intact  EKG - NSR with p synchronous ventricular pacing  DEVICE  Normal device function.  See PaceArt for details.   Assess/Plan: 1. CHB - he is asymptomatic, s/p PPM insertion 2. PPM - his medtronic dDD PM is working normally. We will recheck in several months. 3. HTN - his blood pressure is well controlled. He will continue his current meds.  Mikle Bosworth.D.

## 2018-01-01 NOTE — Patient Instructions (Signed)
Medication Instructions:  Your physician recommends that you continue on your current medications as directed. Please refer to the Current Medication list given to you today.  Labwork: None ordered.  Testing/Procedures: None ordered.  Follow-Up: Your physician wants you to follow-up in: one year with Dr. Lovena Le.   You will receive a reminder letter in the mail two months in advance. If you don't receive a letter, please call our office to schedule the follow-up appointment.  Remote monitoring is used to monitor your Pacemaker from home. This monitoring reduces the number of office visits required to check your device to one time per year. It allows [place] to keep an eye on the functioning of your device to ensure it is working properly. You are scheduled for a device check from home on 03/13/2018. You may send your transmission at any time that day. If you have a wireless device, the transmission will be sent automatically. After your physician reviews your transmission, you will receive a postcard with your next transmission date.  Any Other Special Instructions Will Be Listed Below (If Applicable).  If you need a refill on your cardiac medications before your next appointment, please call your pharmacy.

## 2018-01-07 ENCOUNTER — Other Ambulatory Visit: Payer: Self-pay | Admitting: Cardiovascular Disease

## 2018-01-20 LAB — CUP PACEART REMOTE DEVICE CHECK
Brady Statistic AP VP Percent: 9.17 %
Brady Statistic AP VS Percent: 0 %
Brady Statistic AS VS Percent: 0.38 %
Brady Statistic RA Percent Paced: 9.34 %
Brady Statistic RV Percent Paced: 99.62 %
Implantable Lead Implant Date: 20180730
Implantable Lead Implant Date: 20180730
Implantable Lead Location: 753859
Implantable Pulse Generator Implant Date: 20180730
Lead Channel Impedance Value: 513 Ohm
Lead Channel Impedance Value: 532 Ohm
Lead Channel Impedance Value: 589 Ohm
Lead Channel Pacing Threshold Amplitude: 0.375 V
Lead Channel Sensing Intrinsic Amplitude: 16.75 mV
Lead Channel Setting Pacing Amplitude: 1.5 V
Lead Channel Setting Pacing Amplitude: 2.5 V
Lead Channel Setting Pacing Pulse Width: 0.4 ms
Lead Channel Setting Sensing Sensitivity: 4 mV
MDC IDC LEAD LOCATION: 753860
MDC IDC MSMT BATTERY REMAINING LONGEVITY: 131 mo
MDC IDC MSMT BATTERY VOLTAGE: 3.03 V
MDC IDC MSMT LEADCHNL RA IMPEDANCE VALUE: 304 Ohm
MDC IDC MSMT LEADCHNL RA PACING THRESHOLD AMPLITUDE: 0.75 V
MDC IDC MSMT LEADCHNL RA PACING THRESHOLD PULSEWIDTH: 0.4 ms
MDC IDC MSMT LEADCHNL RA SENSING INTR AMPL: 1.375 mV
MDC IDC MSMT LEADCHNL RA SENSING INTR AMPL: 1.375 mV
MDC IDC MSMT LEADCHNL RV PACING THRESHOLD PULSEWIDTH: 0.4 ms
MDC IDC MSMT LEADCHNL RV SENSING INTR AMPL: 16.75 mV
MDC IDC SESS DTM: 20190815111257
MDC IDC STAT BRADY AS VP PERCENT: 90.46 %

## 2018-02-13 ENCOUNTER — Other Ambulatory Visit: Payer: Self-pay | Admitting: Cardiovascular Disease

## 2018-02-13 MED ORDER — CARVEDILOL 6.25 MG PO TABS: ORAL_TABLET | ORAL | 0 refills | Status: DC

## 2018-02-13 NOTE — Telephone Encounter (Signed)
°*  STAT* If patient is at the pharmacy, call can be transferred to refill team.   1. Which medications need to be refilled? (please list name of each medication and dose if known) Pt has an appt on 03-14-18 with Dr Acie Fredrickson- Carvedilol  2. Which pharmacy/location (including street and city if local pharmacy) is medication to be sent to? [facility]  3. Do they need a 30 day or 90 day supply? 180 and refills

## 2018-02-13 NOTE — Telephone Encounter (Signed)
Pt's medication was sent to pt's pharmacy as requested. Confirmation received.  °

## 2018-03-13 ENCOUNTER — Telehealth: Payer: Self-pay | Admitting: Cardiology

## 2018-03-13 ENCOUNTER — Ambulatory Visit (INDEPENDENT_AMBULATORY_CARE_PROVIDER_SITE_OTHER): Payer: Medicare Other | Admitting: *Deleted

## 2018-03-13 DIAGNOSIS — I442 Atrioventricular block, complete: Secondary | ICD-10-CM | POA: Diagnosis not present

## 2018-03-13 NOTE — Telephone Encounter (Signed)
Spoke with pt and reminded pt of remote transmission that is due today. Pt verbalized understanding.   

## 2018-03-13 NOTE — Progress Notes (Signed)
Remote pacemaker transmission.   

## 2018-03-14 ENCOUNTER — Encounter: Payer: Self-pay | Admitting: Cardiovascular Disease

## 2018-03-14 ENCOUNTER — Ambulatory Visit: Payer: Medicare Other | Admitting: Cardiovascular Disease

## 2018-03-14 VITALS — BP 120/46 | HR 78 | Ht 69.0 in | Wt 172.1 lb

## 2018-03-14 DIAGNOSIS — Z952 Presence of prosthetic heart valve: Secondary | ICD-10-CM

## 2018-03-14 DIAGNOSIS — Z951 Presence of aortocoronary bypass graft: Secondary | ICD-10-CM | POA: Diagnosis not present

## 2018-03-14 MED ORDER — CARVEDILOL 6.25 MG PO TABS: 6.2500 mg | ORAL_TABLET | Freq: Two times a day (BID) | ORAL | 3 refills | Status: DC

## 2018-03-14 NOTE — Patient Instructions (Signed)
Medication Instructions:  Your physician recommends that you continue on your current medications as directed. Please refer to the Current Medication list given to you today.  If you need a refill on your cardiac medications before your next appointment, please call your pharmacy.   Lab work: None Ordered    Testing/Procedures: None Ordered   Follow-Up: At [facility], you and your health needs are our priority.  As part of our continuing mission to provide you with exceptional heart care, we have created designated Provider Care Teams.  These Care Teams include your primary Cardiologist (physician) and Advanced Practice Providers (APPs -  Physician Assistants and Nurse Practitioners) who all work together to provide you with the care you need, when you need it. You will need a follow up appointment in:  1 years.  Please call our office 2 months in advance to schedule this appointment.  You may see Dr. Nahser or one of the following Advanced Practice Providers on your designated Care Team: Scott [place], PA-C Vin Bhagat, PA-C . Janine Hammond, NP    

## 2018-03-14 NOTE — Progress Notes (Signed)
Cardiology Office Note   Date:  03/14/2018   ID:  Dakota Martin, DOB 1940/11/18, MRN [id number]  PCP:  Dakota Infante, MD  Cardiologist:   Dakota Moores, MD   Chief Complaint  Patient presents with  . Hypertension  . Aortic Stenosis      August 17, 2014:  Dakota Martin is a 77 y.o. male who presents for follow-up of his essential hypertension and mild aortic root dilatation. We had HCTZ and Kdur at his last visit.  BP readings have been well controlled.    Expand All Collapse All     Problem List: 1. 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   2. Chronic coumadin therapy.  3. Hypertension 4. Hyperlipidemia 5. 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 He has been doing well. No CP , no dyspnea.  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   Oct. 27, 2015:  Dakota Martin is seen back for a scheduled visit. He had a recent echo that revealed a dilated aortic root ( 50 cm)     Jan. 19, 2016:  Dakota Martin  is seen back today for follow-up of his aortic valve replacement and history of hypertension. He has a dilated aortic root. Also has a history of hypertension and hyperlipidemia. He has been recording his BP daily. He has substituted generic Micardis ( Telmisartan)  for his Benicar  August 17, 2014:    Nov. 4, 2016:  Dakota Martin is seen back today for follow-up visit for his aortic valve replacement and aortic aneurysm. Doing great.  Feeling well.  Has seen Dr. Roxan Martin - the ascending aortic aneurism seems stable .  Playing tennis regularly  - plays 2-3 times a week .  Going for a right neck lymph node bx.  Bridging with Lovenox.  ( Managed by Medical Resource Manager )   Sep 14, 2015:  Doing well. Playing tennis . Lots of yard work . Had a lymph node bx last Nov.    Was benign.   Was cystic..  Drained it but it has returned.   Sept. 4, 2018:  Dakota Martin is doing great after his asc. Aortic aneurism repair / AVR ( bioprosthetic ) / CABG  Had a pacer placed later in the hospitalization.  Will follow up with Dr. Lovena Martin  Overall doing well.   His appetite has not fully recovered.  Has minimal chest wall tenderness.   The plan is to stop his coumadin for another month - INR levels are managed by MRM at Dakota Martin's office.  He asked about the statin   March 14, 2018:.  Is doing well after his a sending aortic aneurysm repair/aortic valve replacement.  He also has coronary artery bypass grafting.  He had a pacemaker placed later that hospitalization.  Active.  Plays tennis - 3 times a week .  No CP or dyspnea  Recent lipids  look great Chol = 115 HDL = 38 LDL = 62 Trigs = 73 His ALT was a little high.  He will follow-up with Dr. Joylene Martin on that.  Past Medical History:  Diagnosis Date  . Aortic valve disease   . Arthritis   . Ascending aortic aneurysm ([place]) 02/14/2016   5 cm in 2015 // Chest CT 08/28/16:  Ascending  thoracic aortic aneurysm 5.5 cm, Thoracic and abdominal aortic atherosclerosis, Remote granulomatous disease  . Colon polyp   . Gout   . Heart murmur    as a younger person,   . Hyperlipidemia   . Hypertension   . 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 S/P aortic valve replacement with metallic valve 73/53/2992   Due to congenital aortic stenosis // Dakota Martin mechanical valve done in 1982 // Echo 09/10/16: Mild focal basal septal hypertrophy, [name], [name], [name], mod aortic root enlargement, aortic root 36 mm, ascending aorta 49 mm, [name], [name]    Past Surgical History:  Procedure Laterality Date  . Aortic valve  1982   prosthetic  val ve  . AORTIC VALVE REPLACEMENT N/A 11/21/2016   Procedure: REDO AORTIC VALVE REPLACEMENT (AVR);   Surgeon: Dakota Nakayama, MD;  Location: [place];  Service: Open Heart Surgery;  Laterality: N/A;  . CARDIAC CATHETERIZATION    . CORONARY ARTERY BYPASS GRAFT N/A 11/21/2016   Procedure: CORONARY ARTERY BYPASS GRAFTING (CABG) x 2, using right leg greater saphenous vein harvested endoscopically;  Surgeon: Dakota Nakayama, MD;  Location: [place];  Service: Open Heart Surgery;  Laterality: N/A;  . cyst removed  1976   right side of neck  . PACEMAKER IMPLANT N/A 11/26/2016   Procedure: Pacemaker Implant;  Surgeon: Dakota Sprang, MD;  Location: [facility];  Service: Cardiovascular;  Laterality: N/A;  . TEE WITHOUT CARDIOVERSION N/A 11/21/2016   Procedure: TRANSESOPHAGEAL ECHOCARDIOGRAM (TEE);  Surgeon: Dakota Nakayama, MD;  Location: [facility];  Service: Open Heart Surgery;  Laterality: N/A;  . THORACIC AORTIC ANEURYSM REPAIR N/A 11/21/2016   Procedure: THORACIC ASCENDING ANEURYSM REPAIR (AAA);  Surgeon: Dakota Nakayama, MD;  Location: [facility];  Service: Open Heart Surgery;  Laterality: N/A;  . TONSILLECTOMY       Current Outpatient Medications  Medication Sig Dispense Refill  . allopurinol (ZYLOPRIM) 300 MG tablet Take 300 mg by mouth daily at 12 noon.     [name] Kitchen amoxicillin (AMOXIL) 500 MG capsule Take 2,000 mg by mouth See admin instructions. Take 4 capsules (2000 mg) by mouth 1 hour prior to dental procedures.    [name] Kitchen aspirin EC 81 MG EC tablet Take 1 tablet (81 mg total) by mouth daily.    [name] Kitchen atorvastatin (LIPITOR) 20 MG tablet Take 10 mg daily by mouth.    . carvedilol (COREG) 6.25 MG tablet Take 6.25 mg by mouth 2 (two) times daily with a meal.    . cholecalciferol (VITAMIN D) 1000 units tablet Take 5,000 Units by mouth. Wednesday AND SATURDAY    . Coenzyme Q10 50 MG CAPS Take 50 mg by mouth daily with breakfast.    . Cyanocobalamin (VITAMIN B-12) 5000 MCG SUBL Place 5,000 mcg under the tongue 2 (two) times a week. Wednesday & Saturday.    . ezetimibe (ZETIA) 10 MG tablet Take  5 mg by mouth daily. In the morning.    . fish oil-omega-3 fatty acids 1000 MG capsule Take 1 g by mouth 2 (two) times daily.     [name] Kitchen ibuprofen (ADVIL,MOTRIN) 200 MG tablet Take 200 mg by mouth as needed.    . Magnesium 500 MG CAPS Take 500 mg by mouth every evening.     [name] Kitchen telmisartan (MICARDIS) 80 MG tablet Take 80 mg by mouth daily. In the  morning.  11   No current facility-administered medications for this visit.     Allergies:   Patient has no known allergies.    Social History:  The patient  reports that he has never smoked. He has never used smokeless tobacco. He reports that he drinks alcohol. He reports that he does not use drugs.   Family History:  The patient's family history includes Colon cancer in his mother; Diabetes in his maternal grandfather; Emphysema in his father; Hyperlipidemia in his mother; Other in his father.    ROS:  Please see the history of present illness.   Otherwise, review of systems are positive for none.   All other systems are reviewed and negative.    Physical Exam: Blood pressure (!) 120/46, pulse 78, height 5' 9"  (1.753 m), weight 172 lb 1.9 oz (78.1 kg), SpO2 98 %.  GEN:  Well nourished, well developed in no acute distress HEENT: Normal NECK: No JVD; No carotid bruits LYMPHATICS: No lymphadenopathy CARDIAC: RR,  Soft systolic murmur  RESPIRATORY:  Clear to auscultation without rales, wheezing or rhonchi  ABDOMEN: Soft, non-tender, non-distended MUSCULOSKELETAL:  No edema; No deformity  SKIN: Warm and dry NEUROLOGIC:  Alert and oriented x 3    EKG:      Recent Labs: No results found for requested labs within last 8760 hours.    Lipid Panel No results found for: CHOL, TRIG, HDL, CHOLHDL, VLDL, LDLCALC, LDLDIRECT    Wt Readings from Last 3 Encounters:  03/14/18 172 lb 1.9 oz (78.1 kg)  01/01/18 175 lb 12.8 oz (79.7 kg)  11/19/17 163 lb (73.9 kg)      Other studies Reviewed:    ASSESSMENT AND PLAN:  1.  Essential  Hypertension:   - BP is well controlled  2. Aortic valve replacement:   Valve sound great   3. AV heart block:   - s/p pacer   4. Hyperlipidemia:   Recent labs from Dr. Silvestre Mesi office look great    Current medicines are reviewed at length with the patient today.  The patient does not have concerns regarding medicines.  Labs/ tests ordered today include:  No orders of the defined types were placed in this encounter.   Disposition:   FU with me  In 1 year     Dakota Moores, MD  03/14/2018 9:19 AM    [facility] [address], [place], [place]  [address] Phone: [phone]; Fax: [phone]

## 2018-05-12 LAB — CUP PACEART REMOTE DEVICE CHECK
Battery Voltage: 3.02 V
Brady Statistic AP VP Percent: 17.37 %
Brady Statistic AP VS Percent: 0 %
Brady Statistic AS VP Percent: 82.53 %
Brady Statistic AS VS Percent: 0.1 %
Brady Statistic RA Percent Paced: 17.34 %
Date Time Interrogation Session: 20191114052043
Implantable Lead Implant Date: 20180730
Implantable Lead Location: 753860
Implantable Lead Model: 5076
Lead Channel Impedance Value: 532 Ohm
Lead Channel Impedance Value: 532 Ohm
Lead Channel Pacing Threshold Amplitude: 0.375 V
Lead Channel Pacing Threshold Amplitude: 0.875 V
Lead Channel Pacing Threshold Pulse Width: 0.4 ms
Lead Channel Pacing Threshold Pulse Width: 0.4 ms
Lead Channel Sensing Intrinsic Amplitude: 16.75 mV
Lead Channel Sensing Intrinsic Amplitude: 16.75 mV
Lead Channel Setting Pacing Amplitude: 1.75 V
Lead Channel Setting Pacing Pulse Width: 0.4 ms
Lead Channel Setting Sensing Sensitivity: 4 mV
MDC IDC LEAD IMPLANT DT: 20180730
MDC IDC LEAD LOCATION: 753859
MDC IDC MSMT BATTERY REMAINING LONGEVITY: 128 mo
MDC IDC MSMT LEADCHNL RA IMPEDANCE VALUE: 304 Ohm
MDC IDC MSMT LEADCHNL RA SENSING INTR AMPL: 1.25 mV
MDC IDC MSMT LEADCHNL RA SENSING INTR AMPL: 1.25 mV
MDC IDC MSMT LEADCHNL RV IMPEDANCE VALUE: 589 Ohm
MDC IDC PG IMPLANT DT: 20180730
MDC IDC SET LEADCHNL RV PACING AMPLITUDE: 2.5 V
MDC IDC STAT BRADY RV PERCENT PACED: 99.9 %

## 2018-05-17 IMAGING — DX DG CHEST 2V
2 series · 2 of 2 positions shown · non-contrast
Comparison: Chest x-ray of 12/25/2016

CLINICAL DATA: CABG on 11/21/2016, followup

EXAM:
CHEST  2 VIEW

[dg chest 2 view (1 of 2)]
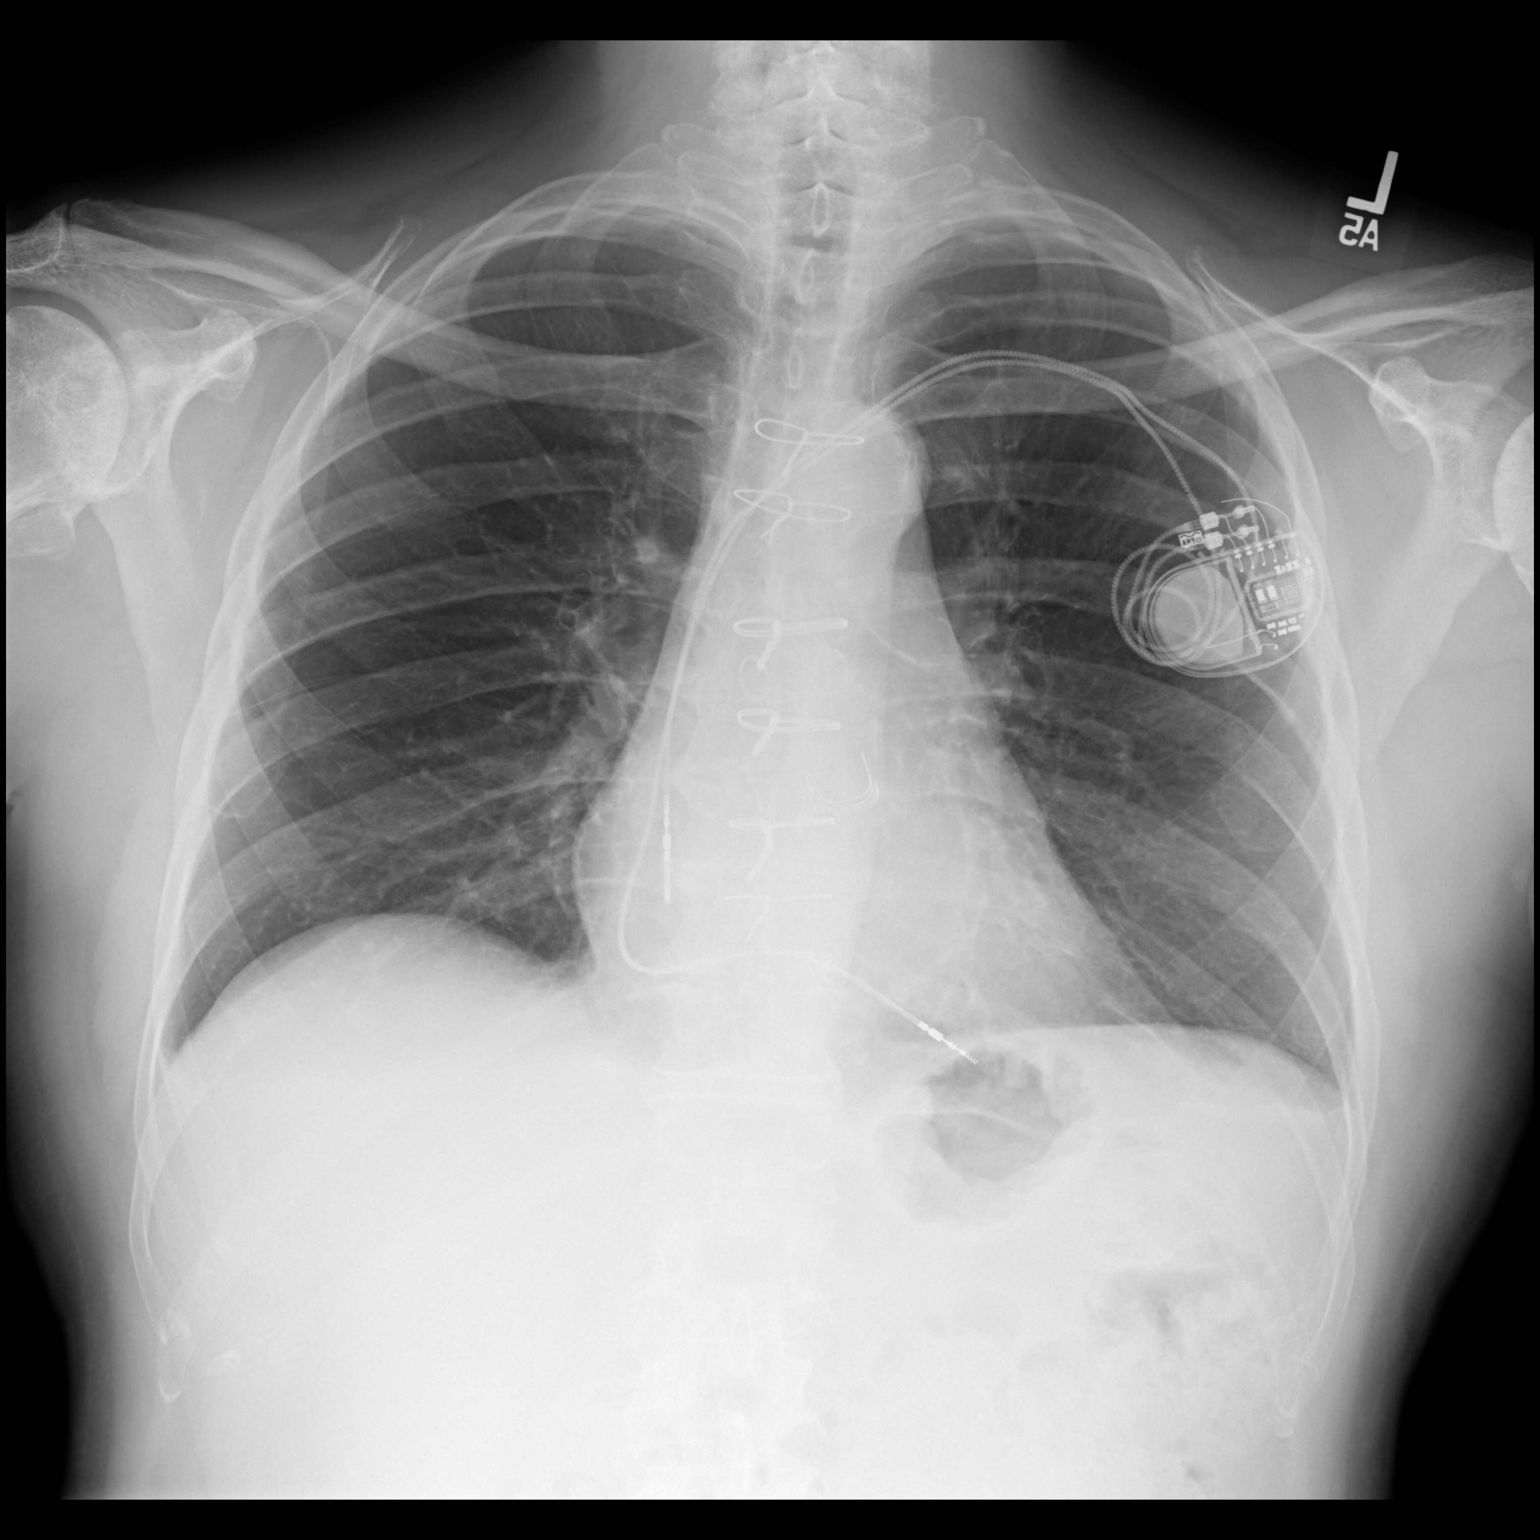

[dg chest 2 view (2 of 2)]
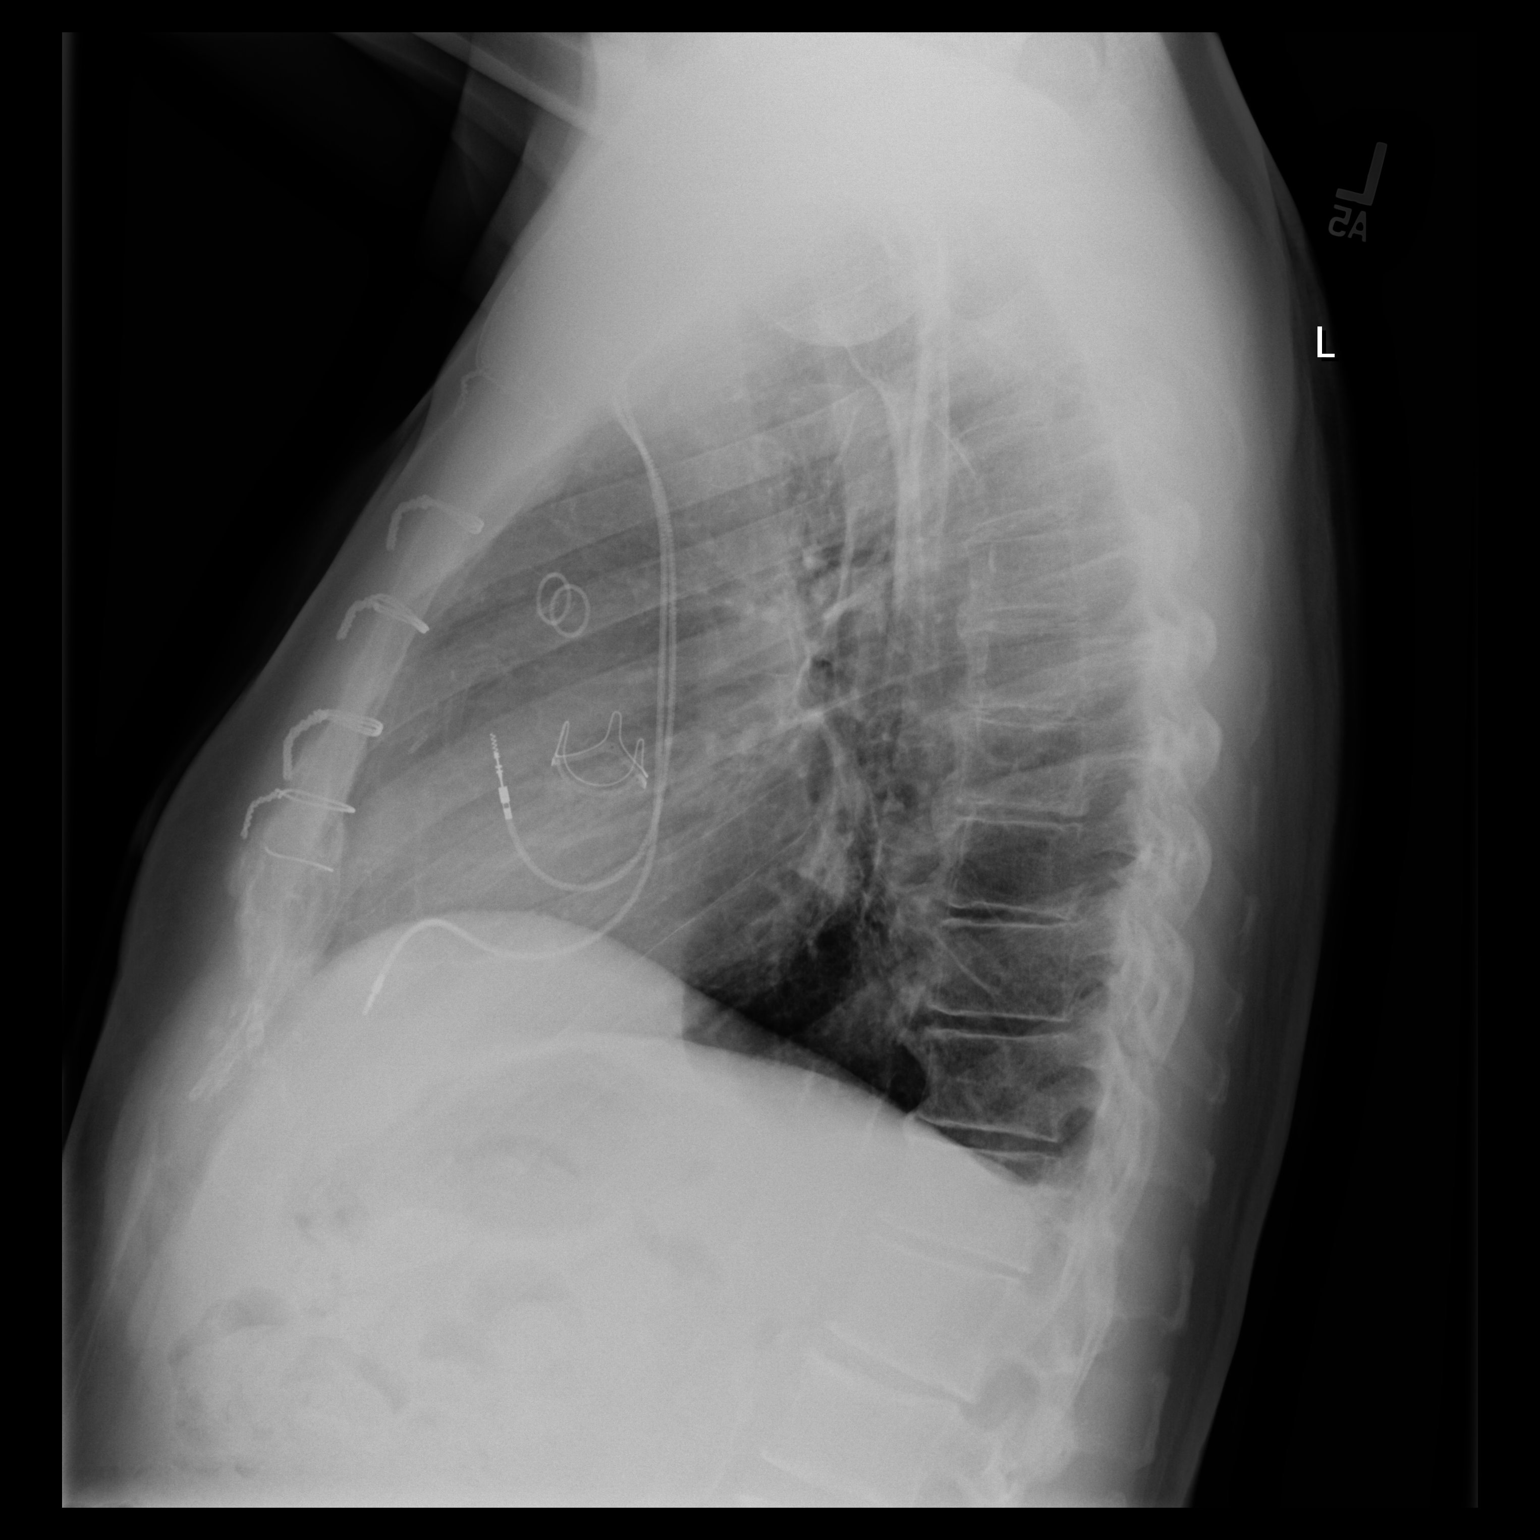

[2 of 2 positions shown; findings below may reference images not displayed]

FINDINGS: There may be very tiny pleural effusions slightly blunting the
posterior costophrenic angles but no significant pleural effusion is
seen. No pneumonia is noted. Mediastinal and hilar contours are
unchanged. The heart is mildly enlarged and stable. Median
sternotomy sutures are noted from prior CABG and aortic valve
replacement. Permanent pacemaker remains. There are degenerative
changes in the mid to lower thoracic spine.
IMPRESSION: 1. Only tiny pleural effusions remain.
2. No pneumonia.
3. Stable borderline cardiomegaly with pacemaker.

## 2018-06-09 DIAGNOSIS — M20011 Mallet finger of right finger(s): Secondary | ICD-10-CM | POA: Insufficient documentation

## 2018-06-12 ENCOUNTER — Ambulatory Visit (INDEPENDENT_AMBULATORY_CARE_PROVIDER_SITE_OTHER): Payer: Medicare Other

## 2018-06-12 DIAGNOSIS — I442 Atrioventricular block, complete: Secondary | ICD-10-CM

## 2018-06-13 LAB — CUP PACEART REMOTE DEVICE CHECK
Battery Voltage: 3.01 V
Brady Statistic AP VP Percent: 12.25 %
Brady Statistic AP VS Percent: 0 %
Brady Statistic AS VS Percent: 0.06 %
Brady Statistic RA Percent Paced: 12.22 %
Brady Statistic RV Percent Paced: 99.94 %
Implantable Lead Implant Date: 20180730
Implantable Lead Model: 5076
Lead Channel Impedance Value: 304 Ohm
Lead Channel Impedance Value: 494 Ohm
Lead Channel Impedance Value: 513 Ohm
Lead Channel Impedance Value: 570 Ohm
Lead Channel Pacing Threshold Amplitude: 0.375 V
Lead Channel Pacing Threshold Amplitude: 0.75 V
Lead Channel Pacing Threshold Pulse Width: 0.4 ms
Lead Channel Sensing Intrinsic Amplitude: 1.25 mV
Lead Channel Sensing Intrinsic Amplitude: 1.25 mV
Lead Channel Sensing Intrinsic Amplitude: 9.25 mV
MDC IDC LEAD IMPLANT DT: 20180730
MDC IDC LEAD LOCATION: 753859
MDC IDC LEAD LOCATION: 753860
MDC IDC MSMT BATTERY REMAINING LONGEVITY: 124 mo
MDC IDC MSMT LEADCHNL RA PACING THRESHOLD PULSEWIDTH: 0.4 ms
MDC IDC MSMT LEADCHNL RV SENSING INTR AMPL: 9.25 mV
MDC IDC PG IMPLANT DT: 20180730
MDC IDC SESS DTM: 20200213051724
MDC IDC SET LEADCHNL RA PACING AMPLITUDE: 1.5 V
MDC IDC SET LEADCHNL RV PACING AMPLITUDE: 2.5 V
MDC IDC SET LEADCHNL RV PACING PULSEWIDTH: 0.4 ms
MDC IDC SET LEADCHNL RV SENSING SENSITIVITY: 4 mV
MDC IDC STAT BRADY AS VP PERCENT: 87.68 %

## 2018-06-24 NOTE — Progress Notes (Signed)
Remote pacemaker transmission.   

## 2018-09-11 ENCOUNTER — Other Ambulatory Visit: Payer: Self-pay

## 2018-09-11 ENCOUNTER — Ambulatory Visit (INDEPENDENT_AMBULATORY_CARE_PROVIDER_SITE_OTHER): Payer: Medicare Other | Admitting: *Deleted

## 2018-09-11 DIAGNOSIS — I442 Atrioventricular block, complete: Secondary | ICD-10-CM

## 2018-09-11 LAB — CUP PACEART REMOTE DEVICE CHECK
Battery Remaining Longevity: 121 mo
Battery Voltage: 3.01 V
Brady Statistic AP VP Percent: 15.4 %
Brady Statistic AP VS Percent: 0 %
Brady Statistic AS VP Percent: 84.48 %
Brady Statistic AS VS Percent: 0.11 %
Brady Statistic RA Percent Paced: 15.35 %
Brady Statistic RV Percent Paced: 99.89 %
Date Time Interrogation Session: 20200514045759
Implantable Lead Implant Date: 20180730
Implantable Lead Implant Date: 20180730
Implantable Lead Location: 753859
Implantable Lead Location: 753860
Implantable Lead Model: 5076
Implantable Lead Model: 5076
Implantable Pulse Generator Implant Date: 20180730
Lead Channel Impedance Value: 304 Ohm
Lead Channel Impedance Value: 456 Ohm
Lead Channel Impedance Value: 513 Ohm
Lead Channel Impedance Value: 570 Ohm
Lead Channel Pacing Threshold Amplitude: 0.375 V
Lead Channel Pacing Threshold Amplitude: 0.5 V
Lead Channel Pacing Threshold Pulse Width: 0.4 ms
Lead Channel Pacing Threshold Pulse Width: 0.4 ms
Lead Channel Sensing Intrinsic Amplitude: 1.25 mV
Lead Channel Sensing Intrinsic Amplitude: 1.25 mV
Lead Channel Sensing Intrinsic Amplitude: 9.25 mV
Lead Channel Sensing Intrinsic Amplitude: 9.25 mV
Lead Channel Setting Pacing Amplitude: 1.5 V
Lead Channel Setting Pacing Amplitude: 2.5 V
Lead Channel Setting Pacing Pulse Width: 0.4 ms
Lead Channel Setting Sensing Sensitivity: 4 mV

## 2018-09-17 ENCOUNTER — Encounter: Payer: Self-pay | Admitting: Cardiology

## 2018-09-17 NOTE — Progress Notes (Signed)
Remote pacemaker transmission.   

## 2018-11-25 ENCOUNTER — Ambulatory Visit: Payer: Medicare Other | Admitting: Thoracic Surgery (Cardiothoracic Vascular Surgery)

## 2018-12-02 ENCOUNTER — Ambulatory Visit: Payer: Medicare Other | Admitting: Thoracic Surgery (Cardiothoracic Vascular Surgery)

## 2018-12-02 ENCOUNTER — Other Ambulatory Visit: Payer: Self-pay

## 2018-12-02 ENCOUNTER — Ambulatory Visit
Admission: RE | Admit: 2018-12-02 | Discharge: 2018-12-02 | Disposition: A | Discharge status: Home / Self Care | Payer: Medicare Other | Source: Ambulatory Visit | Attending: Thoracic Surgery (Cardiothoracic Vascular Surgery) | Admitting: Thoracic Surgery (Cardiothoracic Vascular Surgery)

## 2018-12-02 ENCOUNTER — Other Ambulatory Visit: Payer: Self-pay | Admitting: Thoracic Surgery (Cardiothoracic Vascular Surgery)

## 2018-12-02 ENCOUNTER — Encounter: Payer: Self-pay | Admitting: Thoracic Surgery (Cardiothoracic Vascular Surgery)

## 2018-12-02 VITALS — BP 174/80 | HR 68 | Temp 97.3°F | Resp 16 | Ht 69.0 in | Wt 168.0 lb

## 2018-12-02 DIAGNOSIS — Z952 Presence of prosthetic heart valve: Secondary | ICD-10-CM

## 2018-12-02 DIAGNOSIS — Z9889 Other specified postprocedural states: Secondary | ICD-10-CM | POA: Diagnosis not present

## 2018-12-02 DIAGNOSIS — Z8679 Personal history of other diseases of the circulatory system: Secondary | ICD-10-CM

## 2018-12-02 DIAGNOSIS — Z951 Presence of aortocoronary bypass graft: Secondary | ICD-10-CM

## 2018-12-02 NOTE — Progress Notes (Signed)
Dakota Martin [address]       [place] [address]             [phone]    HPI: Dakota Martin returns for a scheduled follow-up visit  Dakota Martin is a 78 year old man with a history of aortic valve replacement for aortic stenosis, ascending aneurysm, hypertension, aortic atherosclerosis, and hyperlipidemia.  He had an aortic valve replacement with a Bjork Shiley valve in 1982.  He developed an enlarging ascending aneurysm.  I did a redo AVR and repair of the ascending aneurysm and coronary artery bypass grafting x2 in July 2018.  We used a pericardial valve at that time.  He has been feeling well.  He is not having any chest pain, pressure, or tightness.  He does not have any unusual shortness of breath at rest or with exertion.  Past Medical History:  Diagnosis Date  . Aortic valve disease   . Arthritis   . Ascending aortic aneurysm ([place]) 02/14/2016   5 cm in 2015 // Chest CT 08/28/16:  Ascending thoracic aortic aneurysm 5.5 cm, Thoracic and abdominal aortic atherosclerosis, Remote granulomatous disease  . Colon polyp   . Gout   . Heart murmur    as a younger person,   . Hyperlipidemia   . Hypertension   . 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 S/P aortic valve replacement with metallic valve 79/39/0300   Due to congenital aortic stenosis // [name] mechanical valve done in 1982 // Echo 09/10/16: Mild focal basal septal hypertrophy, [name], [name], [name], mod aortic root enlargement, aortic root 36 mm, ascending aorta 49 mm, [name], [name]    Current Outpatient Medications  Medication Sig Dispense Refill  . allopurinol (ZYLOPRIM) 300 MG tablet Take 300 mg by mouth daily at 12 noon.     [name] Kitchen amoxicillin (AMOXIL) 500 MG capsule Take 2,000 mg by mouth See admin instructions. Take 4 capsules (2000 mg) by mouth 1 hour prior to dental procedures.    [name] Kitchen aspirin EC 81  MG EC tablet Take 1 tablet (81 mg total) by mouth daily.    [name] Kitchen atorvastatin (LIPITOR) 20 MG tablet Take 10 mg daily by mouth.    . carvedilol (COREG) 6.25 MG tablet Take 1 tablet (6.25 mg total) by mouth 2 (two) times daily with a meal. 180 tablet 3  . cholecalciferol (VITAMIN D) 1000 units tablet Take 5,000 Units by mouth. Wednesday AND SATURDAY    . Coenzyme Q10 50 MG CAPS Take 50 mg by mouth daily with breakfast.    . Cyanocobalamin (VITAMIN B-12) 5000 MCG SUBL Place 5,000 mcg under the tongue 2 (two) times a week. Wednesday & Saturday.    . ezetimibe (ZETIA) 10 MG tablet Take 5 mg by mouth daily. In the morning.    . fish oil-omega-3 fatty acids 1000 MG capsule Take 1 g by mouth 2 (two) times daily.     [name] Kitchen ibuprofen (ADVIL,MOTRIN) 200 MG tablet Take 200 mg by mouth as needed.    . Magnesium 500 MG CAPS Take 500 mg by mouth every evening.     [name] Kitchen telmisartan (MICARDIS) 80 MG tablet Take 80 mg by mouth daily. In the morning.  11   No current facility-administered medications for this visit.     Physical Exam BP (!) 174/80 (BP Location: Right Arm) Comment: MANUALLY  Pulse 68   Temp (!) 97.3 F (36.3 C) Comment: THERMAL  Resp 16   Ht _0  (1.753 m)   Wt 168 lb (76.2 kg)   SpO2 98% Comment: RA  BMI 24.78 kg/m  78 year old man in no acute distress Alert and oriented x3 with no focal deficits Lungs clear with good breath sounds bilaterally Cardiac regular rate and rhythm with a 2/6 systolic murmur No carotid bruits  Diagnostic Tests: CHEST - 2 VIEW  COMPARISON:  January 22, 2017  FINDINGS: A left-sided ICD seen with the lead tips at the right atrium and right ventricle. Overlying median sternotomy wires are present. The lungs are clear. No focal airspace consolidation or pleural effusion. The cardiomediastinal silhouette is otherwise unremarkable. There is atherosclerotic calcification the aortic knob . Degenerative changes seen in the midthoracic spine. No acute osseous  findings.  IMPRESSION: No acute cardiopulmonary process.   Electronically Signed   By: Prudencio Pair M.D.   On: 12/02/2018 15:12 I personally reviewed the chest x-ray images and concur with the findings noted above  Impression: Dakota Martin is a 78 year old man with a past history of aortic valve replacement for aortic stenosis, ascending aneurysm, hypertension, aortic atherosclerosis, and hyperlipidemia.  I did a redo aortic valve replacement with a bovine pericardial valve and repair of an ascending aneurysm in July 2018.  We also did CABG x2 at that time.  Overall he is doing extremely well at this time.  He is not having any anginal symptoms.  Ascending aneurysm-status post repair.  His aorta distal to the repair was about 3.8 to 3.9 cm on his CT last year.  He has borderline upper limits of normal for a gentleman his age group.  We will plan to do another CT next year just to make sure that is not becoming more aneurysmal.  Aortic atherosclerosis-on Lipitor  CAD-status post CABG x2, no anginal symptoms.  Hypertension-blood pressures significantly elevated today at 170 systolic.  He has a cuff at home but has not been checking himself on a regular basis.  I recommended that he check himself at different times of the day and different days of the week over the next couple of weeks.  If he sitting values above 017 systolic he will need adjustments to his blood pressure medication.  That would probably be best done by Dr. Haynes Kerns or Dr. Acie Fredrickson, but I will be happy to do that if needed.  Plan: Follow-up on blood pressure Return in 1 year with CT Angio of chest  Melrose Nakayama, MD [facility] [phone]

## 2018-12-04 ENCOUNTER — Telehealth: Payer: Self-pay

## 2018-12-04 NOTE — Telephone Encounter (Signed)
-----   Message from Melrose Nakayama, MD sent at 12/04/2018  2:14 PM EDT ----- Regarding: RE: BP Thanks  [employer] ----- Message ----- From: Donnella Sham, RN Sent: 12/04/2018   2:01 PM EDT To: Melrose Nakayama, MD Subject: BP                                             Hey,  Patient just wanted to make you aware that he re-checked his blood pressure twice today and has been much lower than when in the office with you Tuesday.  BP readings 134/71, 129/69.  Just FYI.  Thanks,  Caryl Pina

## 2018-12-11 ENCOUNTER — Ambulatory Visit (INDEPENDENT_AMBULATORY_CARE_PROVIDER_SITE_OTHER): Payer: Medicare Other | Admitting: *Deleted

## 2018-12-11 DIAGNOSIS — I442 Atrioventricular block, complete: Secondary | ICD-10-CM

## 2018-12-11 LAB — CUP PACEART REMOTE DEVICE CHECK
Battery Remaining Longevity: 117 mo
Battery Voltage: 3 V
Brady Statistic AP VP Percent: 23.81 %
Brady Statistic AP VS Percent: 0 %
Brady Statistic AS VP Percent: 76.07 %
Brady Statistic AS VS Percent: 0.12 %
Brady Statistic RA Percent Paced: 23.7 %
Brady Statistic RV Percent Paced: 99.88 %
Date Time Interrogation Session: 20200813132945
Implantable Lead Implant Date: 20180730
Implantable Lead Implant Date: 20180730
Implantable Lead Location: 753859
Implantable Lead Location: 753860
Implantable Lead Model: 5076
Implantable Lead Model: 5076
Implantable Pulse Generator Implant Date: 20180730
Lead Channel Impedance Value: 304 Ohm
Lead Channel Impedance Value: 437 Ohm
Lead Channel Impedance Value: 494 Ohm
Lead Channel Impedance Value: 551 Ohm
Lead Channel Pacing Threshold Amplitude: 0.375 V
Lead Channel Pacing Threshold Amplitude: 0.625 V
Lead Channel Pacing Threshold Pulse Width: 0.4 ms
Lead Channel Pacing Threshold Pulse Width: 0.4 ms
Lead Channel Sensing Intrinsic Amplitude: 1.25 mV
Lead Channel Sensing Intrinsic Amplitude: 9.25 mV
Lead Channel Setting Pacing Amplitude: 1.5 V
Lead Channel Setting Pacing Amplitude: 2.5 V
Lead Channel Setting Pacing Pulse Width: 0.4 ms
Lead Channel Setting Sensing Sensitivity: 4 mV

## 2018-12-22 ENCOUNTER — Encounter: Payer: Self-pay | Admitting: Cardiology

## 2018-12-22 NOTE — Progress Notes (Signed)
Remote pacemaker transmission.   

## 2019-03-07 ENCOUNTER — Other Ambulatory Visit: Payer: Self-pay | Admitting: Cardiovascular Disease

## 2019-03-12 ENCOUNTER — Ambulatory Visit (INDEPENDENT_AMBULATORY_CARE_PROVIDER_SITE_OTHER): Payer: Medicare Other | Admitting: *Deleted

## 2019-03-12 DIAGNOSIS — I442 Atrioventricular block, complete: Secondary | ICD-10-CM

## 2019-03-12 LAB — CUP PACEART REMOTE DEVICE CHECK
Battery Remaining Longevity: 114 mo
Battery Voltage: 3 V
Brady Statistic AP VP Percent: 27.89 %
Brady Statistic AP VS Percent: 0 %
Brady Statistic AS VP Percent: 72.02 %
Brady Statistic AS VS Percent: 0.09 %
Brady Statistic RA Percent Paced: 27.75 %
Brady Statistic RV Percent Paced: 99.91 %
Date Time Interrogation Session: 20201112045405
Implantable Lead Implant Date: 20180730
Implantable Lead Implant Date: 20180730
Implantable Lead Location: 753859
Implantable Lead Location: 753860
Implantable Lead Model: 5076
Implantable Lead Model: 5076
Implantable Pulse Generator Implant Date: 20180730
Lead Channel Impedance Value: 304 Ohm
Lead Channel Impedance Value: 456 Ohm
Lead Channel Impedance Value: 494 Ohm
Lead Channel Impedance Value: 551 Ohm
Lead Channel Pacing Threshold Amplitude: 0.5 V
Lead Channel Pacing Threshold Amplitude: 0.625 V
Lead Channel Pacing Threshold Pulse Width: 0.4 ms
Lead Channel Pacing Threshold Pulse Width: 0.4 ms
Lead Channel Sensing Intrinsic Amplitude: 1.25 mV
Lead Channel Sensing Intrinsic Amplitude: 1.25 mV
Lead Channel Sensing Intrinsic Amplitude: 9.25 mV
Lead Channel Sensing Intrinsic Amplitude: 9.25 mV
Lead Channel Setting Pacing Amplitude: 1.5 V
Lead Channel Setting Pacing Amplitude: 2.5 V
Lead Channel Setting Pacing Pulse Width: 0.4 ms
Lead Channel Setting Sensing Sensitivity: 4 mV

## 2019-04-03 NOTE — Progress Notes (Signed)
Remote pacemaker transmission.   

## 2019-04-08 ENCOUNTER — Encounter: Payer: Self-pay | Admitting: Cardiovascular Disease

## 2019-04-08 ENCOUNTER — Other Ambulatory Visit: Payer: Self-pay

## 2019-04-08 ENCOUNTER — Ambulatory Visit: Payer: Medicare Other | Admitting: Cardiovascular Disease

## 2019-04-08 VITALS — BP 146/60 | HR 66 | Ht 69.0 in | Wt 173.0 lb

## 2019-04-08 DIAGNOSIS — Z952 Presence of prosthetic heart valve: Secondary | ICD-10-CM | POA: Diagnosis not present

## 2019-04-08 DIAGNOSIS — Z954 Presence of other heart-valve replacement: Secondary | ICD-10-CM | POA: Diagnosis not present

## 2019-04-08 DIAGNOSIS — I442 Atrioventricular block, complete: Secondary | ICD-10-CM | POA: Diagnosis not present

## 2019-04-08 DIAGNOSIS — Z951 Presence of aortocoronary bypass graft: Secondary | ICD-10-CM | POA: Diagnosis not present

## 2019-04-08 NOTE — Progress Notes (Signed)
Cardiology Office Note   Date:  04/08/2019   ID:  Calton, Harshfield 01-07-1941, MRN [id number]  PCP:  Crist Infante, MD  Cardiologist:   Mertie Moores, MD   Chief Complaint  Patient presents with  . Coronary Artery Disease  . Hypertension      August 17, 2014:  Dakota Martin is a 78 y.o. male who presents for follow-up of his essential hypertension and mild aortic root dilatation. We had HCTZ and Kdur at his last visit.  BP readings have been well controlled.    Expand All Collapse All     Problem List: 1. 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   2. Chronic coumadin therapy.  3. Hypertension 4. Hyperlipidemia 5. 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 He has been doing well. No CP , no dyspnea.  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   Oct. 27, 2015:  Dakota Martin is seen back for a scheduled visit. He had a recent echo that revealed a dilated aortic root ( 50 cm)     Jan. 19, 2016:  Dakota Martin  is seen back today for follow-up of his aortic valve replacement and history of hypertension. He has a dilated aortic root. Also has a history of hypertension and hyperlipidemia. He has been recording his BP daily. He has substituted generic Micardis ( Telmisartan)  for his Benicar  August 17, 2014:    Nov. 4, 2016:  Dakota Martin is seen back today for follow-up visit for his aortic valve replacement and aortic aneurysm. Doing great.  Feeling well.  Has seen Dr. Roxan Hockey - the ascending aortic aneurism seems stable .  Playing tennis regularly  - plays 2-3 times a week .  Going for a right neck lymph node bx.  Bridging with Lovenox.  ( Managed by Medical Resource Manager )   Sep 14, 2015:  Doing well. Playing tennis . Lots of yard work . Had a lymph node bx  last Nov.   Was benign.   Was cystic..  Drained it but it has returned.   Sept. 4, 2018:  Dakota Martin is doing great after his asc. Aortic aneurism repair / AVR ( bioprosthetic ) / CABG  Had a pacer placed later in the hospitalization.  Will follow up with Dr. Lovena Le  Overall doing well.   His appetite has not fully recovered.  Has minimal chest wall tenderness.   The plan is to stop his coumadin for another month - INR levels are managed by MRM at [facility] office.  He asked about the statin   March 14, 2018:.  Is doing well after his a sending aortic aneurysm repair/aortic valve replacement.  He also has coronary artery bypass grafting.  He had a pacemaker placed later that hospitalization.  Active.  Plays tennis - 3 times a week .  No CP or dyspnea  Recent lipids  look great Chol = 115 HDL = 38 LDL = 62 Trigs = 73 His ALT was a little high.  He will follow-up with Dr. Joylene Draft on that.  Dec. 9, 2020    Doing well. No cp or dyspnea Plays indoor tennis ( has been trying to wear his mask )  No CP or dyspnea.   Labs from Dr.  Parini's office March 24, 2019: Cholesterol is 138.  HDL is 49.  LDL 74.  Triglyceride level 77.  Hemoglobin is 15.1.  Creatinine is 1.1. BP is well controlled at home Watches his salt intake   Past Medical History:  Diagnosis Date  . Aortic valve disease   . Arthritis   . Ascending aortic aneurysm ([place]) 02/14/2016   5 cm in 2015 // Chest CT 08/28/16:  Ascending thoracic aortic aneurysm 5.5 cm, Thoracic and abdominal aortic atherosclerosis, Remote granulomatous disease  . Colon polyp   . Gout   . Heart murmur    as a younger person,   . Hyperlipidemia   . Hypertension   . 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 S/P aortic valve replacement with metallic valve 27/74/1287   Due to congenital aortic stenosis // [name] mechanical valve done in 1982 // Echo 09/10/16: Mild focal basal septal hypertrophy, EF  55-60, [name], [name], mod aortic root enlargement, aortic root 36 mm, ascending aorta 49 mm, [name], [name]    Past Surgical History:  Procedure Laterality Date  . Aortic valve  1982   prosthetic  val ve  . AORTIC VALVE REPLACEMENT N/A 11/21/2016   Procedure: REDO AORTIC VALVE REPLACEMENT (AVR);  Surgeon: Melrose Nakayama, MD;  Location: [facility];  Service: Open Heart Surgery;  Laterality: N/A;  . CARDIAC CATHETERIZATION    . CORONARY ARTERY BYPASS GRAFT N/A 11/21/2016   Procedure: CORONARY ARTERY BYPASS GRAFTING (CABG) x 2, using right leg greater saphenous vein harvested endoscopically;  Surgeon: Melrose Nakayama, MD;  Location: [facility];  Service: Open Heart Surgery;  Laterality: N/A;  . cyst removed  1976   right side of neck  . PACEMAKER IMPLANT N/A 11/26/2016   Procedure: Pacemaker Implant;  Surgeon: Deboraha Sprang, MD;  Location: [facility];  Service: Cardiovascular;  Laterality: N/A;  . TEE WITHOUT CARDIOVERSION N/A 11/21/2016   Procedure: TRANSESOPHAGEAL ECHOCARDIOGRAM (TEE);  Surgeon: Melrose Nakayama, MD;  Location: [facility];  Service: Open Heart Surgery;  Laterality: N/A;  . THORACIC AORTIC ANEURYSM REPAIR N/A 11/21/2016   Procedure: THORACIC ASCENDING ANEURYSM REPAIR (AAA);  Surgeon: Melrose Nakayama, MD;  Location: [facility];  Service: Open Heart Surgery;  Laterality: N/A;  . TONSILLECTOMY       Current Outpatient Medications  Medication Sig Dispense Refill  . allopurinol (ZYLOPRIM) 300 MG tablet Take 300 mg by mouth daily at 12 noon.     [name] Kitchen amoxicillin (AMOXIL) 500 MG capsule Take 2,000 mg by mouth See admin instructions. Take 4 capsules (2000 mg) by mouth 1 hour prior to dental procedures.    [name] Kitchen aspirin EC 81 MG EC tablet Take 1 tablet (81 mg total) by mouth daily.    [name] Kitchen atorvastatin (LIPITOR) 20 MG tablet Take 10 mg daily by mouth.    . carvedilol (COREG) 6.25 MG tablet TAKE 1 TABLET BY MOUTH TWO  TIMES DAILY  WITH A MEAL 180 tablet 0  . cholecalciferol (VITAMIN D) 1000 units tablet Take 5,000 Units by mouth. Wednesday AND SATURDAY    . Coenzyme Q10 50 MG CAPS Take 50 mg by mouth daily with breakfast.    . Cyanocobalamin (VITAMIN B-12) 5000 MCG SUBL Place 5,000 mcg under the tongue 2 (two) times a week. Wednesday & Saturday.    . ezetimibe (ZETIA) 10 MG tablet Take 5 mg by  mouth daily. In the morning.    . fish oil-omega-3 fatty acids 1000 MG capsule Take 1 g by mouth 2 (two) times daily.     [name] Kitchen ibuprofen (ADVIL,MOTRIN) 200 MG tablet Take 200 mg by mouth as needed.    . Magnesium 500 MG CAPS Take 500 mg by mouth every evening.     [name] Kitchen telmisartan (MICARDIS) 80 MG tablet Take 80 mg by mouth daily. In the morning.  11   No current facility-administered medications for this visit.     Allergies:   Patient has no known allergies.    Social History:  The patient  reports that he has never smoked. He has never used smokeless tobacco. He reports current alcohol use. He reports that he does not use drugs.   Family History:  The patient's family history includes Colon cancer in his mother; Diabetes in his maternal grandfather; Emphysema in his father; Hyperlipidemia in his mother; Other in his father.    ROS:  Please see the history of present illness.   Otherwise, review of systems are positive for none.   All other systems are reviewed and negative.    Physical Exam: Blood pressure (!) 146/60, pulse 66, height _0  (1.753 m), weight 173 lb (78.5 kg), SpO2 98 %.  GEN:  Well nourished, well developed in no acute distress HEENT: Normal NECK: No JVD; No carotid bruits LYMPHATICS: No lymphadenopathy CARDIAC: RRR , no murmurs, rubs, gallops RESPIRATORY:  Clear to auscultation without rales, wheezing or rhonchi  ABDOMEN: Soft, non-tender, non-distended MUSCULOSKELETAL:  No edema; No deformity  SKIN: Warm and dry NEUROLOGIC:  Alert and oriented x 3     EKG:    April 08, 2019: Normal sinus rhythm  at 66.  He has atrial sensing and ventricular pacing.  No acute changes.  Recent Labs: No results found for requested labs within last 8760 hours.    Lipid Panel No results found for: CHOL, TRIG, HDL, CHOLHDL, VLDL, LDLCALC, LDLDIRECT    Wt Readings from Last 3 Encounters:  04/08/19 173 lb (78.5 kg)  12/02/18 168 lb (76.2 kg)  03/14/18 172 lb 1.9 oz (78.1 kg)      Other studies Reviewed:    ASSESSMENT AND PLAN:  1.  Essential Hypertension:   -BP is well controled   2. Aortic valve replacement:   Stable.  Doing well   3. AV heart block:   - s/p pacer   4. Hyperlipidemia:   Labs managed by priamry md     Current medicines are reviewed at length with the patient today.  The patient does not have concerns regarding medicines.  Labs/ tests ordered today include:  No orders of the defined types were placed in this encounter.   Disposition:   FU with me  In 1 year     Mertie Moores, MD  04/08/2019 2:54 PM    [facility] [address], [place], [place]  [address] Phone: [phone]; Fax: [phone]

## 2019-04-08 NOTE — Patient Instructions (Signed)
Medication Instructions:  Your physician recommends that you continue on your current medications as directed. Please refer to the Current Medication list given to you today.  *If you need a refill on your cardiac medications before your next appointment, please call your pharmacy*   Lab Work: None Ordered    Testing/Procedures: None Ordered   Follow-Up: At [facility], you and your health needs are our priority.  As part of our continuing mission to provide you with exceptional heart care, we have created designated Provider Care Teams.  These Care Teams include your primary Cardiologist (physician) and Advanced Practice Providers (APPs -  Physician Assistants and Nurse Practitioners) who all work together to provide you with the care you need, when you need it.  Your next appointment:   1 year(s)  The format for your next appointment:   In Person  Provider:   You may see Mertie Moores, MD or one of the following Advanced Practice Providers on your designated Care Team:    Richardson Dopp, PA-C  [place], [place]  Daune Perch, [place]

## 2019-05-20 ENCOUNTER — Ambulatory Visit: Payer: Medicare Other | Attending: Internal Medicine

## 2019-05-20 ENCOUNTER — Other Ambulatory Visit: Payer: Self-pay

## 2019-05-20 DIAGNOSIS — Z23 Encounter for immunization: Secondary | ICD-10-CM

## 2019-05-20 NOTE — Progress Notes (Signed)
   Covid-19 Vaccination Clinic  Name:  Paulette Piersma    MRN: [id number] DOB: [birth year]  05/20/2019  Mr. Schwiesow was observed post Covid-19 immunization for 15 minutes without incidence. He was provided with Vaccine Information Sheet and instruction to access the V-Safe system.   Mr. Borroel was instructed to call 911 with any severe reactions post vaccine: [name] Kitchen Difficulty breathing  . Swelling of your face and throat  . A fast heartbeat  . A bad rash all over your body  . Dizziness and weakness    Immunizations Administered    Name Date Dose VIS Date Route   Pfizer COVID-19 Vaccine 05/20/2019 11:31 AM 0.3 mL 04/10/2019 Intramuscular   Manufacturer: [place]   Lot: [id number]   [place]: [id number]

## 2019-05-27 ENCOUNTER — Other Ambulatory Visit: Payer: Self-pay | Admitting: Cardiovascular Disease

## 2019-06-10 ENCOUNTER — Ambulatory Visit: Payer: Medicare Other | Attending: Internal Medicine

## 2019-06-10 DIAGNOSIS — Z23 Encounter for immunization: Secondary | ICD-10-CM

## 2019-06-10 NOTE — Progress Notes (Signed)
   Covid-19 Vaccination Clinic  Name:  Dakota Martin    MRN: [id number] DOB: [birth year]  06/10/2019  Mr. [name] was observed post Covid-19 immunization for 15 minutes without incidence. He was provided with Vaccine Information Sheet and instruction to access the V-Safe system.   Mr. [name] was instructed to call 911 with any severe reactions post vaccine: [name] Kitchen Difficulty breathing  . Swelling of your face and throat  . A fast heartbeat  . A bad rash all over your body  . Dizziness and weakness    Immunizations Administered    Name Date Dose VIS Date Route   Pfizer COVID-19 Vaccine 06/10/2019  4:09 PM 0.3 mL 04/10/2019 Intramuscular   Manufacturer: [place]   Lot: [id number]   [place]: [id number]

## 2019-06-11 ENCOUNTER — Ambulatory Visit (INDEPENDENT_AMBULATORY_CARE_PROVIDER_SITE_OTHER): Payer: Medicare Other | Admitting: *Deleted

## 2019-06-11 DIAGNOSIS — Z950 Presence of cardiac pacemaker: Secondary | ICD-10-CM

## 2019-06-11 LAB — CUP PACEART REMOTE DEVICE CHECK
Battery Remaining Longevity: 112 mo
Battery Voltage: 3 V
Brady Statistic AP VP Percent: 28.18 %
Brady Statistic AP VS Percent: 0 %
Brady Statistic AS VP Percent: 71.74 %
Brady Statistic AS VS Percent: 0.08 %
Brady Statistic RA Percent Paced: 28 %
Brady Statistic RV Percent Paced: 99.92 %
Date Time Interrogation Session: 20210210235829
Implantable Lead Implant Date: 20180730
Implantable Lead Implant Date: 20180730
Implantable Lead Location: 753859
Implantable Lead Location: 753860
Implantable Lead Model: 5076
Implantable Lead Model: 5076
Implantable Pulse Generator Implant Date: 20180730
Lead Channel Impedance Value: 304 Ohm
Lead Channel Impedance Value: 456 Ohm
Lead Channel Impedance Value: 513 Ohm
Lead Channel Impedance Value: 570 Ohm
Lead Channel Pacing Threshold Amplitude: 0.5 V
Lead Channel Pacing Threshold Amplitude: 0.625 V
Lead Channel Pacing Threshold Pulse Width: 0.4 ms
Lead Channel Pacing Threshold Pulse Width: 0.4 ms
Lead Channel Sensing Intrinsic Amplitude: 1.25 mV
Lead Channel Sensing Intrinsic Amplitude: 1.25 mV
Lead Channel Sensing Intrinsic Amplitude: 9.25 mV
Lead Channel Sensing Intrinsic Amplitude: 9.25 mV
Lead Channel Setting Pacing Amplitude: 1.5 V
Lead Channel Setting Pacing Amplitude: 2.5 V
Lead Channel Setting Pacing Pulse Width: 0.4 ms
Lead Channel Setting Sensing Sensitivity: 4 mV

## 2019-06-11 NOTE — Progress Notes (Signed)
PPM Remote  

## 2019-09-07 ENCOUNTER — Encounter: Payer: Self-pay | Admitting: Gastroenterology

## 2019-09-10 ENCOUNTER — Ambulatory Visit (INDEPENDENT_AMBULATORY_CARE_PROVIDER_SITE_OTHER): Payer: Medicare Other | Admitting: *Deleted

## 2019-09-10 DIAGNOSIS — I442 Atrioventricular block, complete: Secondary | ICD-10-CM | POA: Diagnosis not present

## 2019-09-10 LAB — CUP PACEART REMOTE DEVICE CHECK
Battery Remaining Longevity: 108 mo
Battery Voltage: 2.99 V
Brady Statistic AP VP Percent: 31.92 %
Brady Statistic AP VS Percent: 0 %
Brady Statistic AS VP Percent: 67.99 %
Brady Statistic AS VS Percent: 0.09 %
Brady Statistic RA Percent Paced: 31.7 %
Brady Statistic RV Percent Paced: 99.91 %
Date Time Interrogation Session: 20210513005749
Implantable Lead Implant Date: 20180730
Implantable Lead Implant Date: 20180730
Implantable Lead Location: 753859
Implantable Lead Location: 753860
Implantable Lead Model: 5076
Implantable Lead Model: 5076
Implantable Pulse Generator Implant Date: 20180730
Lead Channel Impedance Value: 323 Ohm
Lead Channel Impedance Value: 456 Ohm
Lead Channel Impedance Value: 513 Ohm
Lead Channel Impedance Value: 551 Ohm
Lead Channel Pacing Threshold Amplitude: 0.5 V
Lead Channel Pacing Threshold Amplitude: 0.5 V
Lead Channel Pacing Threshold Pulse Width: 0.4 ms
Lead Channel Pacing Threshold Pulse Width: 0.4 ms
Lead Channel Sensing Intrinsic Amplitude: 1.25 mV
Lead Channel Sensing Intrinsic Amplitude: 1.25 mV
Lead Channel Sensing Intrinsic Amplitude: 9.25 mV
Lead Channel Sensing Intrinsic Amplitude: 9.25 mV
Lead Channel Setting Pacing Amplitude: 1.5 V
Lead Channel Setting Pacing Amplitude: 2.5 V
Lead Channel Setting Pacing Pulse Width: 0.4 ms
Lead Channel Setting Sensing Sensitivity: 4 mV

## 2019-09-14 NOTE — Progress Notes (Signed)
Remote pacemaker transmission.   

## 2019-10-14 ENCOUNTER — Encounter: Payer: Self-pay | Admitting: Gastroenterology

## 2019-10-14 ENCOUNTER — Ambulatory Visit: Payer: Medicare Other | Admitting: Gastroenterology

## 2019-10-14 VITALS — BP 148/66 | HR 86 | Ht 68.5 in | Wt 174.0 lb

## 2019-10-14 DIAGNOSIS — Z01818 Encounter for other preprocedural examination: Secondary | ICD-10-CM | POA: Diagnosis not present

## 2019-10-14 DIAGNOSIS — Z86010 Personal history of colonic polyps: Secondary | ICD-10-CM | POA: Diagnosis not present

## 2019-10-14 MED ORDER — SUTAB 1479-225-188 MG PO TABS: 1.0000 | ORAL_TABLET | Freq: Once | ORAL | 0 refills | Status: AC

## 2019-10-14 NOTE — Progress Notes (Signed)
HPI :  79 y/o male with a history of aortic valve replacement with re do AVR in 2018, history of colon polyps, HTN, HLD, here for a follow-up visit to establish GI care and discuss surveillance colonoscopy.  His last colonoscopy was in March 2016 with Dr. Olevia Perches.  At that point time he had a 10 mm adenoma removed from the ascending colon.  No other concerning pathology noted.  He denies any problems with his bowels other than occasional constipation.  No blood in his stools.  He states his mother had a colon resection much later in life, he thinks she may have had colon cancer but not entirely sure.  He denies any weight loss or other alarm symptoms.  Since his valve surgery in 2018 he has been able to come off anticoagulation, he was previously on Coumadin.  He only takes baby aspirin.  No chest pain or shortness of breath, no cardiopulmonary symptoms.  Last echo showed EF of 55 to 60% in 2018.  He plays tennis and is quite active without any limiting symptoms.    Colonoscopy 07/13/14 - A semi-pedunculated polyp measuring 10 mm in size was found in the ascending colon. A polypectomy was performed using snare cautery. The resection was complete, the polyp tissue was completely retrieved and sent to histology. Retroflexed views revealed no abnormalities. The time to cecum = 5.46 Withdrawal time = 9.32 The scope was withdrawn and the procedure completed.  Diagnosis Surgical [P], ascending, polyp - TUBULAR ADENOMA. NO HIGH GRADE DYSPLASIA OR MALIGNANCY IDENTIFIED.   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, has slight loss of vision   S/P aortic valve replacement with metallic valve 82/80/0349   Due to congenital aortic stenosis // [name] mechanical valve done in 1982 // Echo 09/10/16: Mild focal basal septal hypertrophy, [name], [name], [name], mod aortic root enlargement, aortic root 36 mm, ascending aorta 49 mm, [name], [name]     Past Surgical History:  Procedure Laterality Date   Aortic valve  1982   prosthetic  val ve   AORTIC VALVE REPLACEMENT N/A 11/21/2016   Procedure: REDO AORTIC VALVE REPLACEMENT (AVR);  Surgeon: Melrose Nakayama, MD;  Location: [facility];  Service: Open Heart Surgery;  Laterality: N/A;   CARDIAC CATHETERIZATION     CORONARY ARTERY BYPASS GRAFT N/A 11/21/2016   Procedure: CORONARY ARTERY BYPASS GRAFTING (CABG) x 2, using right leg greater saphenous vein harvested endoscopically;  Surgeon: Melrose Nakayama, MD;  Location: [facility];  Service: Open Heart Surgery;  Laterality: N/A;   cyst removed  1976   right side of neck   PACEMAKER IMPLANT N/A 11/26/2016   Procedure: Pacemaker Implant;  Surgeon: Deboraha Sprang, MD;  Location: [facility];  Service: Cardiovascular;  Laterality: N/A;   TEE WITHOUT CARDIOVERSION N/A 11/21/2016   Procedure: TRANSESOPHAGEAL ECHOCARDIOGRAM (TEE);  Surgeon: Melrose Nakayama, MD;  Location: [facility];  Service: Open Heart Surgery;  Laterality: N/A;   THORACIC AORTIC ANEURYSM REPAIR N/A 11/21/2016   Procedure: THORACIC ASCENDING ANEURYSM REPAIR (AAA);  Surgeon: Melrose Nakayama, MD;  Location: [facility];  Service: Open Heart Surgery;  Laterality: N/A;   TONSILLECTOMY     Family History  Problem Relation Age of Onset   Hyperlipidemia Mother    Colon cancer Mother    Emphysema Father    Other Father        respiratory failure   Diabetes Maternal Grandfather    Colon polyps Neg Hx    Kidney disease Neg Hx     Esophageal cancer Neg Hx    Gallbladder disease Neg Hx    Social History   Tobacco Use   Smoking status: Never Smoker   Smokeless tobacco: Never Used  Vaping Use   Vaping Use: Never used  Substance Use Topics   Alcohol use: Yes    Alcohol/week: 0.0 standard drinks    Comment: 2 drinks a day   Drug use: No   Current Outpatient Medications  Medication Sig Dispense Refill   allopurinol (ZYLOPRIM) 300 MG tablet Take 300 mg by mouth daily at 12 noon.      aspirin EC 81 MG EC tablet Take 1 tablet (81 mg total) by mouth daily.     atorvastatin (LIPITOR) 20 MG tablet Take 10 mg daily by mouth.     carvedilol (COREG) 6.25 MG tablet TAKE 1 TABLET BY MOUTH  TWICE DAILY WITH A MEAL 180 tablet 3   cholecalciferol (VITAMIN D) 1000 units tablet Take 5,000 Units by mouth. Wednesday AND SATURDAY     Coenzyme Q10 50 MG CAPS Take 50 mg by mouth daily with breakfast.     Cyanocobalamin (VITAMIN B-12) 5000 MCG SUBL Place 5,000 mcg under the tongue 2 (two) times a week. Wednesday & Saturday.     ezetimibe (ZETIA) 10 MG tablet Take 5 mg by mouth daily. In the morning.     fish oil-omega-3 fatty acids 1000 MG capsule Take 1 g by mouth 2 (two) times daily.      ibuprofen (ADVIL,MOTRIN) 200 MG tablet Take 200 mg by mouth as needed.     Magnesium 500 MG CAPS Take 500 mg by mouth every evening.      telmisartan (MICARDIS) 80 MG tablet Take 80 mg by mouth daily. In the morning.  11   No current facility-administered medications for this visit.   No Known Allergies   Review of Systems: All systems reviewed and negative except where noted in HPI.    Physical Exam: BP (!) 148/66    Pulse 86    Ht 5' 8.5" (1.74 m)    Wt 174 lb (78.9 kg)    BMI 26.07 kg/m  Constitutional: Pleasant,well-developed, male in no acute distress. HEENT: Normocephalic and atraumatic. Conjunctivae are normal. No scleral icterus. Neck supple.  Cardiovascular: Normal rate, regular rhythm.  Pulmonary/chest: Effort  normal and breath sounds normal. No wheezing, rales or rhonchi. Abdominal: Soft, nondistended, nontender.  Extremities: no edema Lymphadenopathy: No cervical adenopathy noted. Neurological: Alert and oriented to person place and time. Skin: Skin is warm and dry. No rashes noted. Psychiatric: Normal mood and affect. Behavior is normal.   ASSESSMENT AND PLAN: 79 year old male here for new patient assessment the following:  History of colon polyps - he had an advanced adenoma removed in 2016.  At his age we discussed if he wanted any further surveillance colonoscopies.  We discussed risks  and benefits of colonoscopy and anesthesia.  Given his advanced adenoma I think 1 more surveillance colonoscopy at this time is reasonable and if no high risk lesions would then forego further surveillance.  He is no longer on anticoagulation after his surgery in 2018 and he has no cardiopulmonary symptoms and is in very good shape.  He was in agreement, he wanted to proceed with colonoscopy.  Further recommendations pending results  [place] Cellar, MD [facility]  CC: Crist Infante, MD

## 2019-10-14 NOTE — Patient Instructions (Signed)
If you are age 78 or older, your body mass index should be between 23-30. Your Body mass index is 26.07 kg/m. If this is out of the aforementioned range listed, please consider follow up with your Primary Care Provider.  If you are age 31 or younger, your body mass index should be between 19-25. Your Body mass index is 26.07 kg/m. If this is out of the aformentioned range listed, please consider follow up with your Primary Care Provider.   You have been scheduled for a colonoscopy. Please follow written instructions given to you at your visit today.  Please pick up your prep supplies at the pharmacy within the next 1-3 days. If you use inhalers (even only as needed), please bring them with you on the day of your procedure.   Thank you for entrusting me with your care and for choosing [facility], Dr. T[place]ico Cellar

## 2019-10-15 ENCOUNTER — Encounter: Payer: Self-pay | Admitting: Gastroenterology

## 2019-11-12 ENCOUNTER — Encounter: Payer: Medicare Other | Admitting: Gastroenterology

## 2019-11-13 ENCOUNTER — Telehealth: Payer: Self-pay | Admitting: Gastroenterology

## 2019-11-13 NOTE — Telephone Encounter (Signed)
[place] instructions for Friday, 12-04-19 sent to patient via  MyChart and printed and put at the front desk for pt to pick up.

## 2019-11-19 ENCOUNTER — Other Ambulatory Visit: Payer: Self-pay | Admitting: Thoracic Surgery (Cardiothoracic Vascular Surgery)

## 2019-11-19 DIAGNOSIS — I7121 Aneurysm of the ascending aorta, without rupture: Secondary | ICD-10-CM

## 2019-11-19 DIAGNOSIS — I712 Thoracic aortic aneurysm, without rupture, unspecified: Secondary | ICD-10-CM

## 2019-11-19 DIAGNOSIS — Z951 Presence of aortocoronary bypass graft: Secondary | ICD-10-CM

## 2019-11-24 ENCOUNTER — Encounter: Payer: Self-pay | Admitting: Gastroenterology

## 2019-12-03 ENCOUNTER — Other Ambulatory Visit: Payer: Medicare Other

## 2019-12-04 ENCOUNTER — Ambulatory Visit (AMBULATORY_SURGERY_CENTER): Payer: Medicare Other | Admitting: Gastroenterology

## 2019-12-04 ENCOUNTER — Other Ambulatory Visit: Payer: Self-pay

## 2019-12-04 ENCOUNTER — Encounter: Payer: Self-pay | Admitting: Gastroenterology

## 2019-12-04 VITALS — BP 136/61 | HR 64 | Temp 95.9°F | Resp 17 | Ht 68.0 in | Wt 174.0 lb

## 2019-12-04 DIAGNOSIS — Z86010 Personal history of colonic polyps: Secondary | ICD-10-CM

## 2019-12-04 DIAGNOSIS — D120 Benign neoplasm of cecum: Secondary | ICD-10-CM | POA: Diagnosis not present

## 2019-12-04 DIAGNOSIS — D122 Benign neoplasm of ascending colon: Secondary | ICD-10-CM | POA: Diagnosis not present

## 2019-12-04 DIAGNOSIS — D123 Benign neoplasm of transverse colon: Secondary | ICD-10-CM

## 2019-12-04 DIAGNOSIS — D125 Benign neoplasm of sigmoid colon: Secondary | ICD-10-CM

## 2019-12-04 HISTORY — PX: COLONOSCOPY: SHX174

## 2019-12-04 MED ORDER — SODIUM CHLORIDE 0.9 % IV SOLN
500.0000 mL | Freq: Once | INTRAVENOUS | Status: DC
Start: 2019-12-04 — End: 2019-12-04

## 2019-12-04 NOTE — Progress Notes (Signed)
PT taken to PACU. Monitors in place. VSS. Report given to RN. 

## 2019-12-04 NOTE — Patient Instructions (Signed)
YOU HAD AN ENDOSCOPIC PROCEDURE TODAY AT THE [facility]:   Refer to the procedure report that was given to you for any specific questions about what was found during the examination.  If the procedure report does not answer your questions, please call your gastroenterologist to clarify.  If you requested that your care partner not be given the details of your procedure findings, then the procedure report has been included in a sealed envelope for you to review at your convenience later.  YOU SHOULD EXPECT: Some feelings of bloating in the abdomen. Passage of more gas than usual.  Walking can help get rid of the air that was put into your GI tract during the procedure and reduce the bloating. If you had a lower endoscopy (such as a colonoscopy or flexible sigmoidoscopy) you may notice spotting of blood in your stool or on the toilet paper. If you underwent a bowel prep for your procedure, you may not have a normal bowel movement for a few days.  Please Note:  You might notice some irritation and congestion in your nose or some drainage.  This is from the oxygen used during your procedure.  There is no need for concern and it should clear up in a day or so.  SYMPTOMS TO REPORT IMMEDIATELY:   Following lower endoscopy (colonoscopy or flexible sigmoidoscopy):  Excessive amounts of blood in the stool  Significant tenderness or worsening of abdominal pains  Swelling of the abdomen that is new, acute  Fever of 100F or higher  For urgent or emergent issues, a gastroenterologist can be reached at any hour by calling [phone]. Do not use MyChart messaging for urgent concerns.    DIET:  We do recommend a small meal at first, but then you may proceed to your regular diet.  Drink plenty of fluids but you should avoid alcoholic beverages for 24 hours.  ACTIVITY:  You should plan to take it easy for the rest of today and you should NOT DRIVE or use heavy machinery until tomorrow (because  of the sedation medicines used during the test).    FOLLOW UP: Our staff will call the number listed on your records 48-72 hours following your procedure to check on you and address any questions or concerns that you may have regarding the information given to you following your procedure. If we do not reach you, we will leave a message.  We will attempt to reach you two times.  During this call, we will ask if you have developed any symptoms of COVID 19. If you develop any symptoms (ie: fever, flu-like symptoms, shortness of breath, cough etc.) before then, please call [phone].  If you test positive for Covid 19 in the 2 weeks post procedure, please call and report this information to us.    If any biopsies were taken you will be contacted by phone or by letter within the next 1-3 weeks.  Please call us at [phone] if you have not heard about the biopsies in 3 weeks.    SIGNATURES/CONFIDENTIALITY: You and/or your care partner have signed paperwork which will be entered into your electronic medical record.  These signatures attest to the fact that that the information above on your After Visit Summary has been reviewed and is understood.  Full responsibility of the confidentiality of this discharge information lies with you and/or your care-partner. 

## 2019-12-04 NOTE — Progress Notes (Signed)
Called to room to assist during endoscopic procedure.  Patient ID and intended procedure confirmed with present staff. Received instructions for my participation in the procedure from the performing physician.  

## 2019-12-04 NOTE — Op Note (Signed)
[facility] Patient Name: Dakota Martin Procedure Date: 12/04/2019 1:44 PM MRN: [id number] Endoscopist: Remo Lipps P. Havery Moros , MD Age: 79 Referring MD:  Date of Birth: 1941-02-10 Gender: Male Account #: [id number] Procedure:                Colonoscopy Indications:              Surveillance: Personal history of adenomatous                            polyps on last colonoscopy 5 years ago (advanced                            adenoma) Medicines:                Monitored Anesthesia Care Procedure:                Pre-Anesthesia Assessment:                           - Prior to the procedure, a History and Physical                            was performed, and patient medications and                            allergies were reviewed. The patient's tolerance of                            previous anesthesia was also reviewed. The risks                            and benefits of the procedure and the sedation                            options and risks were discussed with the patient.                            All questions were answered, and informed consent                            was obtained. Prior Anticoagulants: The patient has                            taken no previous anticoagulant or antiplatelet                            agents. ASA Grade Assessment: III - A patient with                            severe systemic disease. After reviewing the risks                            and benefits, the patient was deemed in  satisfactory condition to undergo the procedure.                           After obtaining informed consent, the colonoscope                            was passed under direct vision. Throughout the                            procedure, the patient's blood pressure, pulse, and                            oxygen saturations were monitored continuously. The                            Colonoscope was introduced through the anus and                             advanced to the the cecum, identified by                            appendiceal orifice and ileocecal valve. The                            colonoscopy was performed without difficulty. The                            patient tolerated the procedure well. The quality                            of the bowel preparation was good. The ileocecal                            valve, appendiceal orifice, and rectum were                            photographed. Scope In: 1:51:09 PM Scope Out: 2:14:43 PM Scope Withdrawal Time: 0 hours 18 minutes 52 seconds  Total Procedure Duration: 0 hours 23 minutes 34 seconds  Findings:                 The perianal and digital rectal examinations were                            normal.                           A 3 to 4 mm polyp was found in the cecum. The polyp                            was sessile. The polyp was removed with a cold                            snare. Resection and retrieval were complete.  Two sessile polyps were found in the ascending                            colon. The polyps were 3 mm in size. These polyps                            were removed with a cold snare. Resection and                            retrieval were complete.                           A 3 mm polyp was found in the transverse colon. The                            polyp was sessile. The polyp was removed with a                            cold snare. Resection and retrieval were complete.                           Two sessile polyps were found in the sigmoid colon.                            The polyps were 4 mm in size. These polyps were                            removed with a cold snare. Resection and retrieval                            were complete.                           Internal hemorrhoids were found during retroflexion.                           The exam was otherwise without abnormality. Complications:             No immediate complications. Estimated blood loss:                            Minimal. Estimated Blood Loss:     Estimated blood loss was minimal. Impression:               - One 3 to 4 mm polyp in the cecum, removed with a                            cold snare. Resected and retrieved.                           - Two 3 mm polyps in the ascending colon, removed                            with  a cold snare. Resected and retrieved.                           - One 3 mm polyp in the transverse colon, removed                            with a cold snare. Resected and retrieved.                           - Two 4 mm polyps in the sigmoid colon, removed                            with a cold snare. Resected and retrieved.                           - Internal hemorrhoids.                           - The examination was otherwise normal. Recommendation:           - Patient has a contact number available for                            emergencies. The signs and symptoms of potential                            delayed complications were discussed with the                            patient. Return to normal activities tomorrow.                            Written discharge instructions were provided to the                            patient.                           - Resume previous diet.                           - Continue present medications.                           - Await pathology results.                           - Likely no further surveillance colonoscopies                            needed due to age (patient would not be due next                            until age 21, at which point in time risks would  likely outweigh benefits) Remo Lipps P. Havery Moros, MD 12/04/2019 2:20:48 PM This report has been signed electronically.

## 2019-12-08 ENCOUNTER — Ambulatory Visit: Payer: Medicare Other | Admitting: Thoracic Surgery (Cardiothoracic Vascular Surgery)

## 2019-12-08 ENCOUNTER — Telehealth: Payer: Self-pay

## 2019-12-08 NOTE — Telephone Encounter (Signed)
  Follow up Call-  Call back number 12/04/2019  Post procedure Call Back phone  # [phone]  Permission to leave phone message Yes  Some recent data might be hidden     Patient questions:  Do you have a fever, pain , or abdominal swelling? No. Pain Score  0 *  Have you tolerated food without any problems? Yes.    Have you been able to return to your normal activities? Yes.    Do you have any questions about your discharge instructions: Diet   No. Medications  No. Follow up visit  No.  Do you have questions or concerns about your Care? No.  Actions: * If pain score is 4 or above: No action needed, pain <4.  Have you developed a fever since your procedure? No 2.   Have you had an respiratory symptoms (SOB or cough) since your procedure? No 3.   Have you tested positive for COVID 19 since your procedure No  4.   Have you had any family members/close contacts diagnosed with the COVID 19 since your procedure? No   If yes to any of these questions please route to Joylene John, RN and Erenest Rasher, RN

## 2019-12-10 ENCOUNTER — Ambulatory Visit (INDEPENDENT_AMBULATORY_CARE_PROVIDER_SITE_OTHER): Payer: Medicare Other | Admitting: *Deleted

## 2019-12-10 DIAGNOSIS — I442 Atrioventricular block, complete: Secondary | ICD-10-CM | POA: Diagnosis not present

## 2019-12-10 LAB — CUP PACEART REMOTE DEVICE CHECK
Battery Remaining Longevity: 105 mo
Battery Voltage: 2.99 V
Brady Statistic AP VP Percent: 34.61 %
Brady Statistic AP VS Percent: 0 %
Brady Statistic AS VP Percent: 65.32 %
Brady Statistic AS VS Percent: 0.07 %
Brady Statistic RA Percent Paced: 34.31 %
Brady Statistic RV Percent Paced: 99.93 %
Date Time Interrogation Session: 20210812005601
Implantable Lead Implant Date: 20180730
Implantable Lead Implant Date: 20180730
Implantable Lead Location: 753859
Implantable Lead Location: 753860
Implantable Lead Model: 5076
Implantable Lead Model: 5076
Implantable Pulse Generator Implant Date: 20180730
Lead Channel Impedance Value: 304 Ohm
Lead Channel Impedance Value: 437 Ohm
Lead Channel Impedance Value: 494 Ohm
Lead Channel Impedance Value: 551 Ohm
Lead Channel Pacing Threshold Amplitude: 0.375 V
Lead Channel Pacing Threshold Amplitude: 0.625 V
Lead Channel Pacing Threshold Pulse Width: 0.4 ms
Lead Channel Pacing Threshold Pulse Width: 0.4 ms
Lead Channel Sensing Intrinsic Amplitude: 1.125 mV
Lead Channel Sensing Intrinsic Amplitude: 1.125 mV
Lead Channel Sensing Intrinsic Amplitude: 11.125 mV
Lead Channel Sensing Intrinsic Amplitude: 11.125 mV
Lead Channel Setting Pacing Amplitude: 1.5 V
Lead Channel Setting Pacing Amplitude: 2.5 V
Lead Channel Setting Pacing Pulse Width: 0.4 ms
Lead Channel Setting Sensing Sensitivity: 4 mV

## 2019-12-14 ENCOUNTER — Ambulatory Visit
Admission: RE | Admit: 2019-12-14 | Discharge: 2019-12-14 | Disposition: A | Discharge status: Home / Self Care | Payer: Medicare Other | Source: Ambulatory Visit | Attending: Thoracic Surgery (Cardiothoracic Vascular Surgery) | Admitting: Thoracic Surgery (Cardiothoracic Vascular Surgery)

## 2019-12-14 DIAGNOSIS — I712 Thoracic aortic aneurysm, without rupture, unspecified: Secondary | ICD-10-CM

## 2019-12-14 MED ORDER — IOPAMIDOL (ISOVUE-370) INJECTION 76%
75.0000 mL | Freq: Once | INTRAVENOUS | Status: AC | PRN
  Administered 2019-12-14: 75 mL via INTRAVENOUS

## 2019-12-14 NOTE — Progress Notes (Signed)
Remote pacemaker transmission.   

## 2019-12-15 ENCOUNTER — Ambulatory Visit: Payer: Medicare Other | Admitting: Thoracic Surgery (Cardiothoracic Vascular Surgery)

## 2019-12-22 ENCOUNTER — Ambulatory Visit: Payer: Medicare Other | Admitting: Thoracic Surgery (Cardiothoracic Vascular Surgery)

## 2019-12-29 ENCOUNTER — Other Ambulatory Visit: Payer: Self-pay

## 2019-12-29 ENCOUNTER — Ambulatory Visit: Payer: Medicare Other | Admitting: Thoracic Surgery (Cardiothoracic Vascular Surgery)

## 2019-12-29 ENCOUNTER — Encounter: Payer: Self-pay | Admitting: Thoracic Surgery (Cardiothoracic Vascular Surgery)

## 2019-12-29 VITALS — BP 180/78 | HR 79 | Temp 97.7°F | Resp 20 | Ht 68.0 in | Wt 174.0 lb

## 2019-12-29 DIAGNOSIS — I712 Thoracic aortic aneurysm, without rupture, unspecified: Secondary | ICD-10-CM

## 2019-12-29 NOTE — Progress Notes (Signed)
[address]       [place] [address]             [phone]     HPI: [name] returns for scheduled follow-up visit  Dakota Martin is a 79 year old man with a history of the AVR for aortic stenosis, ascending aneurysm, hypertension, aortic atherosclerosis, and hyperlipidemia.  He had a Bjork Shiley valve placed in 1982.  He developed an enlarging ascending aneurysm.  I did a redo AVR with repair of the ascending aneurysm and CABG x2 in July 2018.  Last saw him in August 2020.  He was doing well at that time.  We are monitoring the distal ascending aorta above where the aneurysm was previously as it is slightly larger than the graft.  He has been feeling well.  He denies any chest pain, pressure, or tightness.  He is not having shortness of breath with exertion.  He monitors his blood pressure at home and says is been within the normal range.  He has had his Covid vaccination.  Past Medical History:  Diagnosis Date  . Aortic valve disease   . Arthritis   . Ascending aortic aneurysm ([place]) 02/14/2016   5 cm in 2015 // Chest CT 08/28/16:  Ascending thoracic aortic aneurysm 5.5 cm, Thoracic and abdominal aortic atherosclerosis, Remote granulomatous disease  . Colon polyp   . Gout   . Heart murmur    as a younger person,   . Hyperlipidemia   . Hypertension   . 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 S/P aortic valve replacement with metallic valve 88/41/6606   Due to congenital aortic stenosis // Dakota Martin mechanical valve done in 1982 // Echo 09/10/16: Mild focal basal septal hypertrophy, [name], [name], [name], mod aortic root enlargement, aortic root 36 mm, ascending aorta 49 mm, [name], [name]    Current Outpatient Medications  Medication Sig Dispense Refill  . allopurinol (ZYLOPRIM) 300 MG tablet Take 300 mg by mouth daily at 12 noon.     [name] Kitchen  aspirin EC 81 MG EC tablet Take 1 tablet (81 mg total) by mouth daily.    [name] Kitchen atorvastatin (LIPITOR) 20 MG tablet Take 10 mg daily by mouth.    . carvedilol (COREG) 6.25 MG tablet TAKE 1 TABLET BY MOUTH  TWICE DAILY WITH A MEAL 180 tablet 3  . cholecalciferol (VITAMIN D) 1000 units tablet Take 5,000 Units by mouth. Wednesday AND SATURDAY    . Coenzyme Q10 50 MG CAPS Take 50 mg by mouth daily with breakfast.    . Cyanocobalamin (VITAMIN B-12) 5000 MCG SUBL Place 5,000 mcg under the tongue 2 (two) times a week. Wednesday & Saturday.    . ezetimibe (ZETIA) 10 MG tablet Take 5 mg by mouth daily. In the morning.    . fish oil-omega-3 fatty acids 1000 MG capsule Take 1 g by mouth 2 (two) times daily.     [name] Kitchen ibuprofen (ADVIL,MOTRIN) 200 MG tablet Take 200 mg by mouth as needed.    . Magnesium 500 MG CAPS Take 500 mg by mouth every evening.     [name] Kitchen telmisartan (MICARDIS) 80 MG tablet Take 80 mg by mouth daily. In the morning.  11   No current facility-administered medications for this visit.    Physical Exam BP (!) 180/78   Pulse 79  Temp 97.7 F (36.5 C) (Skin)   Resp 20   Ht _0  (1.727 m)   Wt 174 lb (78.9 kg)   SpO2 98% Comment: RA  BMI 26.58 kg/m  79 year old man in no acute distress Alert and oriented x3 with no focal deficits No carotid bruits Cardiac regular rate and rhythm faint systolic murmur, no diastolic murmur Lungs clear with equal breath sounds bilaterally No peripheral edema  Diagnostic Tests: CT ANGIOGRAPHY CHEST WITH CONTRAST  TECHNIQUE: Multidetector CT imaging of the chest was performed using the standard protocol during bolus administration of intravenous contrast. Multiplanar CT image reconstructions and MIPs were obtained to evaluate the vascular anatomy.  CONTRAST:  27m ISOVUE-370 IOPAMIDOL (ISOVUE-370) INJECTION 76%  COMPARISON:  Images from 11/20/2017 are not available at the time of dictation. Report is reviewed. Comparison is made to  08/28/2016.  FINDINGS: Cardiovascular: Atherosclerotic calcification of the aorta. Ascending aortic aneurysm repair and aortic valve replacement. Ascending aorta measures up to 4.0 cm distally, decreased from 5.1 cm on 08/28/2016. Heart size normal. Left ventricle appears dilated. No pericardial effusion.  Mediastinum/Nodes: No pathologically enlarged mediastinal, hilar or axillary lymph nodes. There are calcified mediastinal and hilar lymph nodes. Esophagus is grossly unremarkable.  Lungs/Pleura: Calcified granulomas. Minimal dependent volume loss in the lower lobes. Lungs are otherwise clear. No pleural fluid. Airway is unremarkable.  Upper Abdomen: Low-attenuation lesions in the liver measure up to 2.3 cm similar and likely cysts. Visualized portions of the liver, adrenal glands, kidneys, spleen, pancreas, stomach and bowel are otherwise unremarkable. No upper abdominal adenopathy.  Musculoskeletal: Degenerative changes in the spine and shoulders. No worrisome lytic or sclerotic lesions.  Review of the MIP images confirms the above findings.  IMPRESSION: 1. Ascending Aortic aneurysm NOS (ICD10-I71.9). Recommend annual imaging followup by CTA or MRA. This recommendation follows 2010 ACCF/AHA/AATS/ACR/ASA/SCA/SCAI/SIR/STS/SVM Guidelines for the Diagnosis and Management of Patients with Thoracic Aortic Disease. Circulation. 2010; 121:: [id number] Aortic aneurysm NOS (ICD10-I71.9). 2.  Aortic atherosclerosis (ICD10-I70.0).   Electronically Signed   By: MLorin PicketM.D.   On: 12/14/2019 09:08 I personally reviewed the CT images and concur with the findings noted above  Impression: Dakota Martin a 79year old man with a history of the AVR for aortic stenosis, ascending aneurysm, hypertension, aortic atherosclerosis, and hyperlipidemia.   He had a redo aortic valve replacement, repair of an ascending aneurysm, and CABG x2 in 2018.  We are now  monitoring his distal ascending aorta which is borderline aneurysmal.  Ascending aneurysm-stable we will plan to rescan in a year.  Hypertension-blood pressure is elevated systolic of 1544  He says that he checks himself regularly and his blood pressures are normal at home.  I emphasized the importance of blood pressure control in terms of his ascending aorta.  I recommended he continue to check himself regularly and be sure to check both arms to make sure there is not a pressure gradient.  CAD-status post CABG x2.  No anginal symptoms.  Plan: Return in 1 year with CT chest.  We will plan to do without contrast.  We can always repeat with contrast if there is significant size change.  SMelrose Nakayama MD [facility] ([phone]

## 2020-02-24 ENCOUNTER — Encounter: Payer: Self-pay | Admitting: Family Medicine

## 2020-02-24 ENCOUNTER — Other Ambulatory Visit: Payer: Self-pay

## 2020-02-24 ENCOUNTER — Ambulatory Visit (INDEPENDENT_AMBULATORY_CARE_PROVIDER_SITE_OTHER): Payer: Medicare Other | Admitting: Family Medicine

## 2020-02-24 ENCOUNTER — Ambulatory Visit: Payer: Self-pay

## 2020-02-24 DIAGNOSIS — M7542 Impingement syndrome of left shoulder: Secondary | ICD-10-CM

## 2020-02-24 NOTE — Progress Notes (Signed)
Office Visit Note   Patient: Dakota Martin           Date of Birth: [birth year]           MRN: [id number] Visit Date: 02/24/2020 Requested by: Crist Infante, MD [address] [place],  [place] [address] PCP: Crist Infante, MD  Subjective: Chief Complaint  Patient presents with  . Left Shoulder - Pain    Anterior shoulder pain - woke up with it last Friday. The day before he played tennis in the morning and then worked in the yard that afternoon. Can sleep with a heating pad on it. Tylenol prn. Hurts to raise the arm up.    HPI: 79yo M presenting to clinic with concern of Left shoulder pain since last Friday (6 days ago). Patient states that he was playing Tennis, and serving left handed, then had to do some yard work with a Clinical research associate. He felt fine when he went to bed that night, but woke up with a deep aching shoulder pain the following morning. He denies any specific moment of pain during the day, and can't recall any trauma. He says that he's taken very few Ibuprofen, as well as an occasional tylenol, which seem to help somewhat. He has a previous rotator cuff injury in his right shoulder, and had been doing PT exercises for both his shoulders as prevention, but hasn't started doing those for this as he wanted to be evaluated first. He has tried putting a heating pack on his shoulder, which helped somewhat. He is scared he might have injured the rotator cuff on this side, as his pain is significantly worsened with reaching overhead or externally rotating his arm.               ROS:   All other systems were reviewed and are negative.  Objective: Vital Signs: There were no vitals taken for this visit.  Physical Exam:  General:  Alert and oriented, in no acute distress. Pulm:  Breathing unlabored. Psy:  Normal mood, congruent affect. Skin:  Left shoulder with no overlying bruises, no rashes, no erythema. Skin intact.   Left Shoulder Exam:  Inspection: Symmetric muscle  mass, no atrophy or deformity, no scars. Palpation: No tenderness to palpation over and around the acromion, over the [facility] joint or biceps insertion.  Range of motion: Reduced ROM in Abduction and Flexion to approximately 90*. Endorses pain with internal rotation in Abduction, which is restricted to approximately 45*. External ROM intact.   Rotator cuff testing:  Strength preserved with empty can, though significant crepitus appreciated (supraspinatus).  External rotation with full strength, though does endorse pain. Strength preserved with internal rotation, without significant pain on Napolean testing.   AC joint testing: No AC tenderness to palpation, negative scarf test. Negative active compression test.  Biceps testing: Negative speeds, negative Yergason.  Labral testing: O'Brien's/speeds with full strength and no pain.  Impingement testing: Endorses pain with Hawkins  Strength testing:  5 out of 5 strength with shoulder abduction (C5), wrist extension (C6), wrist flexion (C7), grip strength (C8), and finger abduction (T1).  Sensation: Intact to light touch throughout bilateral upper extremities.   Brisk distal capillary refill.   Imaging: Left shoulder Ultrasound:  Biceps tendon visualized in long and short access, with no surrounding fluid. Fibers intact.  Subscapularis, infraspinatus, and teres minor fibers intact.  Supraspinatus with no obvious tears, though does have some hyperechoic signal throughout the substance of the muscle. Subacromial bursa enlarged.  AC Joint with mild effusion.   Impression: No obvious Rotator cuff tears. Supraspinatus Tendinopathy.  Assessment & Plan: 79yo M presenting to clinic with L shoulder pain after vigorous day of Tennis and yard work. No acute trauma, which is reassuring. Examination as above, which was significant for marked crepitus and decreased ROM with Abduction as well as internal rotation. Strength is preserved with empty can,  though Ultrasound does demonstrate increased signal intensity, suggestive of tendinopathy.  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  - Encouraged to follow up with PT (Is known to [facility], however states he occasionally has trouble getting appointments. Will also provide referral to [facility]) - Voltaren Gel PRN - F/U if no improvement with above in 4-6 weeks, at which time would consider injection therapy - Patient had no further questions or concerns, was in agreement with plan.      Procedures: No procedures performed  No notes on file     PMFS History: Patient Active Problem List   Diagnosis Date Noted  . 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 H/O aortic valve replacement 03/14/2018  . 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/2017  . Aortic valve replaced 11/21/2016  . Ascending aortic aneurysm ([place]) 02/14/2016  . Lymphadenopathy   . S/P aortic valve replacement with metallic valve 63/84/6659  . Glenohumeral arthritis 07/28/2013  . Hyperlipidemia, unspecified 04/21/2009  . GOUT, UNSPECIFIED 04/21/2009  . Essential hypertension 04/21/2009  . EXTERNAL HEMORRHOIDS 04/21/2009   Past Medical History:  Diagnosis Date  . Aortic valve disease   . Arthritis   . Ascending aortic aneurysm ([place]) 02/14/2016   5 cm in 2015 // Chest CT 08/28/16:  Ascending thoracic aortic aneurysm 5.5 cm, Thoracic and abdominal aortic atherosclerosis, Remote granulomatous disease  . Colon polyp   . Gout   . Heart murmur    as a younger person,   . Hyperlipidemia   . Hypertension   . 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 S/P aortic valve replacement with metallic valve 93/57/0177   Due to congenital aortic stenosis // [name] mechanical valve done in 1982 // Echo 09/10/16: Mild focal basal septal hypertrophy, [name], [name], [name], mod aortic root  enlargement, aortic root 36 mm, ascending aorta 49 mm, [name], [name]    Family History  Problem Relation Age of Onset  . Hyperlipidemia Mother   . Colon cancer Mother   . Emphysema Father   . Other Father        respiratory failure  . Diabetes Maternal Grandfather   . Colon polyps Neg Hx   . Kidney disease Neg Hx   . Esophageal cancer Neg Hx   . Gallbladder disease Neg Hx     Past Surgical History:  Procedure Laterality Date  . Aortic valve  1982   prosthetic  val ve  . AORTIC VALVE REPLACEMENT N/A 11/21/2016   Procedure: REDO AORTIC VALVE REPLACEMENT (AVR);  Surgeon: Melrose Nakayama, MD;  Location: [facility];  Service: Open Heart Surgery;  Laterality: N/A;  . CARDIAC CATHETERIZATION    . CORONARY ARTERY BYPASS GRAFT N/A 11/21/2016   Procedure: CORONARY ARTERY BYPASS GRAFTING (CABG) x 2, using right leg greater saphenous vein harvested endoscopically;  Surgeon: Melrose Nakayama, MD;  Location: [facility];  Service: Open Heart Surgery;  Laterality: N/A;  . cyst removed  1976   right side of neck  . PACEMAKER IMPLANT N/A 11/26/2016   Procedure: Pacemaker Implant;  Surgeon: Deboraha Sprang, MD;  Location: [facility];  Service: Cardiovascular;  Laterality: N/A;  . TEE WITHOUT CARDIOVERSION N/A 11/21/2016   Procedure: TRANSESOPHAGEAL ECHOCARDIOGRAM (TEE);  Surgeon: Melrose Nakayama, MD;  Location: [facility];  Service: Open Heart Surgery;  Laterality: N/A;  . THORACIC AORTIC ANEURYSM REPAIR N/A 11/21/2016   Procedure: THORACIC ASCENDING ANEURYSM REPAIR (AAA);  Surgeon: Melrose Nakayama, MD;  Location: [facility];  Service: Open Heart Surgery;  Laterality: N/A;  . TONSILLECTOMY     Social History   Occupational History  . Occupation: Retired  Tobacco Use  . Smoking status: Never Smoker  . Smokeless tobacco: Never Used  Vaping Use  . Vaping Use: Never used  Substance and Sexual Activity  . Alcohol use: Yes    Alcohol/week: 0.0 standard drinks    Comment: 2 drinks a day  .  Drug use: No  . Sexual activity: Not on file

## 2020-02-24 NOTE — Progress Notes (Signed)
I saw and examined the patient with Dr. Elouise Munroe and agree with assessment and plan as outlined.    Left shoulder pain with crepitus and decreased ROM.    Exam reveals good rotator cuff strength with palpable crepitus.    Diagnostic ultrasound does not reveal any obvious tears.  Will try voltaren gel, PT.   Consider glenohumeral injection if persists.

## 2020-03-10 ENCOUNTER — Ambulatory Visit (INDEPENDENT_AMBULATORY_CARE_PROVIDER_SITE_OTHER): Payer: Medicare Other

## 2020-03-10 DIAGNOSIS — I442 Atrioventricular block, complete: Secondary | ICD-10-CM | POA: Diagnosis not present

## 2020-03-10 LAB — CUP PACEART REMOTE DEVICE CHECK
Battery Remaining Longevity: 102 mo
Battery Voltage: 2.99 V
Brady Statistic AP VP Percent: 34.84 %
Brady Statistic AP VS Percent: 0 %
Brady Statistic AS VP Percent: 65.1 %
Brady Statistic AS VS Percent: 0.07 %
Brady Statistic RA Percent Paced: 34.53 %
Brady Statistic RV Percent Paced: 99.93 %
Date Time Interrogation Session: 20211110235722
Implantable Lead Implant Date: 20180730
Implantable Lead Implant Date: 20180730
Implantable Lead Location: 753859
Implantable Lead Location: 753860
Implantable Lead Model: 5076
Implantable Lead Model: 5076
Implantable Pulse Generator Implant Date: 20180730
Lead Channel Impedance Value: 304 Ohm
Lead Channel Impedance Value: 437 Ohm
Lead Channel Impedance Value: 513 Ohm
Lead Channel Impedance Value: 570 Ohm
Lead Channel Pacing Threshold Amplitude: 0.375 V
Lead Channel Pacing Threshold Amplitude: 0.625 V
Lead Channel Pacing Threshold Pulse Width: 0.4 ms
Lead Channel Pacing Threshold Pulse Width: 0.4 ms
Lead Channel Sensing Intrinsic Amplitude: 1.125 mV
Lead Channel Sensing Intrinsic Amplitude: 1.125 mV
Lead Channel Sensing Intrinsic Amplitude: 11.125 mV
Lead Channel Sensing Intrinsic Amplitude: 11.125 mV
Lead Channel Setting Pacing Amplitude: 1.5 V
Lead Channel Setting Pacing Amplitude: 2.5 V
Lead Channel Setting Pacing Pulse Width: 0.4 ms
Lead Channel Setting Sensing Sensitivity: 4 mV

## 2020-03-14 NOTE — Progress Notes (Signed)
Remote pacemaker transmission.   

## 2020-04-24 ENCOUNTER — Other Ambulatory Visit: Payer: Self-pay | Admitting: Cardiovascular Disease

## 2020-05-27 ENCOUNTER — Encounter: Payer: Self-pay | Admitting: Cardiovascular Disease

## 2020-05-27 ENCOUNTER — Ambulatory Visit: Payer: Medicare Other | Admitting: Cardiovascular Disease

## 2020-05-27 ENCOUNTER — Other Ambulatory Visit: Payer: Self-pay

## 2020-05-27 VITALS — BP 156/72 | HR 64 | Ht 68.0 in | Wt 173.0 lb

## 2020-05-27 DIAGNOSIS — I359 Nonrheumatic aortic valve disorder, unspecified: Secondary | ICD-10-CM

## 2020-05-27 DIAGNOSIS — Z950 Presence of cardiac pacemaker: Secondary | ICD-10-CM | POA: Diagnosis not present

## 2020-05-27 DIAGNOSIS — Z954 Presence of other heart-valve replacement: Secondary | ICD-10-CM

## 2020-05-27 DIAGNOSIS — I442 Atrioventricular block, complete: Secondary | ICD-10-CM

## 2020-05-27 DIAGNOSIS — Z952 Presence of prosthetic heart valve: Secondary | ICD-10-CM | POA: Diagnosis not present

## 2020-05-27 NOTE — Patient Instructions (Signed)
Medication Instructions:  Your physician recommends that you continue on your current medications as directed. Please refer to the Current Medication list given to you today.  *If you need a refill on your cardiac medications before your next appointment, please call your pharmacy*   Lab Work: None Ordered If you have labs (blood work) drawn today and your tests are completely normal, you will receive your results only by: [name] Kitchen MyChart Message (if you have MyChart) OR . A paper copy in the mail If you have any lab test that is abnormal or we need to change your treatment, we will call you to review the results.   Testing/Procedures: Your physician has requested that you have an echocardiogram in 1 Year prior to office visit. Echocardiography is a painless test that uses sound waves to create images of your heart. It provides your doctor with information about the size and shape of your heart and how well your heart's chambers and valves are working. This procedure takes approximately one hour. There are no restrictions for this procedure.     Follow-Up: At [facility], you and your health needs are our priority.  As part of our continuing mission to provide you with exceptional heart care, we have created designated Provider Care Teams.  These Care Teams include your primary Cardiologist (physician) and Advanced Practice Providers (APPs -  Physician Assistants and Nurse Practitioners) who all work together to provide you with the care you need, when you need it.   Your next appointment:   1 year(s)  The format for your next appointment:   In Person  Provider:   You may see Mertie Moores, MD or one of the following Advanced Practice Providers on your designated Care Team:    Richardson Dopp, PA-C  [place], [place]

## 2020-05-27 NOTE — Progress Notes (Signed)
Cardiology Office Note   Date:  05/27/2020   ID:  Dakota Martin, Dakota Martin 1940-10-11, MRN [id number]  PCP:  Dakota Infante, MD  Cardiologist:   Mertie Moores, MD   Chief Complaint  Patient presents with  . Coronary Artery Disease  . Hypertension      August 17, 2014:  Dakota Martin is a 80 y.o. male who presents for follow-up of his essential hypertension and mild aortic root dilatation. We had HCTZ and Kdur at his last visit.  BP readings have been well controlled.    Expand All Collapse All     Problem List: 1. 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   2. Chronic coumadin therapy.  3. Hypertension 4. Hyperlipidemia 5. 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 He has been doing well. No CP , no dyspnea.  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   Oct. 27, 2015:  Dakota Martin is seen back for a scheduled visit. He had a recent echo that revealed a dilated aortic root ( 50 cm)     Jan. 19, 2016:  Dakota Martin  is seen back today for follow-up of his aortic valve replacement and history of hypertension. He has a dilated aortic root. Also has a history of hypertension and hyperlipidemia. He has been recording his BP daily. He has substituted generic Micardis ( Telmisartan)  for his Benicar  August 17, 2014:    Nov. 4, 2016:  Dakota Martin is seen back today for follow-up visit for his aortic valve replacement and aortic aneurysm. Doing great.  Feeling well.  Has seen Dr. Roxan Hockey - the ascending aortic aneurism seems stable .  Playing tennis regularly  - plays 2-3 times a week .  Going for a right neck lymph node bx.  Bridging with Lovenox.  ( Managed by Medical Resource Manager )   Sep 14, 2015:  Doing well. Playing tennis . Lots of yard work . Had a lymph node bx  last Nov.   Was benign.   Was cystic..  Drained it but it has returned.   Sept. 4, 2018:  Dakota Martin is doing great after his asc. Aortic aneurism repair / AVR ( bioprosthetic ) / CABG  Had a pacer placed later in the hospitalization.  Will follow up with Dr. Lovena Le  Overall doing well.   His appetite has not fully recovered.  Has minimal chest wall tenderness.   The plan is to stop his coumadin for another month - INR levels are managed by MRM at [facility] office.  He asked about the statin   March 14, 2018:.  Is doing well after his a sending aortic aneurysm repair/aortic valve replacement.  He also has coronary artery bypass grafting.  He had a pacemaker placed later that hospitalization.  Active.  Plays tennis - 3 times a week .  No CP or dyspnea  Recent lipids  look great Chol = 115 HDL = 38 LDL = 62 Trigs = 73 His ALT was a little high.  He will follow-up with Dr. Joylene Draft on that.  Dec. 9, 2020    Doing well. No cp or dyspnea Plays indoor tennis ( has been trying to wear his mask )  No CP or dyspnea.   Labs from Dr.  Parini's office March 24, 2019: Cholesterol is 138.  HDL is 49.  LDL 74.  Triglyceride level 77.  Hemoglobin is 15.1.  Creatinine is 1.1. BP is well controlled at home Watches his salt intake   Jan. 28, 2022: BP is well controlled. At home  No CP or dyspnea  Last echo in May, 2018 showed normal LV function .  He has a 4.0 ascending aortic aneurysm that is being followed by Dr. Roxan Hockey.  Past Medical History:  Diagnosis Date  . Aortic valve disease   . Arthritis   . Ascending aortic aneurysm ([place]) 02/14/2016   5 cm in 2015 // Chest CT 08/28/16:  Ascending thoracic aortic aneurysm 5.5 cm, Thoracic and abdominal aortic atherosclerosis, Remote granulomatous disease  . Colon polyp   . Gout   . Heart murmur    as a younger person,   . Hyperlipidemia   . Hypertension   . 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 S/P aortic valve replacement with metallic valve 25/42/7062   Due to congenital aortic stenosis // [name] mechanical valve done in 1982 // Echo 09/10/16: Mild focal basal septal hypertrophy, [name], [name], [name], mod aortic root enlargement, aortic root 36 mm, ascending aorta 49 mm, [name], [name]    Past Surgical History:  Procedure Laterality Date  . Aortic valve  1982   prosthetic  val ve  . AORTIC VALVE REPLACEMENT N/A 11/21/2016   Procedure: REDO AORTIC VALVE REPLACEMENT (AVR);  Surgeon: Melrose Nakayama, MD;  Location: [facility];  Service: Open Heart Surgery;  Laterality: N/A;  . CARDIAC CATHETERIZATION    . CORONARY ARTERY BYPASS GRAFT N/A 11/21/2016   Procedure: CORONARY ARTERY BYPASS GRAFTING (CABG) x 2, using right leg greater saphenous vein harvested endoscopically;  Surgeon: Melrose Nakayama, MD;  Location: [facility];  Service: Open Heart Surgery;  Laterality: N/A;  . cyst removed  1976   right side of neck  . PACEMAKER IMPLANT N/A 11/26/2016   Procedure: Pacemaker Implant;  Surgeon: Deboraha Sprang, MD;  Location: [facility];  Service: Cardiovascular;  Laterality: N/A;  . TEE WITHOUT CARDIOVERSION N/A 11/21/2016   Procedure: TRANSESOPHAGEAL ECHOCARDIOGRAM (TEE);  Surgeon: Melrose Nakayama, MD;  Location: [facility];  Service: Open Heart Surgery;  Laterality: N/A;  . THORACIC AORTIC ANEURYSM REPAIR N/A 11/21/2016   Procedure: THORACIC ASCENDING ANEURYSM REPAIR (AAA);  Surgeon: Melrose Nakayama, MD;  Location: [facility];  Service: Open Heart Surgery;  Laterality: N/A;  . TONSILLECTOMY       Current Outpatient Medications  Medication Sig Dispense Refill  . allopurinol (ZYLOPRIM) 300 MG tablet Take 300 mg by mouth daily at 12 noon.     [name] Kitchen aspirin EC 81 MG EC tablet Take 1 tablet (81 mg total) by mouth daily.    [name] Kitchen atorvastatin (LIPITOR) 20 MG tablet Take 10 mg daily by mouth.    . carvedilol (COREG) 6.25 MG  tablet TAKE 1 TABLET BY MOUTH  TWICE DAILY WITH A MEAL 180 tablet 3  . cholecalciferol (VITAMIN D) 1000 units tablet Take 5,000 Units by mouth. Wednesday AND SATURDAY    . Coenzyme Q10 50 MG CAPS Take 50 mg by mouth daily with breakfast.    . Cyanocobalamin (VITAMIN B-12) 5000 MCG SUBL Place 5,000 mcg under the tongue 2 (two) times a week. Wednesday & Saturday.    [name] Kitchen  ezetimibe (ZETIA) 10 MG tablet Take 5 mg by mouth daily. In the morning.    . fish oil-omega-3 fatty acids 1000 MG capsule Take 1 g by mouth 2 (two) times daily.     [name] Kitchen ibuprofen (ADVIL,MOTRIN) 200 MG tablet Take 200 mg by mouth as needed.    . Magnesium 500 MG CAPS Take 500 mg by mouth every evening.     [name] Kitchen telmisartan (MICARDIS) 80 MG tablet Take 80 mg by mouth daily. In the morning.  11   No current facility-administered medications for this visit.    Allergies:   Patient has no known allergies.    Social History:  The patient  reports that he has never smoked. He has never used smokeless tobacco. He reports current alcohol use. He reports that he does not use drugs.   Family History:  The patient's family history includes Colon cancer in his mother; Diabetes in his maternal grandfather; Emphysema in his father; Hyperlipidemia in his mother; Other in his father.    ROS:  Please see the history of present illness.   Otherwise, review of systems are positive for none.   All other systems are reviewed and negative.    Physical Exam: Blood pressure (!) 156/72, pulse 64, height _0  (1.727 m), weight 173 lb (78.5 kg), SpO2 99 %.  GEN:  Well nourished, well developed in no acute distress HEENT: Normal NECK: No JVD; No carotid bruits LYMPHATICS: No lymphadenopathy CARDIAC: RRR ,  Soft murmur  RESPIRATORY:  Clear to auscultation without rales, wheezing or rhonchi  ABDOMEN: Soft, non-tender, non-distended MUSCULOSKELETAL:  No edema; No deformity  SKIN: Warm and dry NEUROLOGIC:  Alert and oriented x 3   EKG:      Recent  Labs: No results found for requested labs within last 8760 hours.    Lipid Panel No results found for: CHOL, TRIG, HDL, CHOLHDL, VLDL, LDLCALC, LDLDIRECT    Wt Readings from Last 3 Encounters:  05/27/20 173 lb (78.5 kg)  12/29/19 174 lb (78.9 kg)  12/04/19 174 lb (78.9 kg)      Other studies Reviewed:    ASSESSMENT AND PLAN:  1.  Essential Hypertension:   -  BP is well controlled.   2. Aortic valve replacement:   - valve sounds great , cont meds.   3. AV heart block:   -  S/p pacer   He is A - sensing and V pacing   4. Hyperlipidemia:     - managed by primary MD    Current medicines are reviewed at length with the patient today.  The patient does not have concerns regarding medicines.  Labs/ tests ordered today include:   Orders Placed This Encounter  Procedures  . EKG 12-Lead  . ECHOCARDIOGRAM COMPLETE    Disposition:   FU with me  In 1 year     Mertie Moores, MD  05/27/2020 5:55 PM    [address], [place], [place]  [address] Phone: [phone]; Fax: [phone]

## 2020-06-09 ENCOUNTER — Ambulatory Visit (INDEPENDENT_AMBULATORY_CARE_PROVIDER_SITE_OTHER): Payer: Medicare Other

## 2020-06-09 DIAGNOSIS — I442 Atrioventricular block, complete: Secondary | ICD-10-CM

## 2020-06-09 LAB — CUP PACEART REMOTE DEVICE CHECK
Battery Remaining Longevity: 99 mo
Battery Voltage: 2.98 V
Brady Statistic AP VP Percent: 32.49 %
Brady Statistic AP VS Percent: 0 %
Brady Statistic AS VP Percent: 67.47 %
Brady Statistic AS VS Percent: 0.05 %
Brady Statistic RA Percent Paced: 32.18 %
Brady Statistic RV Percent Paced: 99.95 %
Date Time Interrogation Session: 20220210000005
Implantable Lead Implant Date: 20180730
Implantable Lead Implant Date: 20180730
Implantable Lead Location: 753859
Implantable Lead Location: 753860
Implantable Lead Model: 5076
Implantable Lead Model: 5076
Implantable Pulse Generator Implant Date: 20180730
Lead Channel Impedance Value: 323 Ohm
Lead Channel Impedance Value: 456 Ohm
Lead Channel Impedance Value: 513 Ohm
Lead Channel Impedance Value: 570 Ohm
Lead Channel Pacing Threshold Amplitude: 0.375 V
Lead Channel Pacing Threshold Amplitude: 0.625 V
Lead Channel Pacing Threshold Pulse Width: 0.4 ms
Lead Channel Pacing Threshold Pulse Width: 0.4 ms
Lead Channel Sensing Intrinsic Amplitude: 1.125 mV
Lead Channel Sensing Intrinsic Amplitude: 1.125 mV
Lead Channel Sensing Intrinsic Amplitude: 11.125 mV
Lead Channel Sensing Intrinsic Amplitude: 11.125 mV
Lead Channel Setting Pacing Amplitude: 1.5 V
Lead Channel Setting Pacing Amplitude: 2.5 V
Lead Channel Setting Pacing Pulse Width: 0.4 ms
Lead Channel Setting Sensing Sensitivity: 4 mV

## 2020-06-10 ENCOUNTER — Telehealth: Payer: Self-pay

## 2020-06-10 DIAGNOSIS — M7542 Impingement syndrome of left shoulder: Secondary | ICD-10-CM

## 2020-06-10 NOTE — Telephone Encounter (Signed)
Pt called asking about a Xray of left shoulder.  Pt didn't want to make an apt he wanted me to leave a message.

## 2020-06-10 NOTE — Telephone Encounter (Signed)
X-Ray ordered for [facility].

## 2020-06-10 NOTE — Telephone Encounter (Signed)
The patient says his shoulder is not doing much better. Kym Groom suggested he have an xray for further evaluation. The patient would like to have just the xray, unless it is better that he come in for an ov/xray visit. If just an xray is ordered, the patient is fine with either coming here or going to [facility]. Please advise.

## 2020-06-13 ENCOUNTER — Telehealth: Payer: Self-pay | Admitting: Family Medicine

## 2020-06-13 ENCOUNTER — Other Ambulatory Visit: Payer: Self-pay

## 2020-06-13 DIAGNOSIS — M7542 Impingement syndrome of left shoulder: Secondary | ICD-10-CM

## 2020-06-13 NOTE — Telephone Encounter (Signed)
error 

## 2020-06-13 NOTE — Telephone Encounter (Signed)
I called and left voice mail that the xray has been ordered at [facility]. The patient can walk in whenever is convenient for him. The results will be sent to Dr. Junius Roads and the patient will then be notified afterward.

## 2020-06-14 ENCOUNTER — Ambulatory Visit
Admission: RE | Admit: 2020-06-14 | Discharge: 2020-06-14 | Disposition: A | Discharge status: Home / Self Care | Payer: Medicare Other | Source: Ambulatory Visit | Attending: Family Medicine | Admitting: Family Medicine

## 2020-06-14 ENCOUNTER — Telehealth: Payer: Self-pay | Admitting: Family Medicine

## 2020-06-14 DIAGNOSIS — M7542 Impingement syndrome of left shoulder: Secondary | ICD-10-CM

## 2020-06-14 NOTE — Telephone Encounter (Signed)
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  Could consider injection if pain persists.

## 2020-06-15 NOTE — Progress Notes (Signed)
Remote pacemaker transmission.   

## 2020-09-08 ENCOUNTER — Ambulatory Visit (INDEPENDENT_AMBULATORY_CARE_PROVIDER_SITE_OTHER): Payer: Medicare Other

## 2020-09-08 DIAGNOSIS — I442 Atrioventricular block, complete: Secondary | ICD-10-CM

## 2020-09-08 LAB — CUP PACEART REMOTE DEVICE CHECK
Battery Remaining Longevity: 92 mo
Battery Voltage: 2.98 V
Brady Statistic AP VP Percent: 33.69 %
Brady Statistic AP VS Percent: 0 %
Brady Statistic AS VP Percent: 66.26 %
Brady Statistic AS VS Percent: 0.05 %
Brady Statistic RA Percent Paced: 33.38 %
Brady Statistic RV Percent Paced: 99.95 %
Date Time Interrogation Session: 20220512005637
Implantable Lead Implant Date: 20180730
Implantable Lead Implant Date: 20180730
Implantable Lead Location: 753859
Implantable Lead Location: 753860
Implantable Lead Model: 5076
Implantable Lead Model: 5076
Implantable Pulse Generator Implant Date: 20180730
Lead Channel Impedance Value: 342 Ohm
Lead Channel Impedance Value: 475 Ohm
Lead Channel Impedance Value: 475 Ohm
Lead Channel Impedance Value: 513 Ohm
Lead Channel Pacing Threshold Amplitude: 0.375 V
Lead Channel Pacing Threshold Amplitude: 0.625 V
Lead Channel Pacing Threshold Pulse Width: 0.4 ms
Lead Channel Pacing Threshold Pulse Width: 0.4 ms
Lead Channel Sensing Intrinsic Amplitude: 1.125 mV
Lead Channel Sensing Intrinsic Amplitude: 1.125 mV
Lead Channel Sensing Intrinsic Amplitude: 11.125 mV
Lead Channel Sensing Intrinsic Amplitude: 11.125 mV
Lead Channel Setting Pacing Amplitude: 1.5 V
Lead Channel Setting Pacing Amplitude: 2.5 V
Lead Channel Setting Pacing Pulse Width: 0.4 ms
Lead Channel Setting Sensing Sensitivity: 4 mV

## 2020-09-09 ENCOUNTER — Other Ambulatory Visit: Payer: Self-pay | Admitting: Cardiovascular Disease

## 2020-09-30 NOTE — Progress Notes (Signed)
Remote pacemaker transmission.   

## 2020-10-03 ENCOUNTER — Other Ambulatory Visit: Payer: Self-pay | Admitting: Family Medicine

## 2020-10-03 DIAGNOSIS — M25572 Pain in left ankle and joints of left foot: Secondary | ICD-10-CM

## 2020-10-28 ENCOUNTER — Other Ambulatory Visit: Payer: Self-pay | Admitting: *Deleted

## 2020-10-28 DIAGNOSIS — I712 Thoracic aortic aneurysm, without rupture, unspecified: Secondary | ICD-10-CM

## 2020-11-03 ENCOUNTER — Telehealth: Payer: Self-pay

## 2020-11-03 NOTE — Telephone Encounter (Signed)
The patient called to get help with his monitor. The lights are flashing and not going all the way thru. I called [employer] tech support to get additional help. The patient is going to receive a new reader.

## 2020-12-08 ENCOUNTER — Ambulatory Visit (INDEPENDENT_AMBULATORY_CARE_PROVIDER_SITE_OTHER): Payer: Medicare Other

## 2020-12-08 DIAGNOSIS — I442 Atrioventricular block, complete: Secondary | ICD-10-CM

## 2020-12-08 LAB — CUP PACEART REMOTE DEVICE CHECK
Battery Remaining Longevity: 90 mo
Battery Voltage: 2.98 V
Brady Statistic AP VP Percent: 37.69 %
Brady Statistic AP VS Percent: 0 %
Brady Statistic AS VP Percent: 62.24 %
Brady Statistic AS VS Percent: 0.08 %
Brady Statistic RA Percent Paced: 37.33 %
Brady Statistic RV Percent Paced: 99.92 %
Date Time Interrogation Session: 20220811010124
Implantable Lead Implant Date: 20180730
Implantable Lead Implant Date: 20180730
Implantable Lead Location: 753859
Implantable Lead Location: 753860
Implantable Lead Model: 5076
Implantable Lead Model: 5076
Implantable Pulse Generator Implant Date: 20180730
Lead Channel Impedance Value: 323 Ohm
Lead Channel Impedance Value: 456 Ohm
Lead Channel Impedance Value: 513 Ohm
Lead Channel Impedance Value: 551 Ohm
Lead Channel Pacing Threshold Amplitude: 0.5 V
Lead Channel Pacing Threshold Amplitude: 0.5 V
Lead Channel Pacing Threshold Pulse Width: 0.4 ms
Lead Channel Pacing Threshold Pulse Width: 0.4 ms
Lead Channel Sensing Intrinsic Amplitude: 1.125 mV
Lead Channel Sensing Intrinsic Amplitude: 1.125 mV
Lead Channel Sensing Intrinsic Amplitude: 11.125 mV
Lead Channel Sensing Intrinsic Amplitude: 11.125 mV
Lead Channel Setting Pacing Amplitude: 1.5 V
Lead Channel Setting Pacing Amplitude: 2.5 V
Lead Channel Setting Pacing Pulse Width: 0.4 ms
Lead Channel Setting Sensing Sensitivity: 4 mV

## 2020-12-22 ENCOUNTER — Other Ambulatory Visit: Payer: Medicare Other

## 2020-12-23 ENCOUNTER — Other Ambulatory Visit: Payer: Self-pay

## 2020-12-23 ENCOUNTER — Ambulatory Visit
Admission: RE | Admit: 2020-12-23 | Discharge: 2020-12-23 | Disposition: A | Discharge status: Home / Self Care | Payer: Medicare Other | Source: Ambulatory Visit | Attending: Thoracic Surgery (Cardiothoracic Vascular Surgery) | Admitting: Thoracic Surgery (Cardiothoracic Vascular Surgery)

## 2020-12-23 DIAGNOSIS — I712 Thoracic aortic aneurysm, without rupture, unspecified: Secondary | ICD-10-CM

## 2020-12-27 ENCOUNTER — Ambulatory Visit: Payer: Medicare Other | Admitting: Thoracic Surgery (Cardiothoracic Vascular Surgery)

## 2020-12-27 ENCOUNTER — Ambulatory Visit: Payer: Medicare Other

## 2020-12-29 NOTE — Progress Notes (Signed)
Remote pacemaker transmission.   

## 2021-01-10 ENCOUNTER — Ambulatory Visit: Payer: Medicare Other | Admitting: Thoracic Surgery (Cardiothoracic Vascular Surgery)

## 2021-01-10 ENCOUNTER — Encounter: Payer: Self-pay | Admitting: Thoracic Surgery (Cardiothoracic Vascular Surgery)

## 2021-01-10 ENCOUNTER — Other Ambulatory Visit: Payer: Self-pay

## 2021-01-10 VITALS — BP 149/72 | HR 71 | Resp 20 | Ht 68.0 in | Wt 165.0 lb

## 2021-01-10 DIAGNOSIS — I712 Thoracic aortic aneurysm, without rupture: Secondary | ICD-10-CM | POA: Diagnosis not present

## 2021-01-10 DIAGNOSIS — I7121 Aneurysm of the ascending aorta, without rupture: Secondary | ICD-10-CM

## 2021-01-10 NOTE — Progress Notes (Signed)
[address]       [place] [address]             [phone]     HPI: Dakota Martin returns for a scheduled follow-up visit  Dakota Martin is a 80 year old man with a past history of aortic stenosis, aortic valve replacement, ascending aneurysm, coronary artery disease, redo AVR with repair of proximal ascending aorta and coronary bypass grafting x2 in 2018, hypertension, hyperlipidemia, and aortic atherosclerosis.  He had a AVR with a Bjork Shiley valve in 1982.  He developed an enlarging aortic root.  He had a biologic Bentall with a bovine pericardial valve and coronary bypass grafting x2 in 2018.  He is the followed for mild aneurysmal dilatation of the distal ascending aorta.  He has been feeling well.  He remains active.  He played tennis this morning.  He denies any chest pain, pressure, tightness, or shortness of breath.  Chronic swelling in the leg saphenous vein harvest, but no new swelling.  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       aspirin EC 81 MG EC tablet Take 1 tablet (81 mg total) by mouth daily.     atorvastatin (LIPITOR) 20 MG tablet Take 10 mg daily by mouth.     carvedilol (COREG) 6.25 MG tablet TAKE 1 TABLET BY MOUTH  TWICE DAILY WITH A MEAL 180 tablet 3   cholecalciferol (VITAMIN D) 1000 units tablet Take 5,000 Units by mouth. Wednesday AND SATURDAY     Coenzyme Q10 50 MG CAPS Take 100 mg by mouth daily with breakfast.     Cyanocobalamin (VITAMIN B-12) 5000 MCG SUBL Place 5,000 mcg under the tongue 2 (two) times a week. Wednesday & Saturday.     ezetimibe (ZETIA) 10 MG tablet Take 5 mg by mouth daily. In the morning.     fish oil-omega-3 fatty acids 1000 MG capsule Take 1 g by mouth 2 (two) times daily.      ibuprofen (ADVIL,MOTRIN) 200 MG tablet Take 200 mg by mouth as needed.     Magnesium 500 MG CAPS Take 500 mg by mouth every evening.      telmisartan (MICARDIS) 80 MG tablet Take 80 mg by mouth daily. In the evening  11   No current facility-administered medications for this visit.    Physical Exam BP (!) 149/72 (BP Location: Left Arm, Patient Position: Sitting, Cuff Size: Normal)   Pulse 71  Resp 20   Ht _0  (1.727 m)   Wt 165 lb (74.8 kg)   SpO2 99% Comment: RA  BMI 25.44 kg/m  80 year old man in no acute distress Alert and oriented x3 with no focal deficits Cardiac regular rate and rhythm with a faint systolic murmur Lungs clear with equal breath sounds bilaterally No peripheral edema  Diagnostic Tests: CT CHEST WITHOUT CONTRAST   TECHNIQUE: Multidetector CT imaging of the chest was performed following the standard protocol without IV contrast.   COMPARISON:  12/14/2019   FINDINGS: Cardiovascular: Aortic atherosclerosis. Status post aortic valve replacement and Bentall type graft repair of the ascending thoracic aorta. Unchanged enlargement of the remaining anatomic ascending thoracic aorta, measuring 4.0 x 4.0 cm. Normal caliber of the descending thoracic aorta, measuring up  to 2.1 x 2.0 cm. Normal heart size. Extensive 3 vessel coronary artery calcifications and/or stents. No pericardial effusion.   Mediastinum/Nodes: No enlarged mediastinal, hilar, or axillary lymph nodes. Thyroid gland, trachea, and esophagus demonstrate no significant findings.   Lungs/Pleura: Lungs are clear. No pleural effusion or pneumothorax.   Upper Abdomen: No acute abnormality. Numerous unchanged low-attenuation liver cysts.   Musculoskeletal: No chest wall mass or suspicious bone lesions identified.   IMPRESSION: 1. Status post aortic valve replacement and Bentall type graft repair of the ascending thoracic aorta.   2. Unchanged enlargement of the remaining anatomic ascending thoracic aorta, measuring 4.0 x 4.0 cm. Normal caliber of the descending thoracic aorta, measuring up to 2.1 x 2.0 cm.   3.  Coronary artery disease.   Aortic Atherosclerosis (ICD10-I70.0).     Electronically Signed   By: Eddie Candle M.D.   On: 12/23/2020 11:13  I personally reviewed the CT images.  There has been no interval change in the distal ascending aorta.  Impression: Dakota Martin is a 80 year old man with a past history of aortic stenosis, aortic valve replacement, ascending aneurysm, coronary artery disease, redo AVR with repair of proximal ascending aorta and coronary bypass grafting x2 in 2018, hypertension, hyperlipidemia, and aortic atherosclerosis.  Ascending aneurysm-distal ascending aorta stable at 4 cm.  Needs continued annual follow-up.  Thoracic aortic atherosclerosis-extensive calcific plaque throughout the aorta.  He is on statin.  CAD-status post CABG x2 at time of redo aortic valve surgery.  No anginal symptoms.  Plan: I will see Mr. Faries back in a year with a CT of the chest.  We will plan to do that without contrast.  Melrose Nakayama, MD [facility] [phone]

## 2021-02-08 ENCOUNTER — Telehealth: Payer: Self-pay

## 2021-02-08 NOTE — Telephone Encounter (Signed)
I spoke with the patient and he agreed not to push the button anymore.

## 2021-03-09 ENCOUNTER — Ambulatory Visit (INDEPENDENT_AMBULATORY_CARE_PROVIDER_SITE_OTHER): Payer: Medicare Other

## 2021-03-09 DIAGNOSIS — I442 Atrioventricular block, complete: Secondary | ICD-10-CM

## 2021-03-09 LAB — CUP PACEART REMOTE DEVICE CHECK
Battery Remaining Longevity: 84 mo
Battery Voltage: 2.97 V
Brady Statistic AP VP Percent: 36.88 %
Brady Statistic AP VS Percent: 0 %
Brady Statistic AS VP Percent: 63 %
Brady Statistic AS VS Percent: 0.12 %
Brady Statistic RA Percent Paced: 36.51 %
Brady Statistic RV Percent Paced: 99.88 %
Date Time Interrogation Session: 20221110000040
Implantable Lead Implant Date: 20180730
Implantable Lead Implant Date: 20180730
Implantable Lead Location: 753859
Implantable Lead Location: 753860
Implantable Lead Model: 5076
Implantable Lead Model: 5076
Implantable Pulse Generator Implant Date: 20180730
Lead Channel Impedance Value: 304 Ohm
Lead Channel Impedance Value: 437 Ohm
Lead Channel Impedance Value: 475 Ohm
Lead Channel Impedance Value: 532 Ohm
Lead Channel Pacing Threshold Amplitude: 0.5 V
Lead Channel Pacing Threshold Amplitude: 0.625 V
Lead Channel Pacing Threshold Pulse Width: 0.4 ms
Lead Channel Pacing Threshold Pulse Width: 0.4 ms
Lead Channel Sensing Intrinsic Amplitude: 1.125 mV
Lead Channel Sensing Intrinsic Amplitude: 1.125 mV
Lead Channel Sensing Intrinsic Amplitude: 20 mV
Lead Channel Sensing Intrinsic Amplitude: 20 mV
Lead Channel Setting Pacing Amplitude: 1.5 V
Lead Channel Setting Pacing Amplitude: 2.5 V
Lead Channel Setting Pacing Pulse Width: 0.4 ms
Lead Channel Setting Sensing Sensitivity: 4 mV

## 2021-03-17 NOTE — Progress Notes (Signed)
Remote pacemaker transmission.   

## 2021-04-17 ENCOUNTER — Ambulatory Visit: Payer: Medicare Other | Admitting: Internal Medicine

## 2021-04-17 ENCOUNTER — Encounter: Payer: Self-pay | Admitting: Internal Medicine

## 2021-04-17 ENCOUNTER — Other Ambulatory Visit: Payer: Self-pay

## 2021-04-17 VITALS — BP 142/76 | HR 67 | Ht 68.0 in | Wt 173.6 lb

## 2021-04-17 DIAGNOSIS — I10 Essential (primary) hypertension: Secondary | ICD-10-CM

## 2021-04-17 DIAGNOSIS — Z950 Presence of cardiac pacemaker: Secondary | ICD-10-CM

## 2021-04-17 DIAGNOSIS — I442 Atrioventricular block, complete: Secondary | ICD-10-CM

## 2021-04-17 HISTORY — DX: Presence of cardiac pacemaker: Z95.0

## 2021-04-17 NOTE — Patient Instructions (Addendum)
Medication Instructions:  Your physician recommends that you continue on your current medications as directed. Please refer to the Current Medication list given to you today.  Labwork: None ordered.  Testing/Procedures: None ordered.  Follow-Up: Your physician wants you to follow-up in: one year with one of the following Advanced Practice Providers on your designated Care Team:   Tommye Standard, [place] Legrand Como "Jonni Sanger" Chalmers Cater, [place]  Remote monitoring is used to monitor your Pacemaker from home. This monitoring reduces the number of office visits required to check your device to one time per year. It allows [place] to keep an eye on the functioning of your device to ensure it is working properly. You are scheduled for a device check from home on 06/08/2021. You may send your transmission at any time that day. If you have a wireless device, the transmission will be sent automatically. After your physician reviews your transmission, you will receive a postcard with your next transmission date.  Any Other Special Instructions Will Be Listed Below (If Applicable).  If you need a refill on your cardiac medications before your next appointment, please call your pharmacy.

## 2021-04-17 NOTE — Progress Notes (Signed)
HPI [name] returns today for ongoing evaluation and management of complete heart block, status post permanent pacemaker insertion. This all occurred in the setting of aortic valve replacement. In the interim the patient has done well. We have not seen him in over 3 years. He denies chest pain or shortness of breath. He admits to some dietary indiscretion with sodium. No syncope. He remains active playing tennis.He has been busy caring for his brother who recently died. He notes that his BP is always higher in the doctors office.   No Known Allergies   Current Outpatient Medications  Medication Sig Dispense Refill   allopurinol (ZYLOPRIM) 300 MG tablet Take 300 mg by mouth daily at 12 noon.      aspirin EC 81 MG EC tablet Take 1 tablet (81 mg total) by mouth daily.     atorvastatin (LIPITOR) 20 MG tablet Take 10 mg daily by mouth.     carvedilol (COREG) 6.25 MG tablet TAKE 1 TABLET BY MOUTH  TWICE DAILY WITH A MEAL 180 tablet 3   cholecalciferol (VITAMIN D) 1000 units tablet Take 5,000 Units by mouth. Wednesday AND SATURDAY     Coenzyme Q10 50 MG CAPS Take 100 mg by mouth daily with breakfast.     Cyanocobalamin (VITAMIN B-12) 5000 MCG SUBL Place 5,000 mcg under the tongue 2 (two) times a week. Wednesday & Saturday.     ezetimibe (ZETIA) 10 MG tablet Take 5 mg by mouth daily. In the morning.     fish oil-omega-3 fatty acids 1000 MG capsule Take 1 g by mouth 2 (two) times daily.      ibuprofen (ADVIL,MOTRIN) 200 MG tablet Take 200 mg by mouth as needed.     Magnesium 500 MG CAPS Take 500 mg by mouth every evening.      telmisartan (MICARDIS) 80 MG tablet Take 80 mg by mouth daily. In the evening  11   No current facility-administered medications for this visit.     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   Past Surgical History:  Procedure Laterality Date   Aortic valve  1982   prosthetic  val ve   AORTIC VALVE REPLACEMENT N/A 11/21/2016   Procedure: REDO AORTIC VALVE REPLACEMENT (AVR);  Surgeon: Dakota Nakayama, MD;  Location: [facility];  Service: Open Heart Surgery;  Laterality: N/A;   CARDIAC CATHETERIZATION     CORONARY ARTERY BYPASS GRAFT N/A 11/21/2016   Procedure: CORONARY ARTERY BYPASS GRAFTING (CABG) x 2, using right leg greater  saphenous vein harvested endoscopically;  Surgeon: Dakota Nakayama, MD;  Location: [facility];  Service: Open Heart Surgery;  Laterality: N/A;   cyst removed  1976   right side of neck   PACEMAKER IMPLANT N/A 11/26/2016   Procedure: Pacemaker Implant;  Surgeon: Dakota Sprang, MD;  Location: [facility];  Service: Cardiovascular;  Laterality: N/A;   TEE WITHOUT CARDIOVERSION N/A 11/21/2016   Procedure: TRANSESOPHAGEAL ECHOCARDIOGRAM (TEE);  Surgeon: Dakota Nakayama, MD;  Location: [facility];  Service: Open Heart Surgery;  Laterality: N/A;   THORACIC AORTIC ANEURYSM REPAIR N/A 11/21/2016   Procedure: THORACIC ASCENDING ANEURYSM REPAIR (AAA);  Surgeon: Dakota Nakayama, MD;  Location: [facility];  Service: Open Heart Surgery;  Laterality: N/A;   TONSILLECTOMY       Family  History  Problem Relation Age of Onset   Hyperlipidemia Mother    Colon cancer Mother    Emphysema Father    Other Father        respiratory failure   Diabetes Maternal Grandfather    Colon polyps Neg Hx    Kidney disease Neg Hx    Esophageal cancer Neg Hx    Gallbladder disease Neg Hx      Social History   Socioeconomic History   Marital status: Married    Spouse name: Not on file   Number of children: 2   Years of education: Not on file   Highest education level: Not on file  Occupational History   Occupation: Retired  Tobacco Use   Smoking status: Never   Smokeless tobacco: Never  Vaping Use   Vaping Use: Never used  Substance and Sexual Activity   Alcohol use: Yes    Alcohol/week: 0.0 standard drinks    Comment: 2 drinks a day   Drug use: No   Sexual activity: Not on file  Other Topics Concern   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, clinical   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   Gym 1x wk   Yoga 1 x wk   Social Determinants of Radio broadcast assistant Strain: Not on file  Food Insecurity: Not on file  Transportation Needs: Not on file  Physical Activity: Not on file  Stress: Not on file  Social Connections: Not on file  Intimate Partner Violence: Not on file     Ht _0  (1.727 m)    Wt 173 lb 9.6 oz (78.7 kg)    BMI 26.40 kg/m   Physical Exam:  Well appearing 80 yo man, NAD HEENT: Unremarkable Neck:  No JVD, no thyromegally Lymphatics:  No adenopathy Back:  No CVA tenderness Lungs:  Clear  with no wheezes HEART:  Regular rate rhythm, no murmurs, no rubs, no clicks Abd:  soft, positive bowel sounds, no organomegally, no rebound, no guarding Ext:  2 plus pulses, no edema, no cyanosis, no clubbing Skin:  No rashes no nodules Neuro:  CN II through XII intact, motor grossly intact  EKG - nsr with ventricular pacing  DEVICE  Normal device function.  See PaceArt for details.   Assess/Plan:  1. CHB - he is  asymptomatic, s/p PPM insertion 2. PPM - his medtronic DDD PM is working normally. We will recheck in several months. 3. HTN - his blood pressure is well controlled. He will continue his current meds. 4. Dyslipidemia - Continue Zetia and lipitor   Coreon Simkins,M.D

## 2021-05-26 ENCOUNTER — Other Ambulatory Visit: Payer: Self-pay

## 2021-05-26 ENCOUNTER — Ambulatory Visit (HOSPITAL_COMMUNITY): Payer: Medicare Other | Attending: Cardiovascular Disease

## 2021-05-26 DIAGNOSIS — I359 Nonrheumatic aortic valve disorder, unspecified: Secondary | ICD-10-CM | POA: Diagnosis not present

## 2021-05-26 DIAGNOSIS — Z954 Presence of other heart-valve replacement: Secondary | ICD-10-CM | POA: Diagnosis not present

## 2021-05-26 LAB — ECHOCARDIOGRAM COMPLETE
AR max vel: 1.62 cm2
AV Area VTI: 1.59 cm2
AV Area mean vel: 1.47 cm2
AV Mean grad: 8 mmHg
AV Peak grad: 14.6 mmHg
Ao pk vel: 1.91 m/s
Area-P 1/2: 4.63 cm2
S' Lateral: 2.1 cm

## 2021-05-26 MED ORDER — PERFLUTREN LIPID MICROSPHERE
1.0000 mL | INTRAVENOUS | Status: AC | PRN
  Administered 2021-05-26: 3 mL via INTRAVENOUS

## 2021-06-08 ENCOUNTER — Ambulatory Visit (INDEPENDENT_AMBULATORY_CARE_PROVIDER_SITE_OTHER): Payer: Medicare Other

## 2021-06-08 DIAGNOSIS — I442 Atrioventricular block, complete: Secondary | ICD-10-CM

## 2021-06-08 LAB — CUP PACEART REMOTE DEVICE CHECK
Battery Remaining Longevity: 79 mo
Battery Voltage: 2.97 V
Brady Statistic AP VP Percent: 42.76 %
Brady Statistic AP VS Percent: 0 %
Brady Statistic AS VP Percent: 57.15 %
Brady Statistic AS VS Percent: 0.09 %
Brady Statistic RA Percent Paced: 42.3 %
Brady Statistic RV Percent Paced: 99.9 %
Date Time Interrogation Session: 20230209000143
Implantable Lead Implant Date: 20180730
Implantable Lead Implant Date: 20180730
Implantable Lead Location: 753859
Implantable Lead Location: 753860
Implantable Lead Model: 5076
Implantable Lead Model: 5076
Implantable Pulse Generator Implant Date: 20180730
Lead Channel Impedance Value: 323 Ohm
Lead Channel Impedance Value: 456 Ohm
Lead Channel Impedance Value: 475 Ohm
Lead Channel Impedance Value: 532 Ohm
Lead Channel Pacing Threshold Amplitude: 0.5 V
Lead Channel Pacing Threshold Amplitude: 0.625 V
Lead Channel Pacing Threshold Pulse Width: 0.4 ms
Lead Channel Pacing Threshold Pulse Width: 0.4 ms
Lead Channel Sensing Intrinsic Amplitude: 1.125 mV
Lead Channel Sensing Intrinsic Amplitude: 1.125 mV
Lead Channel Sensing Intrinsic Amplitude: 20 mV
Lead Channel Sensing Intrinsic Amplitude: 20 mV
Lead Channel Setting Pacing Amplitude: 1.5 V
Lead Channel Setting Pacing Amplitude: 2.5 V
Lead Channel Setting Pacing Pulse Width: 0.4 ms
Lead Channel Setting Sensing Sensitivity: 4 mV

## 2021-06-13 NOTE — Progress Notes (Signed)
Remote pacemaker transmission.   

## 2021-06-25 ENCOUNTER — Encounter: Payer: Self-pay | Admitting: Cardiovascular Disease

## 2021-06-25 NOTE — Progress Notes (Signed)
Cardiology Office Note   Date:  06/26/2021   ID:  Dakota Martin, Dakota Martin 1940-12-04, MRN [id number]  PCP:  Crist Infante, MD  Cardiologist:   Mertie Moores, MD   Chief Complaint  Patient presents with   Hypertension        aortic valve replacement      August 17, 2014:  Dakota Martin is a 81 y.o. male who presents for follow-up of his essential hypertension and mild aortic root dilatation. We had HCTZ and Kdur at his last visit.  BP readings have been well controlled.    Expand All Collapse All     Problem List: 1. 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   2. Chronic coumadin therapy.   3. Hypertension 4. Hyperlipidemia 5. 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   He has been doing well.  No CP , no dyspnea.    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    Oct. 27, 2015:  Dakota Martin  is seen back for a scheduled visit. He had a recent echo that revealed a dilated aortic root ( 50 cm)     Jan. 19, 2016:  Dakota Martin  is seen back today for follow-up of his aortic valve replacement and history of hypertension. He has a dilated aortic root. Also has a history of hypertension and hyperlipidemia. He has been recording his BP daily. He has substituted generic Micardis ( Telmisartan)  for his Benicar  August 17, 2014:    Nov. 4, 2016:  Dakota Martin is seen back today for follow-up visit for his aortic valve replacement and aortic aneurysm. Doing great.  Feeling well.  Has seen Dr. Roxan Hockey - the ascending aortic aneurism seems stable .  Playing tennis regularly  - plays 2-3 times a week .  Going for a right neck lymph node bx.  Bridging with Lovenox.  ( Managed by Medical Resource Manager )   Sep 14, 2015:  Doing well. Playing tennis . Lots of yard work . Had a lymph node  bx last Nov.   Was benign.   Was cystic..  Drained it but it has returned.   Sept. 4, 2018:  Dakota Martin is doing great after his asc. Aortic aneurism repair / AVR ( bioprosthetic ) / CABG  Had a pacer placed later in the hospitalization.  Will follow up with Dr. Lovena Le  Overall doing well.   His appetite has not fully recovered.  Has minimal chest wall tenderness.   The plan is to stop his coumadin for another month - INR levels are managed by [facility] at [facility] office.  He asked about the statin   March 14, 2018:.  Is doing well after his a sending aortic aneurysm repair/aortic valve replacement.  He also has coronary artery bypass grafting.  He had a pacemaker placed later that hospitalization.  Active.  Plays tennis - 3 times a week .  No CP or dyspnea  Recent lipids  look great Chol = 115 HDL = 38 LDL = 62 Trigs = 73 His ALT was a little high.  He will follow-up with Dr. Joylene Draft on that.  Dec. 9, 2020    Doing well. No cp or dyspnea Plays indoor tennis ( has been trying to  wear his mask )  No CP or dyspnea.   Labs from Dr. Tempie Donning office March 24, 2019: Cholesterol is 138.  HDL is 49.  LDL 74.  Triglyceride level 77.  Hemoglobin is 15.1.  Creatinine is 1.1. BP is well controlled at home Watches his salt intake   Jan. 28, 2022: BP is well controlled. At home  No CP or dyspnea  Last echo in May, 2018 showed normal LV function .  He has a 4.0 ascending aortic aneurysm that is being followed by Dr. Roxan Hockey.  Feb. 27, 2023   Dakota Martin is seen today for follow up of his CABG, Bentall procedure ( July 201 additional8 to get the) I think you are doing great if you know your blood pressure and heart rate look good there you do have a soft murmur that goes to nNote here echocardiogram from May 26, 2021 shows normal left ventricular systolic function.  He has a bioprosthetic aortic valve with no perivalvular leak.  The gradients are low..    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, has slight loss of vision   S/P aortic valve replacement with metallic valve 41/96/2229   Due to congenital aortic stenosis // [name] mechanical valve done in 1982 // Echo 09/10/16: Mild focal basal septal hypertrophy, [name], [name], [name], mod aortic root enlargement, aortic root 36 mm, ascending aorta 49 mm, [name], [name]    Past Surgical History:  Procedure Laterality Date   Aortic valve  1982   prosthetic  val ve   AORTIC VALVE REPLACEMENT N/A 11/21/2016   Procedure: REDO AORTIC VALVE REPLACEMENT (AVR);  Surgeon: Melrose Nakayama, MD;  Location: [facility];  Service: Open Heart Surgery;  Laterality: N/A;   CARDIAC CATHETERIZATION     CORONARY ARTERY BYPASS GRAFT N/A 11/21/2016   Procedure: CORONARY ARTERY BYPASS GRAFTING (CABG) x 2, using right leg greater saphenous vein harvested endoscopically;  Surgeon: Melrose Nakayama, MD;  Location: [facility];  Service: Open Heart Surgery;  Laterality: N/A;   cyst removed  1976   right side of neck   PACEMAKER IMPLANT N/A 11/26/2016   Procedure: Pacemaker Implant;  Surgeon: Deboraha Sprang, MD;  Location: [facility];  Service: Cardiovascular;  Laterality: N/A;   TEE WITHOUT CARDIOVERSION N/A 11/21/2016   Procedure: TRANSESOPHAGEAL ECHOCARDIOGRAM (TEE);  Surgeon: Melrose Nakayama, MD;  Location: [facility];  Service: Open Heart Surgery;  Laterality: N/A;   THORACIC AORTIC ANEURYSM REPAIR N/A 11/21/2016   Procedure: THORACIC ASCENDING ANEURYSM REPAIR (AAA);  Surgeon: Melrose Nakayama, MD;   Location: [facility];  Service: Open Heart Surgery;  Laterality: N/A;   TONSILLECTOMY       Current Outpatient Medications  Medication Sig Dispense Refill   allopurinol (ZYLOPRIM) 300 MG tablet Take 300 mg by mouth daily at 12 noon.      aspirin EC 81 MG EC tablet Take 1 tablet (81 mg total) by mouth daily.  atorvastatin (LIPITOR) 20 MG tablet Take 10 mg daily by mouth.     carvedilol (COREG) 6.25 MG tablet TAKE 1 TABLET BY MOUTH  TWICE DAILY WITH A MEAL 180 tablet 3   cholecalciferol (VITAMIN D) 1000 units tablet Take 5,000 Units by mouth. Wednesday AND SATURDAY     Coenzyme Q10 50 MG CAPS Take 100 mg by mouth daily with breakfast.     Cyanocobalamin (VITAMIN B-12) 5000 MCG SUBL Place 5,000 mcg under the tongue 2 (two) times a week. Wednesday & Saturday.     ezetimibe (ZETIA) 10 MG tablet Take 5 mg by mouth daily. In the morning.     fish oil-omega-3 fatty acids 1000 MG capsule Take 1 g by mouth 2 (two) times daily.      ibuprofen (ADVIL,MOTRIN) 200 MG tablet Take 200 mg by mouth as needed.     Magnesium 500 MG CAPS Take 500 mg by mouth every evening.      telmisartan (MICARDIS) 80 MG tablet Take 80 mg by mouth daily. In the evening  11   No current facility-administered medications for this visit.    Allergies:   Patient has no known allergies.    Social History:  The patient  reports that he has never smoked. He has never used smokeless tobacco. He reports current alcohol use. He reports that he does not use drugs.   Family History:  The patient's family history includes Colon cancer in his mother; Diabetes in his maternal grandfather; Emphysema in his father; Hyperlipidemia in his mother; Other in his father.    ROS:  Please see the history of present illness.   Otherwise, review of systems are positive for none.   All other systems are reviewed and negative.    Physical Exam: Blood pressure 130/62, pulse 72, height _0  (1.727 m), weight 175 lb 12.8 oz (79.7 kg), SpO2 99  %.  GEN:  Well nourished, well developed in no acute distress HEENT: Normal NECK: No JVD; No carotid bruits LYMPHATICS: No lymphadenopathy CARDIAC: RRR , soft systolic murmur  RESPIRATORY:  Clear to auscultation without rales, wheezing or rhonchi  ABDOMEN: Soft, non-tender, non-distended MUSCULOSKELETAL:  No edema; No deformity  SKIN: Warm and dry NEUROLOGIC:  Alert and oriented x 3   EKG:      Recent Labs: No results found for requested labs within last 8760 hours.    Lipid Panel No results found for: CHOL, TRIG, HDL, CHOLHDL, VLDL, LDLCALC, LDLDIRECT    Wt Readings from Last 3 Encounters:  06/26/21 175 lb 12.8 oz (79.7 kg)  04/17/21 173 lb 9.6 oz (78.7 kg)  01/10/21 165 lb (74.8 kg)      Other studies Reviewed:    ASSESSMENT AND PLAN:  1.  Essential Hypertension:   -  BP looks good , cont current meds.   2. Aortic valve replacement:   -  valves sounds great   3. AV heart block:   - s/p pacer   4. Hyperlipidemia:    managed by [facility]     Current medicines are reviewed at length with the patient today.  The patient does not have concerns regarding medicines.  Labs/ tests ordered today include:   No orders of the defined types were placed in this encounter.   Disposition:   FU  In 1 year with APP or me     Mertie Moores, MD  06/26/2021 5:08 PM    [facility], [place]  [address] [place], [place]  [address] Phone: [phone]; Fax: [phone]

## 2021-06-26 ENCOUNTER — Ambulatory Visit: Payer: Medicare Other | Admitting: Cardiovascular Disease

## 2021-06-26 ENCOUNTER — Encounter: Payer: Self-pay | Admitting: Cardiovascular Disease

## 2021-06-26 ENCOUNTER — Other Ambulatory Visit: Payer: Self-pay

## 2021-06-26 VITALS — BP 130/62 | HR 72 | Ht 68.0 in | Wt 175.8 lb

## 2021-06-26 DIAGNOSIS — Z951 Presence of aortocoronary bypass graft: Secondary | ICD-10-CM

## 2021-06-26 DIAGNOSIS — Z954 Presence of other heart-valve replacement: Secondary | ICD-10-CM | POA: Diagnosis not present

## 2021-06-26 DIAGNOSIS — I10 Essential (primary) hypertension: Secondary | ICD-10-CM | POA: Diagnosis not present

## 2021-06-26 DIAGNOSIS — Z952 Presence of prosthetic heart valve: Secondary | ICD-10-CM | POA: Diagnosis not present

## 2021-06-26 NOTE — Patient Instructions (Signed)
Medication Instructions:  °The current medical regimen is effective;  continue present plan and medications. ° °*If you need a refill on your cardiac medications before your next appointment, please call your pharmacy* ° °Follow-Up: °At [facility], you and your health needs are our priority.  As part of our continuing mission to provide you with exceptional heart care, we have created designated Provider Care Teams.  These Care Teams include your primary Cardiologist (physician) and Advanced Practice Providers (APPs -  Physician Assistants and Nurse Practitioners) who all work together to provide you with the care you need, when you need it. ° °We recommend signing up for the patient portal called "MyChart".  Sign up information is provided on this After Visit Summary.  MyChart is used to connect with patients for Virtual Visits (Telemedicine).  Patients are able to view lab/test results, encounter notes, upcoming appointments, etc.  Non-urgent messages can be sent to your provider as well.   °To learn more about what you can do with MyChart, go to [email or web address].   ° °Your next appointment:   °1 year(s) ° °The format for your next appointment:   °In Person ° °Provider:   °Philip Nahser, MD   ° ° °Thank you for choosing [facility]!! ° ° ° °

## 2021-07-06 ENCOUNTER — Other Ambulatory Visit: Payer: Self-pay | Admitting: Cardiovascular Disease

## 2021-09-07 ENCOUNTER — Ambulatory Visit (INDEPENDENT_AMBULATORY_CARE_PROVIDER_SITE_OTHER): Payer: Medicare Other

## 2021-09-07 DIAGNOSIS — I442 Atrioventricular block, complete: Secondary | ICD-10-CM

## 2021-09-07 LAB — CUP PACEART REMOTE DEVICE CHECK
Battery Remaining Longevity: 72 mo
Battery Voltage: 2.97 V
Brady Statistic AP VP Percent: 34.24 %
Brady Statistic AP VS Percent: 0 %
Brady Statistic AS VP Percent: 65.63 %
Brady Statistic AS VS Percent: 0.13 %
Brady Statistic RA Percent Paced: 33.84 %
Brady Statistic RV Percent Paced: 99.87 %
Date Time Interrogation Session: 20230511010054
Implantable Lead Implant Date: 20180730
Implantable Lead Implant Date: 20180730
Implantable Lead Location: 753859
Implantable Lead Location: 753860
Implantable Lead Model: 5076
Implantable Lead Model: 5076
Implantable Pulse Generator Implant Date: 20180730
Lead Channel Impedance Value: 304 Ohm
Lead Channel Impedance Value: 418 Ohm
Lead Channel Impedance Value: 437 Ohm
Lead Channel Impedance Value: 475 Ohm
Lead Channel Pacing Threshold Amplitude: 0.5 V
Lead Channel Pacing Threshold Amplitude: 0.625 V
Lead Channel Pacing Threshold Pulse Width: 0.4 ms
Lead Channel Pacing Threshold Pulse Width: 0.4 ms
Lead Channel Sensing Intrinsic Amplitude: 1 mV
Lead Channel Sensing Intrinsic Amplitude: 1 mV
Lead Channel Sensing Intrinsic Amplitude: 20 mV
Lead Channel Sensing Intrinsic Amplitude: 20 mV
Lead Channel Setting Pacing Amplitude: 1.5 V
Lead Channel Setting Pacing Amplitude: 2.5 V
Lead Channel Setting Pacing Pulse Width: 0.4 ms
Lead Channel Setting Sensing Sensitivity: 4 mV

## 2021-09-14 NOTE — Progress Notes (Signed)
Remote pacemaker transmission.   

## 2021-10-07 IMAGING — CR DG SHOULDER 2+V*L*
3 series · 3 of 3 positions shown · non-contrast
Comparison: None.

CLINICAL DATA: Left shoulder pain without known injury.

EXAM:
LEFT SHOULDER - 2+ VIEW

[w shoulder ap internal left]
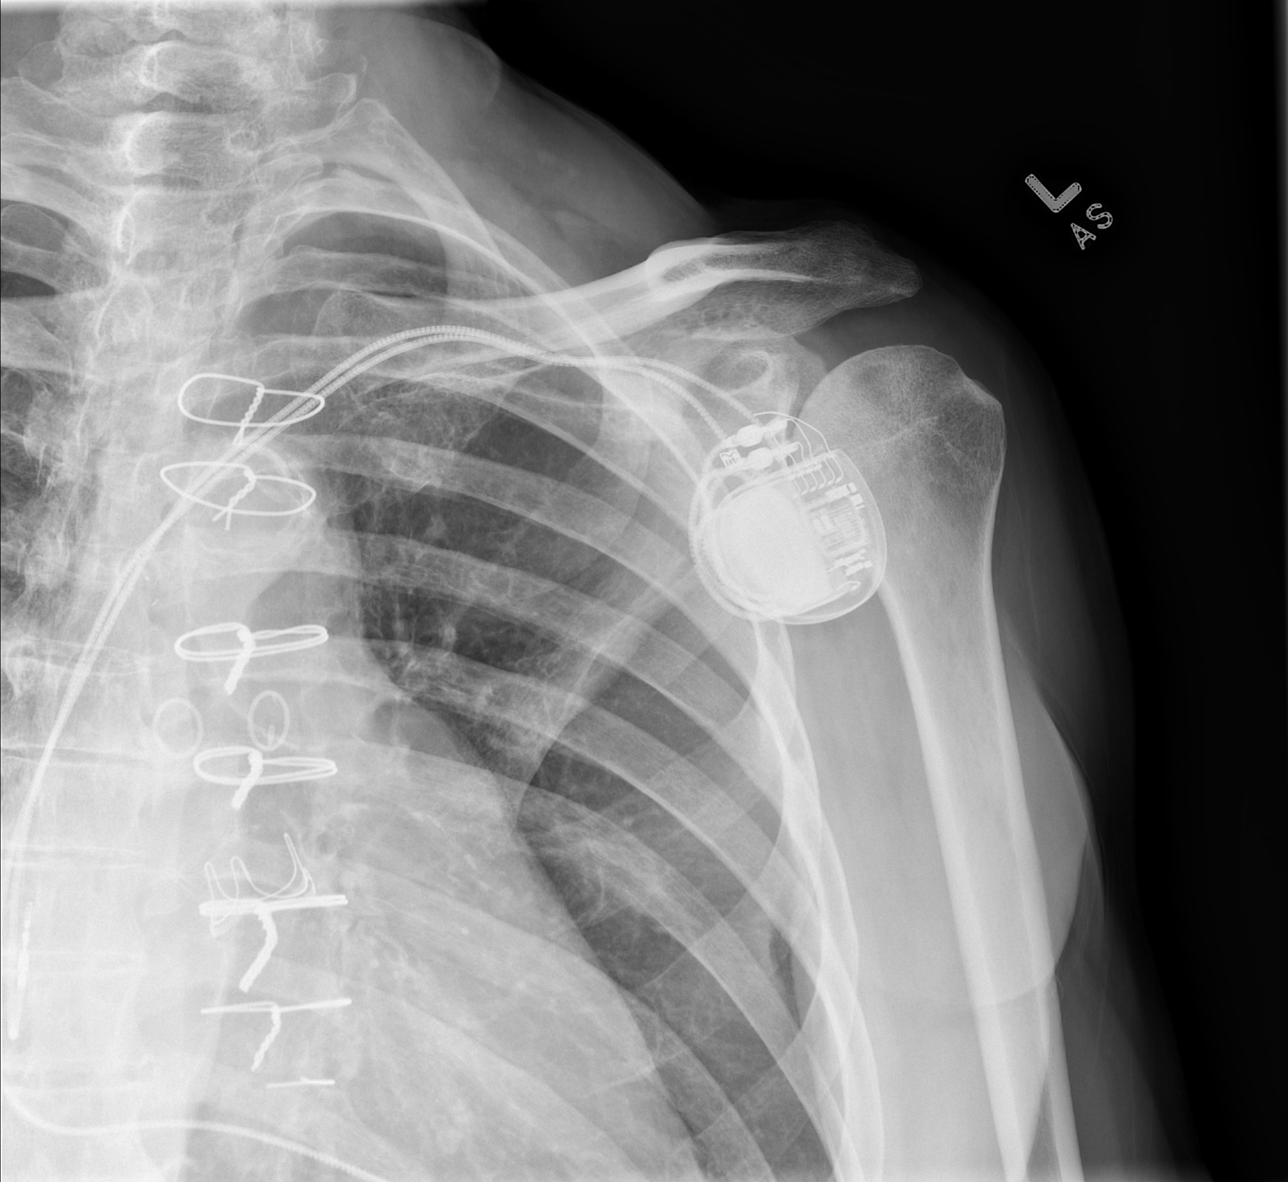

[w shoulder y view left]
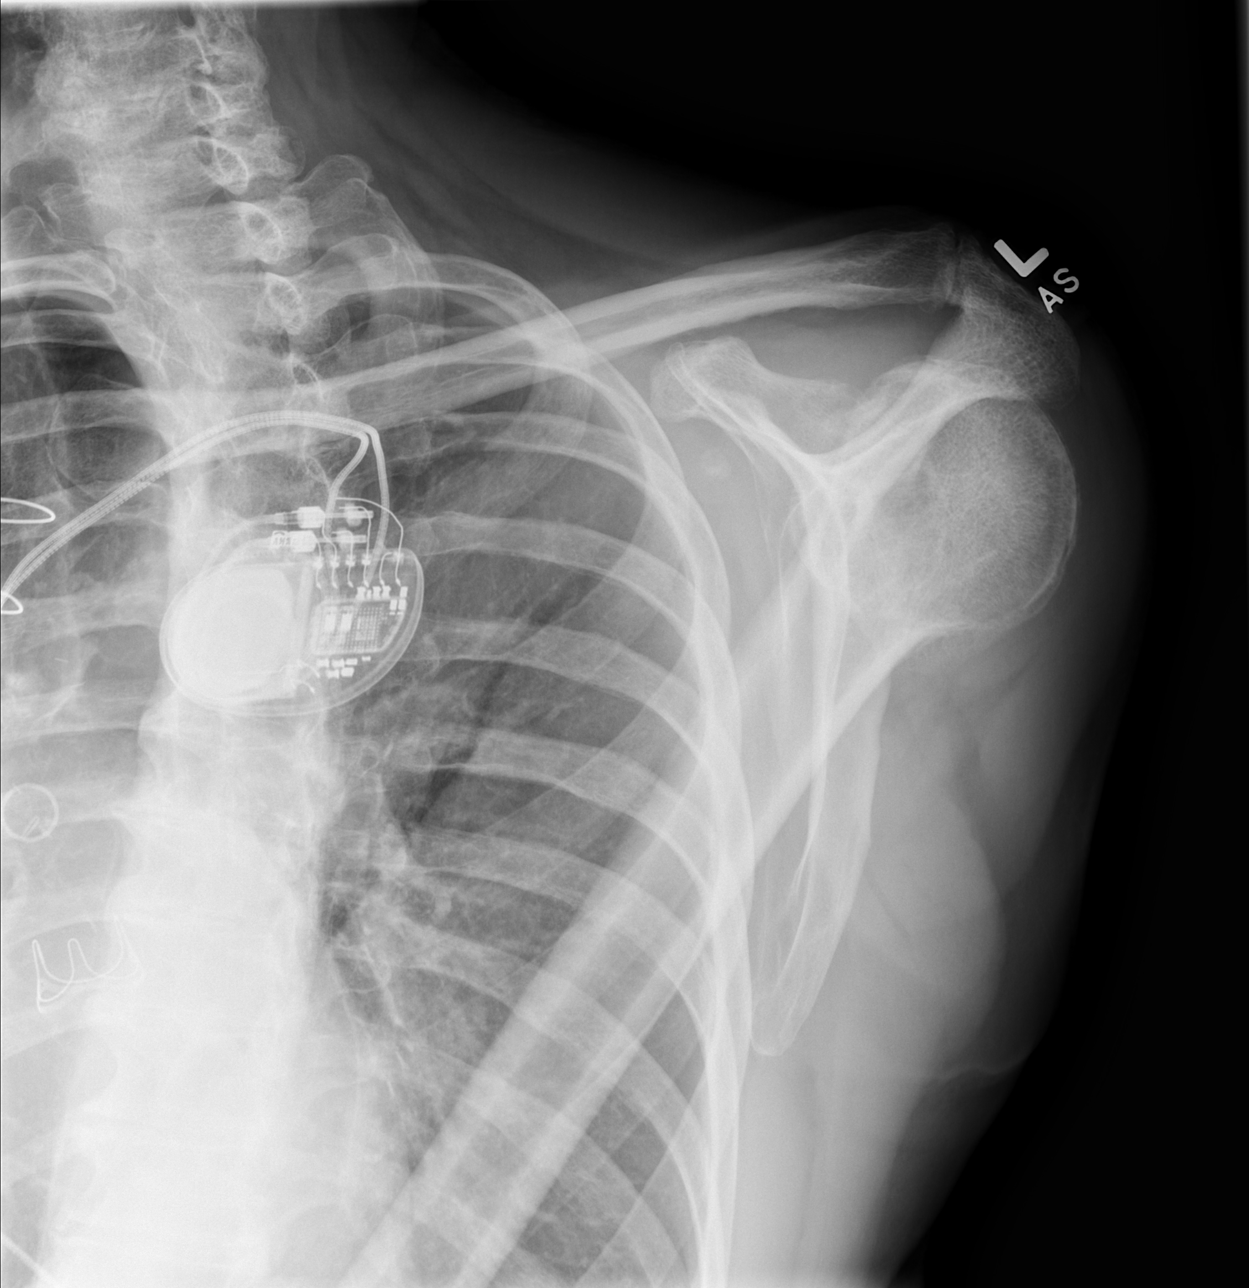

[w shoulder axillary left *]
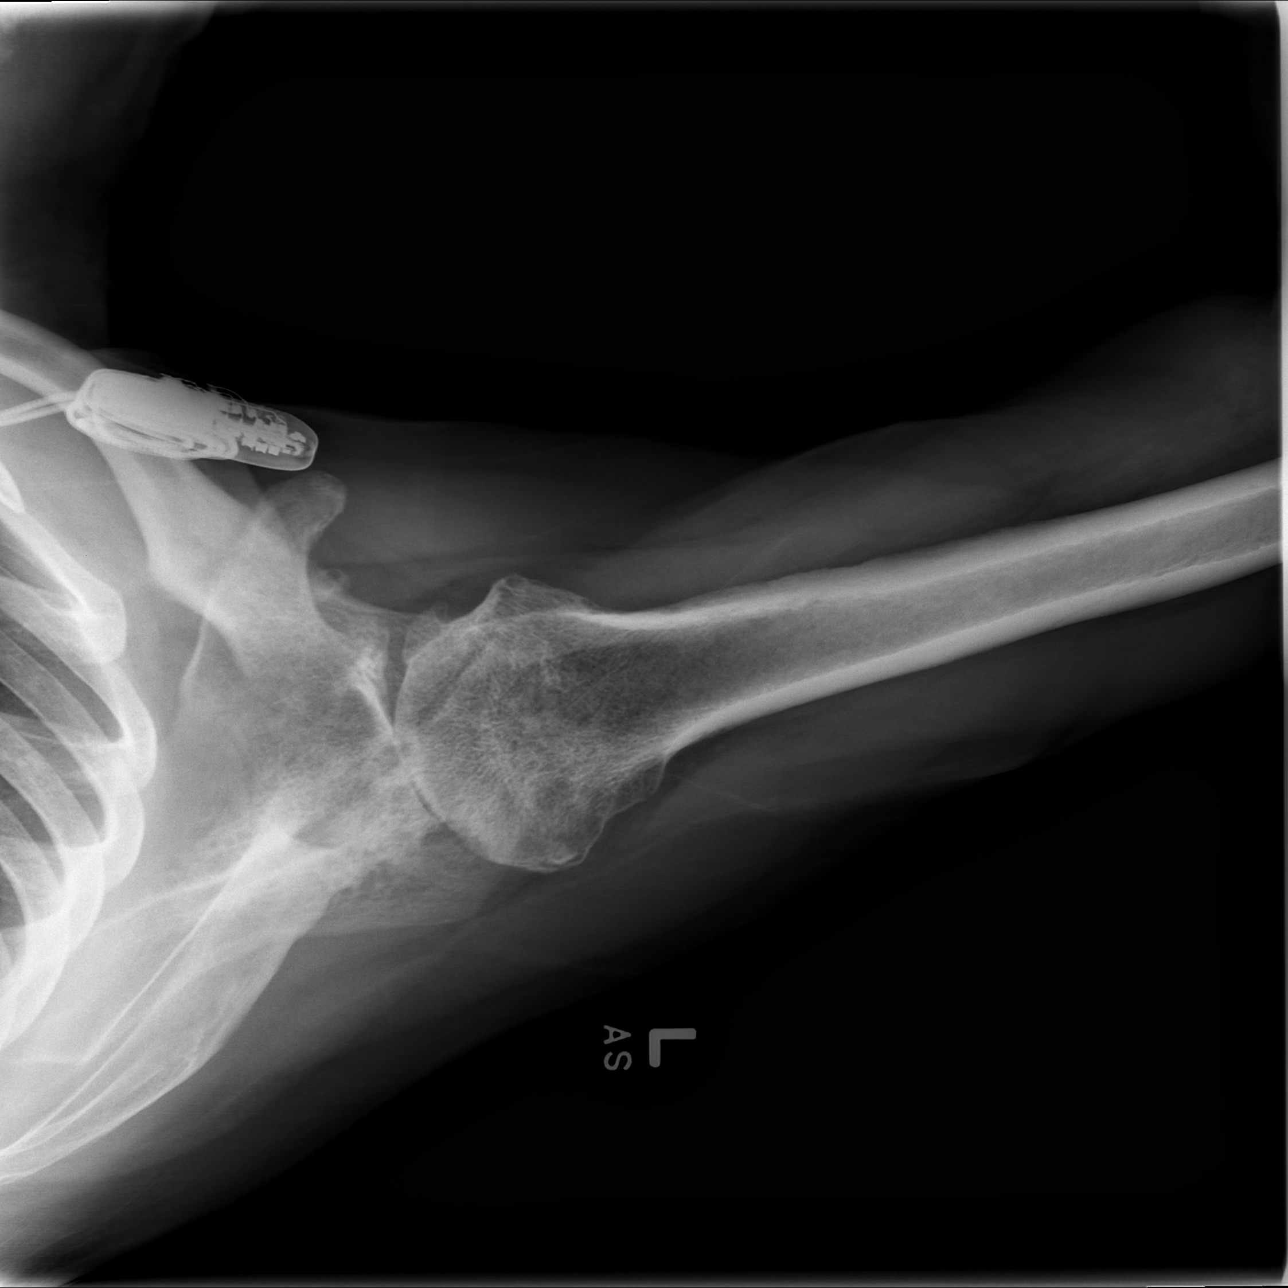

[3 of 3 positions shown; findings below may reference images not displayed]

FINDINGS: There is no evidence of fracture or dislocation. Severe degenerative
changes seen involving the left glenohumeral joint. Soft tissues are
unremarkable.
IMPRESSION: Severe degenerative joint disease of the left glenohumeral joint. No
acute abnormality seen in the left shoulder.

## 2021-12-01 ENCOUNTER — Other Ambulatory Visit: Payer: Self-pay | Admitting: Thoracic Surgery (Cardiothoracic Vascular Surgery)

## 2021-12-01 DIAGNOSIS — I7121 Aneurysm of the ascending aorta, without rupture: Secondary | ICD-10-CM

## 2021-12-07 ENCOUNTER — Ambulatory Visit (INDEPENDENT_AMBULATORY_CARE_PROVIDER_SITE_OTHER): Payer: Medicare Other

## 2021-12-07 DIAGNOSIS — I442 Atrioventricular block, complete: Secondary | ICD-10-CM | POA: Diagnosis not present

## 2021-12-07 LAB — CUP PACEART REMOTE DEVICE CHECK
Battery Remaining Longevity: 65 mo
Battery Voltage: 2.96 V
Brady Statistic AP VP Percent: 45.18 %
Brady Statistic AP VS Percent: 0 %
Brady Statistic AS VP Percent: 54.72 %
Brady Statistic AS VS Percent: 0.1 %
Brady Statistic RA Percent Paced: 44.67 %
Brady Statistic RV Percent Paced: 99.9 %
Date Time Interrogation Session: 20230810010647
Implantable Lead Implant Date: 20180730
Implantable Lead Implant Date: 20180730
Implantable Lead Location: 753859
Implantable Lead Location: 753860
Implantable Lead Model: 5076
Implantable Lead Model: 5076
Implantable Pulse Generator Implant Date: 20180730
Lead Channel Impedance Value: 304 Ohm
Lead Channel Impedance Value: 437 Ohm
Lead Channel Impedance Value: 437 Ohm
Lead Channel Impedance Value: 494 Ohm
Lead Channel Pacing Threshold Amplitude: 0.5 V
Lead Channel Pacing Threshold Amplitude: 0.5 V
Lead Channel Pacing Threshold Pulse Width: 0.4 ms
Lead Channel Pacing Threshold Pulse Width: 0.4 ms
Lead Channel Sensing Intrinsic Amplitude: 1.125 mV
Lead Channel Sensing Intrinsic Amplitude: 1.125 mV
Lead Channel Sensing Intrinsic Amplitude: 20 mV
Lead Channel Sensing Intrinsic Amplitude: 20 mV
Lead Channel Setting Pacing Amplitude: 1.5 V
Lead Channel Setting Pacing Amplitude: 2.5 V
Lead Channel Setting Pacing Pulse Width: 0.4 ms
Lead Channel Setting Sensing Sensitivity: 4 mV

## 2022-01-03 NOTE — Progress Notes (Signed)
Remote pacemaker transmission.   

## 2022-01-04 ENCOUNTER — Telehealth: Payer: Self-pay

## 2022-01-04 NOTE — Telephone Encounter (Signed)
LMOVM for patient to send manual transmission with home remote monitor. I also told him that the nurse will give him a call back when she review transmission.

## 2022-01-04 NOTE — Telephone Encounter (Signed)
[facility] [place] Alert Received.   Alert remote reviewed. Normal device function.   Alert for AF greater than 6 hours and ongoing, AF burden is 0.9%, good ventricular rate control, ? [place].  Sent to triage. Next remote 03/08/2022.   NO OAC Noted in MAR.

## 2022-01-05 NOTE — Telephone Encounter (Signed)
Remote transmission received. Called patient to advise AF/AFL and referral to AF clinic to discuss [place]. Patient is agreeable and denies any symptoms. Advised patient I will send info and someone will call with apt. Pt appreciative of call.

## 2022-01-11 ENCOUNTER — Ambulatory Visit
Admission: RE | Admit: 2022-01-11 | Discharge: 2022-01-11 | Disposition: A | Discharge status: Home / Self Care | Payer: Medicare Other | Source: Ambulatory Visit | Attending: Thoracic Surgery (Cardiothoracic Vascular Surgery) | Admitting: Thoracic Surgery (Cardiothoracic Vascular Surgery)

## 2022-01-11 ENCOUNTER — Encounter (HOSPITAL_COMMUNITY): Payer: Self-pay | Admitting: Nurse Practitioner

## 2022-01-11 ENCOUNTER — Ambulatory Visit (HOSPITAL_COMMUNITY)
Admission: RE | Admit: 2022-01-11 | Discharge: 2022-01-11 | Disposition: A | Discharge status: Home / Self Care | Payer: Medicare Other | Source: Ambulatory Visit | Attending: Nurse Practitioner | Admitting: Nurse Practitioner

## 2022-01-11 VITALS — BP 148/68 | HR 62 | Ht 68.0 in | Wt 177.0 lb

## 2022-01-11 DIAGNOSIS — Z950 Presence of cardiac pacemaker: Secondary | ICD-10-CM | POA: Insufficient documentation

## 2022-01-11 DIAGNOSIS — Z953 Presence of xenogenic heart valve: Secondary | ICD-10-CM | POA: Insufficient documentation

## 2022-01-11 DIAGNOSIS — I4891 Unspecified atrial fibrillation: Secondary | ICD-10-CM | POA: Diagnosis present

## 2022-01-11 DIAGNOSIS — Z79899 Other long term (current) drug therapy: Secondary | ICD-10-CM | POA: Diagnosis not present

## 2022-01-11 DIAGNOSIS — I10 Essential (primary) hypertension: Secondary | ICD-10-CM | POA: Insufficient documentation

## 2022-01-11 DIAGNOSIS — D6869 Other thrombophilia: Secondary | ICD-10-CM | POA: Diagnosis not present

## 2022-01-11 DIAGNOSIS — I7121 Aneurysm of the ascending aorta, without rupture: Secondary | ICD-10-CM

## 2022-01-11 MED ORDER — APIXABAN 5 MG PO TABS: 5.0000 mg | ORAL_TABLET | Freq: Two times a day (BID) | ORAL | 3 refills | Status: DC

## 2022-01-11 MED ORDER — CARVEDILOL 6.25 MG PO TABS: 9.3750 mg | ORAL_TABLET | Freq: Two times a day (BID) | ORAL | 1 refills | Status: DC

## 2022-01-11 NOTE — Patient Instructions (Signed)
STOP aspirin  STOP tumeric  STOP ibuprofen   START Eliquis '5mg'$  twice a day  INCREASE coreg to 1 and 1/2 tablets twice a day (9.'375mg'$  twice a day)

## 2022-01-11 NOTE — Progress Notes (Addendum)
Primary Care Physician: Crist Infante, MD Referring Physician: Device clinic  Cardiologist: Dr. Acie Fredrickson  EP: Dr. Desmond Lope Barrett Keetch is a 81 y.o. male with a h/o HTN, s/p  mechanical AV replacement, Bjork Shiley valve in 1982  on coumadin at that time.  In 2018, he had redo AVR with biologic Bentall with a bovine pericardial valve  repair of proximal ascending aorta and coronary bypass grafting x2 in 2018. He received a PPM at  time of surgery.  He also  came off coumadin, went on ASA then.   He is now in the afib clinic to discuss starting anticoagulation for new onset afib, notified for ongoing afib episode by the device clinic.    He was contacted by the device clinic 9/7 that he has had ongoing afib  at that time with good ventricular rate control. Pt is unaware. He also had a CT of the chest today for f/u with Dr. Roxan Hockey on upcoming Tuesday appointment.This looks stable.  He states he is active, no sleep apnea, he drinks 2 gin and tonics a day. Ekg shows v paced at 62 bpm. He did not have any bleeding issues with prior  coumadin use. .  Today, he denies symptoms of palpitations, chest pain, shortness of breath, orthopnea, PND, lower extremity edema, dizziness, presyncope, syncope, or neurologic sequela. The patient is tolerating medications without difficulties and is otherwise without complaint today.   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, has slight loss of vision   S/P aortic valve replacement with metallic valve 57/47/3403   Due to congenital aortic stenosis // [name]  mechanical valve done in 1982 // Echo 09/10/16: Mild focal basal septal hypertrophy, [name], [name], [name], mod aortic root enlargement, aortic root 36 mm, ascending aorta 49 mm, [name], [name]   Past Surgical History:  Procedure Laterality Date   Aortic valve  1982   prosthetic  val ve   AORTIC VALVE REPLACEMENT N/A 11/21/2016   Procedure: REDO AORTIC VALVE REPLACEMENT (AVR);  Surgeon: Melrose Nakayama, MD;  Location: [facility];  Service: Open Heart Surgery;  Laterality: N/A;   CARDIAC CATHETERIZATION     CORONARY ARTERY BYPASS GRAFT N/A 11/21/2016   Procedure: CORONARY ARTERY BYPASS GRAFTING (CABG) x 2, using right leg greater saphenous vein harvested endoscopically;  Surgeon: Melrose Nakayama, MD;  Location: [facility];  Service: Open Heart Surgery;  Laterality: N/A;   cyst removed  1976   right side of neck   PACEMAKER IMPLANT N/A 11/26/2016   Procedure: Pacemaker Implant;  Surgeon: Deboraha Sprang, MD;  Location: [facility];  Service: Cardiovascular;  Laterality: N/A;   TEE WITHOUT CARDIOVERSION N/A 11/21/2016   Procedure: TRANSESOPHAGEAL ECHOCARDIOGRAM (TEE);  Surgeon: Melrose Nakayama, MD;  Location: [facility];  Service: Open Heart Surgery;  Laterality: N/A;   THORACIC AORTIC ANEURYSM REPAIR N/A 11/21/2016   Procedure: THORACIC ASCENDING ANEURYSM REPAIR (AAA);  Surgeon: Modesto Charon  C, MD;  Location: [facility];  Service: Open Heart Surgery;  Laterality: N/A;   TONSILLECTOMY      Current Outpatient Medications  Medication Sig Dispense Refill   allopurinol (ZYLOPRIM) 300 MG tablet Take 300 mg by mouth daily at 12 noon.      aspirin EC 81 MG EC tablet Take 1 tablet (81 mg total) by mouth daily.     atorvastatin (LIPITOR) 20 MG tablet Take 10 mg daily by mouth.     carvedilol (COREG) 6.25 MG tablet TAKE 1 TABLET BY MOUTH  TWICE DAILY WITH A MEAL 180 tablet 3   cholecalciferol (VITAMIN D) 1000 units tablet Take 5,000 Units by  mouth. Wednesday AND SATURDAY     Coenzyme Q10 50 MG CAPS Take 100 mg by mouth daily with breakfast.     Cyanocobalamin (VITAMIN B-12) 5000 MCG SUBL Place 5,000 mcg under the tongue 2 (two) times a week. Wednesday & Saturday.     ezetimibe (ZETIA) 10 MG tablet Take 5 mg by mouth daily. In the morning.     fish oil-omega-3 fatty acids 1000 MG capsule Take 1 g by mouth 2 (two) times daily.      ibuprofen (ADVIL,MOTRIN) 200 MG tablet Take 200 mg by mouth as needed.     Magnesium 500 MG CAPS Take 500 mg by mouth every evening.      telmisartan (MICARDIS) 80 MG tablet Take 80 mg by mouth daily. In the evening  11   No current facility-administered medications for this encounter.    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, clinical   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   Gym 1x wk   Yoga 1 x wk   Social Determinants of Radio broadcast assistant Strain: Not on file  Food Insecurity: Not on file  Transportation Needs: Not on file  Physical Activity: Not on file  Stress: Not on file  Social Connections: Not on file  Intimate Partner Violence: Not on file    Family History  Problem Relation Age of Onset   Hyperlipidemia Mother    Colon cancer Mother    Emphysema Father    Other Father        respiratory failure   Diabetes Maternal Grandfather    Colon polyps Neg Hx    Kidney disease Neg Hx     Esophageal cancer Neg Hx    Gallbladder disease Neg Hx     ROS- All systems are reviewed and negative except as per the HPI above  Physical Exam: There were no vitals filed for this visit. Wt Readings from Last 3 Encounters:  06/26/21 79.7 kg  04/17/21 78.7 kg  01/10/21 74.8 kg    Labs: Lab Results  Component Value  Date   NA 135 11/28/2016   K 4.8 11/28/2016   CL 103 11/28/2016   CO2 26 11/28/2016   GLUCOSE 116 (H) 11/28/2016   BUN 21 (H) 11/28/2016   CREATININE 1.13 11/28/2016   CALCIUM 8.9 11/28/2016   MG 2.2 11/22/2016   Lab Results  Component Value Date   INR 1.31 11/30/2016   No results found for: "CHOL", "HDL", "LDLCALC", "TRIG"   GEN- The patient is well appearing, alert and oriented x 3 today.   Head- normocephalic, atraumatic Eyes-  Sclera clear, conjunctiva pink Ears- hearing intact Oropharynx- clear Neck- supple, no JVP Lymph- no cervical lymphadenopathy Lungs- Clear to ausculation bilaterally, normal work of breathing Heart- irregular rate and rhythm, no murmurs, rubs or gallops, PMI not laterally displaced GI- soft, NT, ND, + BS Extremities- no clubbing, cyanosis, or edema MS- no significant deformity or atrophy Skin- no rash or lesion Psych- euthymic mood, full affect Neuro- strength and sensation are intact  EKG-Vent. rate 62 BPM PR interval * ms QRS duration 192 ms QT/QTcB 490/497 ms P-R-T axes * -72 89 Ventricular-paced rhythm Abnormal ECG When compared with ECG of 27-Nov-2016 07:17, PREVIOUS ECG IS PRESENT    Assessment and Plan:  1. New onset Afib/AFL  Pt is asymptomatic General education re afib and  triggers discussed Encouraged alcohol cessation for its known ability to trigger afib  Will  increase carvedilol to 6.25 mg bid to 1 1/2 bid to see if this may help to restore SR  2. CHA2DS2VASc  score of at lest 4 Discussed need to start anticoagulation to minimize stroke risk I would recommend eliquis 5 mg bid  Risk vrs benefit  of drug discussed   30 day free card given  Stop asa  Use tylenol for pain and would minimize or stop  tumeric use to lessen chance of bleeding  Bleeding precautions discussed   If afib persists, discussed possible need for cardioversion after 21 days on drug  I will see back in 2 weeks   Butch Penny C. Timmothy Baranowski, [facility] [address] [place], [place] [address] [phone]

## 2022-01-16 ENCOUNTER — Encounter: Payer: Self-pay | Admitting: Thoracic Surgery (Cardiothoracic Vascular Surgery)

## 2022-01-16 ENCOUNTER — Ambulatory Visit: Payer: Medicare Other | Admitting: Thoracic Surgery (Cardiothoracic Vascular Surgery)

## 2022-01-16 VITALS — BP 158/68 | HR 83 | Resp 18 | Ht 68.0 in | Wt 171.0 lb

## 2022-01-16 DIAGNOSIS — I7121 Aneurysm of the ascending aorta, without rupture: Secondary | ICD-10-CM

## 2022-01-16 NOTE — Progress Notes (Signed)
[address]       [place] [address]             [phone]     HPI: [name] returns for a scheduled follow-up  Dakota Martin is an 81 year old man with a history of aortic stenosis, AVR with a Bjork Shiley valve, ascending aneurysm, CAD, AVR and aneurysm repair with CABG x2 in 2018, hypertension, hyperlipidemia, aortic atherosclerosis, and now atrial fibrillation.  He had a Bjork Shiley valve placed in 1982.  He developed an enlarging aortic root.  I did a biologic percentile with a bovine pericardial valve in 2018.  I have continue to follow him for mild dilatation of the distal ascending aorta.  About a month ago he was found to be in atrial fibrillation when his pacemaker was interrogated.  He saw Roderic Palau at the atrial fibrillation clinic.  He was started on Eliquis.  His rate was controlled.  He was told to stop aspirin, fish oil, IV Profen, and turmeric.  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      apixaban (ELIQUIS) 5 MG  TABS tablet Take 1 tablet (5 mg total) by mouth 2 (two) times daily. 60 tablet 3   atorvastatin (LIPITOR) 20 MG tablet Take 10 mg daily by mouth.     carvedilol (COREG) 6.25 MG tablet Take 1.5 tablets (9.375 mg total) by mouth 2 (two) times daily with a meal. 270 tablet 1   cholecalciferol (VITAMIN D) 1000 units tablet Take 5,000 Units by mouth. Wednesday AND SATURDAY     Coenzyme Q10 50 MG CAPS Take 100 mg by mouth daily with breakfast.     Cyanocobalamin (VITAMIN B-12) 5000 MCG SUBL Place 5,000 mcg under the tongue 2 (two) times a week. Wednesday & Saturday.     ezetimibe (ZETIA) 10 MG tablet Take 5 mg by mouth daily. In the morning.     fish oil-omega-3 fatty acids 1000 MG capsule Take 1 g by mouth 2 (two) times daily.      Magnesium 500 MG CAPS Take 500 mg by mouth every evening.      sildenafil (VIAGRA) 100 MG tablet 1/2 to 1  tab 30 min. to 4 hours prior to sexual activity Orally     telmisartan (MICARDIS) 80 MG tablet Take 80 mg by mouth daily. In the evening  11   No current facility-administered medications for this visit.    Physical Exam  BP (!) 158/68 (BP Location: Left Arm, Patient Position: Sitting)   Pulse 83   Resp 18   Ht _0  (1.727 m)   Wt 171 lb (77.6 kg)   SpO2 99% Comment: RA  BMI 26.00 kg/m  Well-appearing 81 year old man in no acute distress Alert and oriented x3 with no focal deficits Lungs clear with equal breath sounds bilaterally Cardiac regular rate and rhythm normal S1 and S2 with faint systolic murmur No peripheral edema  Diagnostic Tests: CT CHEST WITHOUT CONTRAST   TECHNIQUE: Multidetector CT imaging of the chest was performed following the standard protocol without IV contrast.   RADIATION DOSE REDUCTION: This exam was performed according to the departmental dose-optimization program which includes automated exposure control, adjustment of the mA and/or kV according to patient size and/or use of iterative reconstruction technique.    COMPARISON:  CT scan 12/23/2020   FINDINGS: Cardiovascular: The heart is normal in size. No pericardial effusion. Stable pacer wires. Stable coronary artery calcifications. Stable surgical changes from aortic valve replacement surgery and ascending thoracic aortic graft. Stable mild aneurysmal dilatation of the upper ascending thoracic aorta/proximal aortic arch just past the graft. This measures approximately 3.9 cm and is stable. Stable advanced atherosclerotic calcifications involving the aortic arch and descending thoracic aorta.   Mediastinum/Nodes: Stable scattered mediastinal and hilar lymph nodes but no mass or overt adenopathy. The esophagus is grossly normal.   Lungs/Pleura: No acute pulmonary findings. No worrisome pulmonary lesions. Streaky basilar scarring changes and subpleural atelectasis.   Upper Abdomen: No significant upper abdominal findings. Stable benign hepatic cysts and scattered calcified splenic granulomas. No adrenal gland lesions. No upper abdominal adenopathy. Stable vascular disease.   Musculoskeletal: No chest wall mass, supraclavicular or axillary adenopathy. The bony thorax is intact.   IMPRESSION: 1. Stable surgical changes from aortic valve replacement surgery and ascending thoracic aortic graft. 2. Stable mild aneurysmal dilatation of the upper ascending thoracic aorta/proximal aortic arch just past the graft. 3. Stable advanced atherosclerotic calcifications involving the aortic arch and descending thoracic aorta. 4. No acute pulmonary findings or worrisome pulmonary lesions.   Aortic Atherosclerosis (ICD10-I70.0).     Electronically Signed   By: Marijo Sanes M.D.   On: 01/11/2022 12:15 I personally reviewed the CT images.  No change in the distal ascending aortic ectasia.  Impression: Dakota Martin is an 81 year old man with a history of aortic stenosis, AVR with a Bjork Shiley valve, ascending aneurysm, CAD, AVR and  aneurysm repair with CABG x2 in 2018, hypertension, hyperlipidemia, aortic atherosclerosis, and now atrial fibrillation.  Status post biologic Bentall in 2018-echo in January showed good function of the prosthetic valve with no stenosis.  Distal ascending aorta unchanged in size at 3.9 cm.  We will continue to follow annually.  New onset atrial fibrillation-noted on pacemaker interrogation.  He does not have symptoms.  Started on Eliquis.  We will follow-up with atrial fibrillation clinic.  He has a lot of questions about the medications and why use Eliquis instead of Coumadin.  Typically Eliquis is easier to use, but he was on Coumadin for years without any issues.  He will address those questions with the A-fib clinic.  Hypertension-blood pressure elevated at 158/68 today.  Typically runs about 970 systolic at home.  We will continue to monitor at home.  Plan: Return in 1 year with CT chest to follow-up ascending ectasia  Melrose Nakayama, MD [facility] [phone]

## 2022-01-22 ENCOUNTER — Encounter (HOSPITAL_COMMUNITY): Payer: Self-pay

## 2022-01-24 ENCOUNTER — Ambulatory Visit (HOSPITAL_COMMUNITY)
Admission: RE | Admit: 2022-01-24 | Discharge: 2022-01-24 | Disposition: A | Discharge status: Home / Self Care | Payer: Medicare Other | Source: Ambulatory Visit | Attending: Nurse Practitioner | Admitting: Nurse Practitioner

## 2022-01-24 ENCOUNTER — Encounter (HOSPITAL_COMMUNITY): Payer: Self-pay | Admitting: Nurse Practitioner

## 2022-01-24 VITALS — BP 170/68 | HR 61 | Ht 68.0 in | Wt 176.8 lb

## 2022-01-24 DIAGNOSIS — I4891 Unspecified atrial fibrillation: Secondary | ICD-10-CM | POA: Insufficient documentation

## 2022-01-24 DIAGNOSIS — D6869 Other thrombophilia: Secondary | ICD-10-CM

## 2022-01-24 DIAGNOSIS — Z7901 Long term (current) use of anticoagulants: Secondary | ICD-10-CM | POA: Diagnosis not present

## 2022-01-24 DIAGNOSIS — I10 Essential (primary) hypertension: Secondary | ICD-10-CM | POA: Diagnosis not present

## 2022-01-24 LAB — COMPREHENSIVE METABOLIC PANEL
ALT: 36 U/L (ref 0–44)
AST: 30 U/L (ref 15–41)
Albumin: 4.2 g/dL (ref 3.5–5.0)
Alkaline Phosphatase: 68 U/L (ref 38–126)
Anion gap: 11 (ref 5–15)
BUN: 21 mg/dL (ref 8–23)
CO2: 24 mmol/L (ref 22–32)
Calcium: 9.6 mg/dL (ref 8.9–10.3)
Chloride: 103 mmol/L (ref 98–111)
Creatinine, Ser: 1.2 mg/dL (ref 0.61–1.24)
GFR, Estimated: >60 mL/min (ref >60)
Glucose, Bld: 109 mg/dL — ABNORMAL HIGH (ref 70–99)
Potassium: 4.6 mmol/L (ref 3.5–5.1)
Sodium: 138 mmol/L (ref 135–145)
Total Bilirubin: 1.1 mg/dL (ref 0.3–1.2)
Total Protein: 7.1 g/dL (ref 6.5–8.1)

## 2022-01-24 LAB — CBC
HCT: 41.8 % (ref 39.0–52.0)
Hemoglobin: 13.5 g/dL (ref 13.0–17.0)
MCH: 32.5 pg (ref 26.0–34.0)
MCHC: 32.3 g/dL (ref 30.0–36.0)
MCV: 100.7 fL — ABNORMAL HIGH (ref 80.0–100.0)
Platelets: 138 10*3/uL — ABNORMAL LOW (ref 150–400)
RBC: 4.15 MIL/uL — ABNORMAL LOW (ref 4.22–5.81)
RDW: 14.4 % (ref 11.5–15.5)
WBC: 7.9 10*3/uL (ref 4.0–10.5)
nRBC: 0 % (ref 0.0–0.2)

## 2022-01-24 LAB — TSH: TSH: 3.058 u[IU]/mL (ref 0.350–4.500)

## 2022-01-24 MED ORDER — APIXABAN 5 MG PO TABS: 5.0000 mg | ORAL_TABLET | Freq: Two times a day (BID) | ORAL | 1 refills | Status: DC

## 2022-01-24 NOTE — Progress Notes (Signed)
Primary Care Physician: Dakota Infante, MD Referring Physician: Device clinic  Cardiologist: Dr. Acie Martin  EP: Dr. Desmond Lope Barrett Martin is a 81 y.o. male with a h/o HTN, s/p  mechanical AV replacement, [name] valve in 1982  on coumadin at that time.  In 2018, he had redo AVR with biologic Bentall with a bovine pericardial valve  repair of proximal ascending aorta and coronary bypass grafting x2 in 2018. He received a PPM at  time of surgery.  He also  came off coumadin, went on ASA then.   He is now in the afib clinic to discuss starting anticoagulation for new onset afib, notified for ongoing afib episode by the device clinic.    He was contacted by the device clinic 9/7 that he has had ongoing afib  at that time with good ventricular rate control. Pt is unaware. He also had a CT of the chest today for f/u with Dr. Roxan Martin on upcoming Tuesday appointment.This looks stable.  He states he is active, no sleep apnea, he drinks 2 gin and tonics a day. Ekg shows v paced at 62 bpm. He did not have any bleeding issues with prior  coumadin use.   He is now in the afib clinic for f/u of  new onset afib.  He is still asymptomatic. He has many questions for met  that are answered today. He is not having any issues with eliquis. He sent a report to the device clinic this am and wants me to review.  It reveals persistent afib as the ekg does today as well. We discussed cardioversion and he  wants to proceed.   Today, he denies symptoms of palpitations, chest pain, shortness of breath, orthopnea, PND, lower extremity edema, dizziness, presyncope, syncope, or neurologic sequela. The patient is tolerating medications without difficulties and is otherwise without complaint today.   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, has slight loss of vision   S/P aortic valve replacement with metallic valve 45/80/9983   Due to congenital aortic stenosis // Dakota Martin mechanical valve done in 1982 // Echo 09/10/16: Mild focal basal septal hypertrophy, [name], [name], [name], mod aortic root enlargement, aortic root 36 mm, ascending aorta 49 mm, [name], [name]   Past Surgical History:  Procedure Laterality Date   Aortic valve  1982   prosthetic  val ve   AORTIC VALVE REPLACEMENT N/A 11/21/2016   Procedure: REDO AORTIC VALVE REPLACEMENT (AVR);  Surgeon: Dakota Nakayama, MD;  Location: [facility];  Service: Open Heart Surgery;  Laterality: N/A;   CARDIAC CATHETERIZATION     CORONARY ARTERY BYPASS GRAFT N/A 11/21/2016   Procedure: CORONARY ARTERY BYPASS GRAFTING (CABG) x 2, using right leg greater saphenous vein harvested endoscopically;  Surgeon: Dakota Nakayama, MD;  Location: [facility];  Service: Open Heart Surgery;  Laterality: N/A;   cyst removed  1976   right side of neck   PACEMAKER  IMPLANT N/A 11/26/2016   Procedure: Pacemaker Implant;  Surgeon: Dakota Sprang, MD;  Location: [facility];  Service: Cardiovascular;  Laterality: N/A;   TEE WITHOUT CARDIOVERSION N/A 11/21/2016   Procedure: TRANSESOPHAGEAL ECHOCARDIOGRAM (TEE);  Surgeon: Dakota Nakayama, MD;  Location: [facility];  Service: Open Heart Surgery;  Laterality: N/A;   THORACIC AORTIC ANEURYSM REPAIR N/A 11/21/2016   Procedure: THORACIC ASCENDING ANEURYSM REPAIR (AAA);  Surgeon: Dakota Nakayama, MD;  Location: [facility];  Service: Open Heart Surgery;  Laterality: N/A;   TONSILLECTOMY      Current Outpatient Medications  Medication Sig Dispense Refill   allopurinol (ZYLOPRIM)  300 MG tablet Take 300 mg by mouth daily at 12 noon.      apixaban (ELIQUIS) 5 MG TABS tablet Take 1 tablet (5 mg total) by mouth 2 (two) times daily. 60 tablet 3   atorvastatin (LIPITOR) 20 MG tablet Take 10 mg daily by mouth.     carvedilol (COREG) 6.25 MG tablet Take 1.5 tablets (9.375 mg total) by mouth 2 (two) times daily with a meal. 270 tablet 1   cholecalciferol (VITAMIN D) 1000 units tablet Take 5,000 Units by mouth. Wednesday AND SATURDAY     Coenzyme Q10 50 MG CAPS Take 100 mg by mouth daily with breakfast.     Cyanocobalamin (VITAMIN B-12) 5000 MCG SUBL Place 5,000 mcg under the tongue 2 (two) times a week. Wednesday & Saturday.     ezetimibe (ZETIA) 10 MG tablet Take 5 mg by mouth daily. In the morning.     fish oil-omega-3 fatty acids 1000 MG capsule Take 1 g by mouth 2 (two) times daily.      Magnesium 500 MG CAPS Take 500 mg by mouth every evening.      sildenafil (VIAGRA) 100 MG tablet 1/2 to 1  tab 30 min. to 4 hours prior to sexual activity Orally     telmisartan (MICARDIS) 80 MG tablet Take 80 mg by mouth daily. In the evening  11   No current facility-administered medications for this encounter.    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, clinical   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   Gym 1x wk   Yoga 1 x wk   Social Determinants of Radio broadcast assistant Strain: Not on file   Food Insecurity: Not on file  Transportation Needs: Not on file  Physical Activity: Not on file  Stress: Not on file  Social Connections: Not on file  Intimate Partner Violence: Not on file    Family History  Problem Relation Age of Onset   Hyperlipidemia Mother    Colon cancer Mother    Emphysema Father    Other Father  respiratory failure   Diabetes Maternal Grandfather    Colon polyps Neg Hx    Kidney disease Neg Hx    Esophageal cancer Neg Hx    Gallbladder disease Neg Hx     ROS- All systems are reviewed and negative except as per the HPI above  Physical Exam: Vitals:   01/24/22 1423  Height: _0  (1.727 m)   Wt Readings from Last 3 Encounters:  01/16/22 77.6 kg  01/11/22 80.3 kg  06/26/21 79.7 kg    Labs: Lab Results  Component Value Date   NA 135 11/28/2016   K 4.8 11/28/2016   CL 103 11/28/2016   CO2 26 11/28/2016   GLUCOSE 116 (H) 11/28/2016   BUN 21 (H) 11/28/2016   CREATININE 1.13 11/28/2016   CALCIUM 8.9 11/28/2016   MG 2.2 11/22/2016   Lab Results  Component Value Date   INR 1.31 11/30/2016   No results found for: "CHOL", "HDL", "LDLCALC", "TRIG"   GEN- The patient is well appearing, alert and oriented x 3 today.   Head- normocephalic, atraumatic Eyes-  Sclera clear, conjunctiva pink Ears- hearing intact Oropharynx- clear Neck- supple, no JVP Lymph- no cervical lymphadenopathy Lungs- Clear to ausculation bilaterally, normal work of breathing Heart- irregular rate and rhythm, no murmurs, rubs or gallops, PMI not laterally displaced GI- soft, NT, ND, + BS Extremities- no clubbing, cyanosis, or edema MS- no significant deformity or atrophy Skin- no rash or lesion Psych- euthymic mood, full affect Neuro- strength and sensation are intact  Ekg-Vent. rate 61 BPM PR interval * ms QRS duration 186 ms QT/QTcB 480/483 ms P-R-T axes * -70 93 Ventricular-paced rhythm Abnormal ECG When compared with ECG of 11-Jan-2022  14:54, PREVIOUS ECG IS PRESENT  Device clinic report from 9/27 am-   Assessment and Plan:  1. New onset Afib/AFL Persistent since 9/7  Pt is asymptomatic General education re afib and  triggers discussed We discussed proceeding with cardioversion, risk vrs benefit and he wants to proceed Continue carvedilol to 6.25 mg bid to 1 1/2 bid    2. CHA2DS2VASc  score of at lest 4 He is on eliquis 5 mg bid, no missed doses since starting drug Risk vrs benefit of drug , mechanisms of drug discussed in detail, especially compared to coumadin  Will go ahead and schedule cardioversion after 02/04/20 (pending 10/10) Cmet/cbc/tsh today   Dakota Martin, [facility] [address] [place], [place] [address] [phone]

## 2022-01-24 NOTE — Patient Instructions (Signed)
Cardioversion scheduled for Tuesday, October 10th  - Arrive at the [facility] and go to admitting at [facility] not eat or drink anything after midnight the night prior to your procedure.  - Take all your morning medication (except diabetic medications) with a sip of water prior to arrival.  - You will not be able to drive home after your procedure.  - Do NOT miss any doses of your blood thinner - if you should miss a dose please notify our office immediately.  - If you feel as if you go back into normal rhythm prior to scheduled cardioversion, please notify our office immediately. If your procedure is canceled in the cardioversion suite you will be charged a cancellation fee.

## 2022-01-24 NOTE — H&P (View-Only) (Signed)
Primary Care Physician: Crist Infante, MD Referring Physician: Device clinic  Cardiologist: Dr. Acie Fredrickson  EP: Dr. Desmond Lope Barrett Martin is a 81 y.o. male with a h/o HTN, s/p  mechanical AV replacement, Bjork Shiley valve in 1982  on coumadin at that time.  In 2018, he had redo AVR with biologic Bentall with a bovine pericardial valve  repair of proximal ascending aorta and coronary bypass grafting x2 in 2018. He received a PPM at  time of surgery.  He also  came off coumadin, went on ASA then.   He is now in the afib clinic to discuss starting anticoagulation for new onset afib, notified for ongoing afib episode by the device clinic.    He was contacted by the device clinic 9/7 that he has had ongoing afib  at that time with good ventricular rate control. Pt is unaware. He also had a CT of the chest today for f/u with Dr. Roxan Hockey on upcoming Tuesday appointment.This looks stable.  He states he is active, no sleep apnea, he drinks 2 gin and tonics a day. Ekg shows v paced at 62 bpm. He did not have any bleeding issues with prior  coumadin use.   He is now in the afib clinic for f/u of  new onset afib.  He is still asymptomatic. He has many questions for met  that are answered today. He is not having any issues with eliquis. He sent a report to the device clinic this am and wants me to review.  It reveals persistent afib as the ekg does today as well. We discussed cardioversion and he  wants to proceed.   Today, he denies symptoms of palpitations, chest pain, shortness of breath, orthopnea, PND, lower extremity edema, dizziness, presyncope, syncope, or neurologic sequela. The patient is tolerating medications without difficulties and is otherwise without complaint today.   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, has slight loss of vision   S/P aortic valve replacement with metallic valve 60/01/9322   Due to congenital aortic stenosis // [name] mechanical valve done in 1982 // Echo 09/10/16: Mild focal basal septal hypertrophy, [name], [name], [name], mod aortic root enlargement, aortic root 36 mm, ascending aorta 49 mm, [name], [name]   Past Surgical History:  Procedure Laterality Date   Aortic valve  1982   prosthetic  val ve   AORTIC VALVE REPLACEMENT N/A 11/21/2016   Procedure: REDO AORTIC VALVE REPLACEMENT (AVR);  Surgeon: Melrose Nakayama, MD;  Location: [facility];  Service: Open Heart Surgery;  Laterality: N/A;   CARDIAC CATHETERIZATION     CORONARY ARTERY BYPASS GRAFT N/A 11/21/2016   Procedure: CORONARY ARTERY BYPASS GRAFTING (CABG) x 2, using right leg greater saphenous vein harvested endoscopically;  Surgeon: Melrose Nakayama, MD;  Location: [facility];  Service: Open Heart Surgery;  Laterality: N/A;   cyst removed  1976   right side of neck   PACEMAKER  IMPLANT N/A 11/26/2016   Procedure: Pacemaker Implant;  Surgeon: Deboraha Sprang, MD;  Location: [facility];  Service: Cardiovascular;  Laterality: N/A;   TEE WITHOUT CARDIOVERSION N/A 11/21/2016   Procedure: TRANSESOPHAGEAL ECHOCARDIOGRAM (TEE);  Surgeon: Melrose Nakayama, MD;  Location: [facility];  Service: Open Heart Surgery;  Laterality: N/A;   THORACIC AORTIC ANEURYSM REPAIR N/A 11/21/2016   Procedure: THORACIC ASCENDING ANEURYSM REPAIR (AAA);  Surgeon: Melrose Nakayama, MD;  Location: [facility];  Service: Open Heart Surgery;  Laterality: N/A;   TONSILLECTOMY      Current Outpatient Medications  Medication Sig Dispense Refill   allopurinol (ZYLOPRIM)  300 MG tablet Take 300 mg by mouth daily at 12 noon.      apixaban (ELIQUIS) 5 MG TABS tablet Take 1 tablet (5 mg total) by mouth 2 (two) times daily. 60 tablet 3   atorvastatin (LIPITOR) 20 MG tablet Take 10 mg daily by mouth.     carvedilol (COREG) 6.25 MG tablet Take 1.5 tablets (9.375 mg total) by mouth 2 (two) times daily with a meal. 270 tablet 1   cholecalciferol (VITAMIN D) 1000 units tablet Take 5,000 Units by mouth. Wednesday AND SATURDAY     Coenzyme Q10 50 MG CAPS Take 100 mg by mouth daily with breakfast.     Cyanocobalamin (VITAMIN B-12) 5000 MCG SUBL Place 5,000 mcg under the tongue 2 (two) times a week. Wednesday & Saturday.     ezetimibe (ZETIA) 10 MG tablet Take 5 mg by mouth daily. In the morning.     fish oil-omega-3 fatty acids 1000 MG capsule Take 1 g by mouth 2 (two) times daily.      Magnesium 500 MG CAPS Take 500 mg by mouth every evening.      sildenafil (VIAGRA) 100 MG tablet 1/2 to 1  tab 30 min. to 4 hours prior to sexual activity Orally     telmisartan (MICARDIS) 80 MG tablet Take 80 mg by mouth daily. In the evening  11   No current facility-administered medications for this encounter.    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, clinical   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   Gym 1x wk   Yoga 1 x wk   Social Determinants of Radio broadcast assistant Strain: Not on file   Food Insecurity: Not on file  Transportation Needs: Not on file  Physical Activity: Not on file  Stress: Not on file  Social Connections: Not on file  Intimate Partner Violence: Not on file    Family History  Problem Relation Age of Onset   Hyperlipidemia Mother    Colon cancer Mother    Emphysema Father    Other Father  respiratory failure   Diabetes Maternal Grandfather    Colon polyps Neg Hx    Kidney disease Neg Hx    Esophageal cancer Neg Hx    Gallbladder disease Neg Hx     ROS- All systems are reviewed and negative except as per the HPI above  Physical Exam: Vitals:   01/24/22 1423  Height: _0  (1.727 m)   Wt Readings from Last 3 Encounters:  01/16/22 77.6 kg  01/11/22 80.3 kg  06/26/21 79.7 kg    Labs: Lab Results  Component Value Date   NA 135 11/28/2016   K 4.8 11/28/2016   CL 103 11/28/2016   CO2 26 11/28/2016   GLUCOSE 116 (H) 11/28/2016   BUN 21 (H) 11/28/2016   CREATININE 1.13 11/28/2016   CALCIUM 8.9 11/28/2016   MG 2.2 11/22/2016   Lab Results  Component Value Date   INR 1.31 11/30/2016   No results found for: "CHOL", "HDL", "LDLCALC", "TRIG"   GEN- The patient is well appearing, alert and oriented x 3 today.   Head- normocephalic, atraumatic Eyes-  Sclera clear, conjunctiva pink Ears- hearing intact Oropharynx- clear Neck- supple, no JVP Lymph- no cervical lymphadenopathy Lungs- Clear to ausculation bilaterally, normal work of breathing Heart- irregular rate and rhythm, no murmurs, rubs or gallops, PMI not laterally displaced GI- soft, NT, ND, + BS Extremities- no clubbing, cyanosis, or edema MS- no significant deformity or atrophy Skin- no rash or lesion Psych- euthymic mood, full affect Neuro- strength and sensation are intact  Ekg-Vent. rate 61 BPM PR interval * ms QRS duration 186 ms QT/QTcB 480/483 ms P-R-T axes * -70 93 Ventricular-paced rhythm Abnormal ECG When compared with ECG of 11-Jan-2022  14:54, PREVIOUS ECG IS PRESENT  Device clinic report from 9/27 am-   Assessment and Plan:  1. New onset Afib/AFL Persistent since 9/7  Pt is asymptomatic General education re afib and  triggers discussed We discussed proceeding with cardioversion, risk vrs benefit and he wants to proceed Continue carvedilol to 6.25 mg bid to 1 1/2 bid    2. CHA2DS2VASc  score of at lest 4 He is on eliquis 5 mg bid, no missed doses since starting drug Risk vrs benefit of drug , mechanisms of drug discussed in detail, especially compared to coumadin  Will go ahead and schedule cardioversion after 02/04/20 (pending 10/10) Cmet/cbc/tsh today   Butch Penny C. Annalei Friesz, [facility] [address] [place], [place] [address] [phone]

## 2022-02-06 ENCOUNTER — Ambulatory Visit (HOSPITAL_BASED_OUTPATIENT_CLINIC_OR_DEPARTMENT_OTHER): Payer: Medicare Other | Admitting: Certified Registered Nurse Anesthetist

## 2022-02-06 ENCOUNTER — Ambulatory Visit (HOSPITAL_COMMUNITY): Payer: Medicare Other | Admitting: Certified Registered Nurse Anesthetist

## 2022-02-06 ENCOUNTER — Encounter (HOSPITAL_COMMUNITY): Payer: Self-pay | Admitting: Cardiology

## 2022-02-06 ENCOUNTER — Ambulatory Visit (HOSPITAL_COMMUNITY)
Admission: RE | Admit: 2022-02-06 | Discharge: 2022-02-06 | Disposition: A | Discharge status: Home / Self Care | Payer: Medicare Other | Source: Ambulatory Visit | Attending: Cardiology | Admitting: Cardiology

## 2022-02-06 ENCOUNTER — Other Ambulatory Visit: Payer: Self-pay

## 2022-02-06 ENCOUNTER — Encounter (HOSPITAL_COMMUNITY)
Admission: RE | Disposition: A | Discharge status: Home / Self Care | Payer: Self-pay | Source: Ambulatory Visit | Attending: Cardiology

## 2022-02-06 DIAGNOSIS — Z7901 Long term (current) use of anticoagulants: Secondary | ICD-10-CM | POA: Diagnosis not present

## 2022-02-06 DIAGNOSIS — Z951 Presence of aortocoronary bypass graft: Secondary | ICD-10-CM | POA: Insufficient documentation

## 2022-02-06 DIAGNOSIS — I10 Essential (primary) hypertension: Secondary | ICD-10-CM | POA: Insufficient documentation

## 2022-02-06 DIAGNOSIS — I4891 Unspecified atrial fibrillation: Secondary | ICD-10-CM

## 2022-02-06 DIAGNOSIS — I2510 Atherosclerotic heart disease of native coronary artery without angina pectoris: Secondary | ICD-10-CM | POA: Diagnosis not present

## 2022-02-06 DIAGNOSIS — I4892 Unspecified atrial flutter: Secondary | ICD-10-CM | POA: Insufficient documentation

## 2022-02-06 DIAGNOSIS — M1990 Unspecified osteoarthritis, unspecified site: Secondary | ICD-10-CM | POA: Diagnosis not present

## 2022-02-06 HISTORY — PX: CARDIOVERSION: SHX1299

## 2022-02-06 SURGERY — CARDIOVERSION
Anesthesia: General

## 2022-02-06 MED ORDER — LIDOCAINE 2% (20 MG/ML) 5 ML SYRINGE
INTRAMUSCULAR | Status: DC | PRN
  Administered 2022-02-06: 60 mg via INTRAVENOUS

## 2022-02-06 MED ORDER — PROPOFOL 10 MG/ML IV BOLUS
INTRAVENOUS | Status: DC | PRN
  Administered 2022-02-06: 70 mg via INTRAVENOUS

## 2022-02-06 MED ORDER — SODIUM CHLORIDE 0.9 % IV SOLN: INTRAVENOUS | Status: DC

## 2022-02-06 NOTE — Transfer of Care (Signed)
Immediate Anesthesia Transfer of Care Note  Patient: Dakota Martin  Procedure(s) Performed: CARDIOVERSION  Patient Location: Nursing Unit  Anesthesia Type:General  Level of Consciousness: drowsy, patient cooperative and responds to stimulation  Airway & Oxygen Therapy: Patient Spontanous Breathing  Post-op Assessment: Report given to RN and Post -op Vital signs reviewed and stable  Post vital signs: Reviewed and stable  Last Vitals:  Vitals Value Taken Time  BP    Temp    Pulse    Resp    SpO2      Last Pain:  Vitals:   02/06/22 0816  TempSrc: Temporal  PainSc: 0-No pain         Complications: No notable events documented.

## 2022-02-06 NOTE — Anesthesia Preprocedure Evaluation (Signed)
Anesthesia Evaluation  Patient identified by MRN, date of birth, ID band Patient awake    Reviewed: Allergy & Precautions, H&P , NPO status , Patient's Chart, lab work & pertinent test results  Airway Mallampati: II   Neck ROM: full    Dental   Pulmonary neg pulmonary ROS,    breath sounds clear to auscultation       Cardiovascular hypertension, + CABG  + dysrhythmias Atrial Fibrillation + pacemaker + Valvular Problems/Murmurs  Rhythm:irregular Rate:Normal  S/p AVR, mechanical valve.  Ascending aortic aneurysm 5cm.   Neuro/Psych    GI/Hepatic   Endo/Other    Renal/GU      Musculoskeletal  (+) Arthritis ,   Abdominal   Peds  Hematology   Anesthesia Other Findings   Reproductive/Obstetrics                             Anesthesia Physical Anesthesia Plan  ASA: 3  Anesthesia Plan: General   Post-op Pain Management:    Induction: Intravenous  PONV Risk Score and Plan: 2 and Propofol infusion and Treatment may vary due to age or medical condition  Airway Management Planned: Mask  Additional Equipment:   Intra-op Plan:   Post-operative Plan:   Informed Consent: I have reviewed the patients History and Physical, chart, labs and discussed the procedure including the risks, benefits and alternatives for the proposed anesthesia with the patient or authorized representative who has indicated his/her understanding and acceptance.     Dental advisory given  Plan Discussed with: CRNA, Anesthesiologist and Surgeon  Anesthesia Plan Comments:         Anesthesia Quick Evaluation

## 2022-02-06 NOTE — Discharge Instructions (Signed)

## 2022-02-06 NOTE — CV Procedure (Signed)
    Electrical Cardioversion Procedure Note Dakota Martin [id number] 04-14-1941  Procedure: Electrical Cardioversion Indications:  Atrial Flutter  Time Out: Verified patient identification, verified procedure,medications/allergies/relevent history reviewed, required imaging and test results available.  Performed  Procedure Details  The patient was NPO after midnight. Anesthesia was administered at the beside  by Dr.Hodierne with '60mg'$  of Lidocaine and '70mg'$  of propofol.  Cardioversion was done with synchronized biphasic defibrillation with AP pads with 150watts.  The patient converted to normal sinus rhythm. The patient tolerated the procedure well   IMPRESSION:  Successful cardioversion of Atrial Flutter    Dakota Martin 02/06/2022, 9:07 AM

## 2022-02-06 NOTE — Interval H&P Note (Signed)
History and Physical Interval Note:  02/06/2022 9:06 AM  Dakota Martin  has presented today for surgery, with the diagnosis of ATRIAL FIBRILLATION.  The various methods of treatment have been discussed with the patient and family. After consideration of risks, benefits and other options for treatment, the patient has consented to  Procedure(s): CARDIOVERSION (N/A) as a surgical intervention.  The patient's history has been reviewed, patient examined, no change in status, stable for surgery.  I have reviewed the patient's chart and labs.  Questions were answered to the patient's satisfaction.     Fransico Him

## 2022-02-06 NOTE — Anesthesia Postprocedure Evaluation (Signed)
Anesthesia Post Note  Patient: [name]  Procedure(Martin) Performed: CARDIOVERSION     Patient location during evaluation: Endoscopy Anesthesia Type: General Level of consciousness: awake and alert Pain management: pain level controlled Vital Signs Assessment: post-procedure vital signs reviewed and stable Respiratory status: spontaneous breathing, nonlabored ventilation, respiratory function stable and patient connected to nasal cannula oxygen Cardiovascular status: blood pressure returned to baseline and stable Postop Assessment: no apparent nausea or vomiting Anesthetic complications: no   No notable events documented.  Last Vitals:  Vitals:   02/06/22 0950 02/06/22 0954  BP: 129/67   Pulse: 65 66  Resp: 14 14  Temp:    SpO2: 96% 96%    Last Pain:  Vitals:   02/06/22 0954  TempSrc:   PainSc: 0-No pain                 Dakota Martin

## 2022-02-08 ENCOUNTER — Encounter (HOSPITAL_COMMUNITY): Payer: Self-pay | Admitting: Cardiology

## 2022-02-13 ENCOUNTER — Ambulatory Visit (HOSPITAL_COMMUNITY)
Admission: RE | Admit: 2022-02-13 | Discharge: 2022-02-13 | Disposition: A | Discharge status: Home / Self Care | Payer: Medicare Other | Source: Ambulatory Visit | Attending: Nurse Practitioner | Admitting: Nurse Practitioner

## 2022-02-13 ENCOUNTER — Encounter (HOSPITAL_COMMUNITY): Payer: Self-pay | Admitting: Nurse Practitioner

## 2022-02-13 VITALS — BP 158/72 | HR 93 | Ht 68.0 in | Wt 174.0 lb

## 2022-02-13 DIAGNOSIS — I4891 Unspecified atrial fibrillation: Secondary | ICD-10-CM

## 2022-02-13 DIAGNOSIS — Z7901 Long term (current) use of anticoagulants: Secondary | ICD-10-CM | POA: Insufficient documentation

## 2022-02-13 DIAGNOSIS — I10 Essential (primary) hypertension: Secondary | ICD-10-CM | POA: Insufficient documentation

## 2022-02-13 DIAGNOSIS — I4819 Other persistent atrial fibrillation: Secondary | ICD-10-CM | POA: Insufficient documentation

## 2022-02-13 DIAGNOSIS — D6869 Other thrombophilia: Secondary | ICD-10-CM | POA: Diagnosis not present

## 2022-02-13 NOTE — Progress Notes (Signed)
Primary Care Physician: Dakota Infante, MD Referring Physician: Device clinic  Cardiologist: Dr. Acie Fredrickson  EP: Dr. Desmond Lope Barrett Martin is a 81 y.o. male with a h/o HTN, s/p  mechanical AV replacement, Dakota Martin valve in 1982  on coumadin at that time.  In 2018, he had redo AVR with biologic Bentall with a bovine pericardial valve  repair of proximal ascending aorta and coronary bypass grafting x2 in 2018. He received a PPM at  time of surgery.  He also  came off coumadin, went on ASA then.   He is now in the afib clinic to discuss starting anticoagulation for new onset afib, notified for ongoing afib episode by the device clinic.    He was contacted by the device clinic 9/7 that he has had ongoing afib  at that time with good ventricular rate control. Pt is unaware. He also had a CT of the chest today for f/u with Dr. Roxan Martin on upcoming Tuesday appointment.This looks stable.  He states he is active, no sleep apnea, he drinks 2 gin and tonics a day. Ekg shows v paced at 62 bpm. He did not have any bleeding issues with prior  coumadin use.   He is now in the afib clinic for f/u of  new onset afib.  He is still asymptomatic. He has many questions for me that are answered today. He is not having any issues with eliquis. He sent a report to the device clinic this am and wants me to review.  It reveals persistent afib as the ekg does today as well. We discussed cardioversion and he  wants to proceed.   F/u in afib clinic 02/13/22 s/p successful cardioversion. He remains in SR today by ekg showing a sensed v paced pattern at 93 bpm. He feels unchanged after cardioversion.   Today, he denies symptoms of palpitations, chest pain, shortness of breath, orthopnea, PND, lower extremity edema, dizziness, presyncope, syncope, or neurologic sequela. The patient is tolerating medications without difficulties and is otherwise without complaint today.   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, has slight loss of vision   S/P aortic valve replacement with metallic valve 21/19/4174   Due to congenital aortic stenosis // Dakota Martin mechanical valve done in 1982 // Echo 09/10/16: Mild focal basal septal hypertrophy, [name], [name], [name], mod aortic root enlargement, aortic root 36 mm, ascending aorta 49 mm, [name], [name]   Past Surgical History:  Procedure Laterality Date   Aortic valve  1982   prosthetic  val ve   AORTIC VALVE REPLACEMENT N/A 11/21/2016   Procedure: REDO AORTIC VALVE REPLACEMENT (AVR);  Surgeon: Dakota Nakayama, MD;  Location: [facility];  Service: Open Heart Surgery;  Laterality: N/A;   CARDIAC CATHETERIZATION     CARDIOVERSION N/A 02/06/2022   Procedure: CARDIOVERSION;  Surgeon: Dakota Margarita, MD;  Location: [facility];  Service: Cardiovascular;  Laterality: N/A;   CORONARY ARTERY  BYPASS GRAFT N/A 11/21/2016   Procedure: CORONARY ARTERY BYPASS GRAFTING (CABG) x 2, using right leg greater saphenous vein harvested endoscopically;  Surgeon: Dakota Nakayama, MD;  Location: [facility];  Service: Open Heart Surgery;  Laterality: N/A;   cyst removed  1976   right side of neck   PACEMAKER IMPLANT N/A 11/26/2016   Procedure: Pacemaker Implant;  Surgeon: Deboraha Sprang, MD;  Location: [facility];  Service: Cardiovascular;  Laterality: N/A;   TEE WITHOUT CARDIOVERSION N/A 11/21/2016   Procedure: TRANSESOPHAGEAL ECHOCARDIOGRAM (TEE);  Surgeon: Dakota Nakayama, MD;  Location: [facility];  Service: Open Heart Surgery;   Laterality: N/A;   THORACIC AORTIC ANEURYSM REPAIR N/A 11/21/2016   Procedure: THORACIC ASCENDING ANEURYSM REPAIR (AAA);  Surgeon: Dakota Nakayama, MD;  Location: [facility];  Service: Open Heart Surgery;  Laterality: N/A;   TONSILLECTOMY      Current Outpatient Medications  Medication Sig Dispense Refill   acetaminophen (TYLENOL) 500 MG tablet Take 500-1,000 mg by mouth every 6 (six) hours as needed (pain.).     allopurinol (ZYLOPRIM) 300 MG tablet Take 300 mg by mouth daily at 12 noon.      apixaban (ELIQUIS) 5 MG TABS tablet Take 1 tablet (5 mg total) by mouth 2 (two) times daily. 180 tablet 1   atorvastatin (LIPITOR) 20 MG tablet Take 10 mg by mouth daily at 12 noon.     carvedilol (COREG) 6.25 MG tablet Take 1.5 tablets (9.375 mg total) by mouth 2 (two) times daily with a meal. 270 tablet 1   Cholecalciferol (VITAMIN D) 125 MCG (5000 UT) CAPS Take 5,000 Units by mouth every Tuesday, Thursday, and Saturday at 6 PM.     Coenzyme Q10 100 MG capsule Take 100 mg by mouth every evening.     Cyanocobalamin (VITAMIN B-12) 2500 MCG SUBL Place 2,500 mcg under the tongue 2 (two) times a week. Wednesdays & Saturdays in the morning     ezetimibe (ZETIA) 10 MG tablet Take 5 mg by mouth in the morning.     MAGNESIUM CITRATE PO Take 500 mg by mouth every evening.     sildenafil (VIAGRA) 100 MG tablet 1/2 to 1  tab 30 min. to 4 hours prior to sexual activity Orally     TART CHERRY PO Take 3,600 mg by mouth in the morning. 1200 mg/capsule     telmisartan (MICARDIS) 80 MG tablet Take 80 mg by mouth every evening.  11   No current facility-administered medications for this encounter.    Allergies  Allergen Reactions   Benazepril Other (See Comments)    Social History   Socioeconomic History   Marital status: Married    Spouse name: Not on file   Number of children: 2   Years of education: Not on file   Highest education level: Not on file  Occupational History   Occupation: Retired  Tobacco  Use   Smoking status: Never   Smokeless tobacco: Never  Vaping Use   Vaping Use: Never used  Substance and Sexual Activity   Alcohol use: Yes    Alcohol/week: 0.0 standard drinks of alcohol    Comment: 2 drinks a day   Drug use: No   Sexual activity: Not on file  Other Topics Concern   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, clinical   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   Gym 1x wk   Yoga  1 x wk   Social Determinants of Health   Financial Resource Strain: Not on file  Food Insecurity: Not on file  Transportation Needs: Not on file  Physical Activity: Not on file  Stress: Not on file  Social Connections: Not on file  Intimate Partner Violence: Not on file    Family History  Problem Relation Age of Onset   Hyperlipidemia Mother    Colon cancer Mother    Emphysema Father    Other Father        respiratory failure   Diabetes Maternal Grandfather    Colon polyps Neg Hx    Kidney disease Neg Hx    Esophageal cancer Neg Hx    Gallbladder disease Neg Hx     ROS- All systems are reviewed and negative except as per the HPI above  Physical Exam: Vitals:   02/13/22 1422  BP: (!) 158/72  Pulse: 93  Weight: 78.9 kg  Height: _0  (1.727 m)   Wt Readings from Last 3 Encounters:  02/13/22 78.9 kg  02/06/22 77.1 kg  01/24/22 80.2 kg    Labs: Lab Results  Component Value Date   NA 138 01/24/2022   K 4.6 01/24/2022   CL 103 01/24/2022   CO2 24 01/24/2022   GLUCOSE 109 (H) 01/24/2022   BUN 21 01/24/2022   CREATININE 1.20 01/24/2022   CALCIUM 9.6 01/24/2022   MG 2.2 11/22/2016   Lab Results  Component Value Date   INR 1.31 11/30/2016   No results found for: "CHOL", "HDL", "LDLCALC", "TRIG"   GEN- The patient is well appearing, alert and oriented x 3 today.   Head- normocephalic, atraumatic Eyes-  Sclera clear, conjunctiva pink Ears- hearing intact Oropharynx- clear Neck- supple, no JVP Lymph- no cervical  lymphadenopathy Lungs- Clear to ausculation bilaterally, normal work of breathing Heart- regular rate and rhythm, no murmurs, rubs or gallops, PMI not laterally displaced GI- soft, NT, ND, + BS Extremities- no clubbing, cyanosis, or edema MS- no significant deformity or atrophy Skin- no rash or lesion Psych- euthymic mood, full affect Neuro- strength and sensation are intact  Ekg- Vent. rate 93 BPM PR interval 210 ms QRS duration 174 ms QT/QTcB 426/529 ms P-R-T axes 104 -76 96 Atrial-sensed ventricular-paced rhythm with prolonged AV conduction Abnormal ECG When compared with ECG of 06-Feb-2022 09:35, PREVIOUS ECG IS PRESENT   Assessment and Plan:  1. New onset Afib/AFL Persistent since 01/04/22  Pt was asymptomatic Successful cardioversion 02/06/22 and is in SR today Continue carvedilol  6.25 mg bid at 1 1/2 bid    2. CHA2DS2VASc score of at least 4 He will continue on eliquis 5 mg bid    He will f/u with per recall with Oda Kilts in December  and Dr. Cathie Olden in January as well as remote checks, next due 04/07/22 Afib clinic as needed   Butch Penny C. Telissa Palmisano, [facility] [address] [place], [place] [address] [phone]

## 2022-03-08 ENCOUNTER — Ambulatory Visit (INDEPENDENT_AMBULATORY_CARE_PROVIDER_SITE_OTHER): Payer: Medicare Other

## 2022-03-08 DIAGNOSIS — I442 Atrioventricular block, complete: Secondary | ICD-10-CM | POA: Diagnosis not present

## 2022-03-08 LAB — CUP PACEART REMOTE DEVICE CHECK
Battery Remaining Longevity: 58 mo
Battery Voltage: 2.96 V
Brady Statistic AP VP Percent: 42.66 %
Brady Statistic AP VS Percent: 0 %
Brady Statistic AS VP Percent: 57.21 %
Brady Statistic AS VS Percent: 0.13 %
Brady Statistic RA Percent Paced: 42.25 %
Brady Statistic RV Percent Paced: 99.86 %
Date Time Interrogation Session: 20231109000748
Implantable Lead Connection Status: 753985
Implantable Lead Connection Status: 753985
Implantable Lead Implant Date: 20180730
Implantable Lead Implant Date: 20180730
Implantable Lead Location: 753859
Implantable Lead Location: 753860
Implantable Lead Model: 5076
Implantable Lead Model: 5076
Implantable Pulse Generator Implant Date: 20180730
Lead Channel Impedance Value: 323 Ohm
Lead Channel Impedance Value: 399 Ohm
Lead Channel Impedance Value: 437 Ohm
Lead Channel Impedance Value: 456 Ohm
Lead Channel Pacing Threshold Amplitude: 0.5 V
Lead Channel Pacing Threshold Amplitude: 0.625 V
Lead Channel Pacing Threshold Pulse Width: 0.4 ms
Lead Channel Pacing Threshold Pulse Width: 0.4 ms
Lead Channel Sensing Intrinsic Amplitude: 1.125 mV
Lead Channel Sensing Intrinsic Amplitude: 1.125 mV
Lead Channel Sensing Intrinsic Amplitude: 20 mV
Lead Channel Sensing Intrinsic Amplitude: 20 mV
Lead Channel Setting Pacing Amplitude: 1.5 V
Lead Channel Setting Pacing Amplitude: 2.5 V
Lead Channel Setting Pacing Pulse Width: 0.4 ms
Lead Channel Setting Sensing Sensitivity: 4 mV
Zone Setting Status: 755011
Zone Setting Status: 755011

## 2022-03-16 NOTE — Progress Notes (Signed)
Remote pacemaker transmission.   

## 2022-04-03 ENCOUNTER — Other Ambulatory Visit (HOSPITAL_COMMUNITY): Payer: Self-pay | Admitting: Nurse Practitioner

## 2022-04-12 NOTE — Progress Notes (Signed)
Electrophysiology Office Note Date: 04/16/2022  ID:  [name], [name] 24-Dec-1940, MRN [id number]  PCP: Crist Infante, MD Primary Cardiologist: Mertie Moores, MD Electrophysiologist: Cristopher Peru, MD   CC: Pacemaker follow-up  Dakota Martin is a 81 y.o. male seen today for Cristopher Peru, MD for routine electrophysiology followup. Since last being seen in our clinic the patient reports doing well overall. No further AF that he has noticed since AF clinic visit this fall and [facility]  he denies chest pain, palpitations, dyspnea, PND, orthopnea, nausea, vomiting, dizziness, syncope, edema, weight gain, or early satiety.   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, has slight loss of vision   S/P aortic valve replacement with metallic valve 70/17/7939   Due to congenital aortic stenosis // [name] mechanical valve done in 1982 // Echo 09/10/16: Mild focal basal septal hypertrophy, [name], [name], [name], mod aortic root enlargement, aortic root 36 mm, ascending aorta 49 mm, [name], [name]   Past Surgical History:  Procedure Laterality Date   Aortic valve  1982   prosthetic  val ve   AORTIC VALVE REPLACEMENT N/A 11/21/2016   Procedure: REDO AORTIC VALVE REPLACEMENT (AVR);  Surgeon: Melrose Nakayama, MD;  Location: [place];  Service: Open Heart Surgery;  Laterality: N/A;   CARDIAC CATHETERIZATION     CARDIOVERSION N/A 02/06/2022   Procedure: CARDIOVERSION;   Surgeon: Sueanne Margarita, MD;  [facility];  Service: Cardiovascular;  Laterality: N/A;   CORONARY ARTERY BYPASS GRAFT N/A 11/21/2016   Procedure: CORONARY ARTERY BYPASS GRAFTING (CABG) x 2, using right leg greater saphenous vein harvested endoscopically;  Surgeon: Melrose Nakayama, MD;  Location: [place];  Service: Open Heart Surgery;  Laterality: N/A;   cyst removed  1976   right side of neck   PACEMAKER IMPLANT N/A 11/26/2016   Procedure: Pacemaker Implant;  Surgeon: Deboraha Sprang, MD;  Location: [place] [facility];  Service: Cardiovascular;  Laterality: N/A;   TEE WITHOUT CARDIOVERSION N/A 11/21/2016   Procedure: TRANSESOPHAGEAL ECHOCARDIOGRAM (TEE);  Surgeon: Melrose Nakayama, MD;  Location: [place];  Service: Open Heart Surgery;  Laterality: N/A;   THORACIC AORTIC ANEURYSM REPAIR N/A 11/21/2016   Procedure: THORACIC ASCENDING ANEURYSM REPAIR (AAA);  Surgeon: Melrose Nakayama, MD;  Location: [place];  Service: Open Heart Surgery;  Laterality: N/A;   TONSILLECTOMY      Current Outpatient Medications  Medication Sig Dispense Refill   acetaminophen (TYLENOL) 500 MG tablet Take 500-1,000 mg by mouth every 6 (six) hours as needed (pain.).     allopurinol (ZYLOPRIM) 300 MG tablet Take 300 mg by mouth daily at 12 noon.      atorvastatin (LIPITOR) 10 MG tablet TAKE 1 TABLET BY MOUTH ONCE DAILY for 90 days     atorvastatin (LIPITOR) 20 MG tablet Take 10 mg by mouth daily at 12 noon.  carvedilol (COREG) 6.25 MG tablet Take 1.5 tablets (9.375 mg total) by mouth 2 (two) times daily with a meal. 270 tablet 1   Cholecalciferol (VITAMIN D) 125 MCG (5000 UT) CAPS Take 5,000 Units by mouth every Tuesday, Thursday, and Saturday at 6 PM.     Coenzyme Q10 100 MG capsule Take 100 mg by mouth every evening.     Cyanocobalamin (VITAMIN B-12) 2500 MCG SUBL Place 2,500 mcg under the tongue 2 (two) times a week. Wednesdays & Saturdays in the morning     ELIQUIS 5 MG TABS tablet TAKE 1 TABLET  BY MOUTH TWICE  DAILY 200 tablet 2   ezetimibe (ZETIA) 10 MG tablet Take 5 mg by mouth in the morning.     MAGNESIUM CITRATE PO Take 500 mg by mouth every evening.     sildenafil (VIAGRA) 100 MG tablet 1/2 to 1  tab 30 min. to 4 hours prior to sexual activity Orally     telmisartan (MICARDIS) 80 MG tablet Take 80 mg by mouth every evening.  11   TART CHERRY PO Take 3,600 mg by mouth in the morning. 1200 mg/capsule (Patient not taking: Reported on 04/16/2022)     No current facility-administered medications for this visit.    Allergies:   Benazepril   Social History: Social History   Socioeconomic History   Marital status: Married    Spouse name: Not on file   Number of children: 2   Years of education: Not on file   Highest education level: Not on file  Occupational History   Occupation: Retired  Tobacco Use   Smoking status: Never   Smokeless tobacco: Never  Vaping Use   Vaping Use: Never used  Substance and Sexual Activity   Alcohol use: Yes    Alcohol/week: 0.0 standard drinks of alcohol    Comment: 2 drinks a day   Drug use: No   Sexual activity: Not on file  Other Topics Concern   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, clinical   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   Gym 1x wk   Yoga 1 x wk   Social Determinants of Radio broadcast assistant Strain: Not on file  Food Insecurity: Not on file  Transportation Needs: Not on file  Physical Activity: Not on file  Stress: Not on file  Social Connections: Not on file  Intimate Partner Violence: Not on file    Family History: Family History  Problem Relation Age of Onset   Hyperlipidemia Mother    Colon cancer Mother    Emphysema Father    Other Father        respiratory failure   Diabetes Maternal Grandfather    Colon polyps Neg Hx    Kidney disease Neg Hx    Esophageal cancer Neg Hx    Gallbladder disease Neg Hx      Review of Systems: All other systems reviewed and are  otherwise negative except as noted above.  Physical Exam: Vitals:   04/16/22 0926  BP: 128/66  Pulse: (!) 102  SpO2: 99%  Weight: 171 lb 9.6 oz (77.8 kg)  Height: _0  (1.727 m)     GEN- The patient is well appearing, alert and oriented x 3 today.   HEENT: normocephalic, atraumatic; sclera clear, conjunctiva pink; hearing intact; oropharynx clear; neck supple, no JVP Lymph- no cervical lymphadenopathy Lungs- Clear to ausculation bilaterally, normal work of breathing.  No wheezes, rales, rhonchi Heart- Regular  rate and rhythm, no murmurs, rubs or gallops, PMI not laterally displaced GI- soft, non-tender, non-distended, bowel sounds present, no hepatosplenomegaly Extremities- no clubbing or cyanosis. No peripheral edema; DP/PT/radial pulses 2+ bilaterally MS- no significant deformity or atrophy Skin- warm and dry, no rash or lesion; PPM pocket well healed Psych- euthymic mood, full affect Neuro- strength and sensation are intact  PPM Interrogation-  reviewed in detail today,  See PACEART report.  EKG:  EKG is not ordered today. Personal review of ekg ordered  02/13/2022  shows AS VP in 90s   Recent Labs: 01/24/2022: ALT 36; BUN 21; Creatinine, Ser 1.20; Hemoglobin 13.5; Platelets 138; Potassium 4.6; Sodium 138; TSH 3.058   Wt Readings from Last 3 Encounters:  04/16/22 171 lb 9.6 oz (77.8 kg)  02/13/22 174 lb (78.9 kg)  02/06/22 170 lb (77.1 kg)     Other studies Reviewed: Additional studies/ records that were reviewed today include: Previous EP office notes, Previous remote checks, Most recent labwork.     Assessment and Plan:  1. CHB s/p Medtronic PPM  Normal PPM function See Pace Art report No changes today  2. HTN Stable on current regimen   3. Paroxysmal atrial fib/AFL Continue coreg  Continue eliquis 5 mg BID  Current medicines are reviewed at length with the patient today.    Labs/ tests ordered today include:  No orders of the defined types were  placed in this encounter.    Disposition:   Follow up with Dr. Lovena Le in 6 months    Signed, Shirley Friar, PA-C  04/16/2022 9:35 AM  [facility] [address] [place] [address] [phone] (office) [phone] (fax)

## 2022-04-16 ENCOUNTER — Encounter: Payer: Self-pay | Admitting: Student

## 2022-04-16 ENCOUNTER — Ambulatory Visit: Payer: Medicare Other | Attending: Student | Admitting: Student

## 2022-04-16 VITALS — BP 128/66 | HR 102 | Ht 68.0 in | Wt 171.6 lb

## 2022-04-16 DIAGNOSIS — I4891 Unspecified atrial fibrillation: Secondary | ICD-10-CM | POA: Diagnosis not present

## 2022-04-16 DIAGNOSIS — I442 Atrioventricular block, complete: Secondary | ICD-10-CM | POA: Diagnosis not present

## 2022-04-16 DIAGNOSIS — I10 Essential (primary) hypertension: Secondary | ICD-10-CM

## 2022-04-16 LAB — CUP PACEART INCLINIC DEVICE CHECK
Battery Remaining Longevity: 56 mo
Battery Voltage: 2.96 V
Brady Statistic AP VP Percent: 46.24 %
Brady Statistic AP VS Percent: 0 %
Brady Statistic AS VP Percent: 53.65 %
Brady Statistic AS VS Percent: 0.11 %
Brady Statistic RA Percent Paced: 45.76 %
Brady Statistic RV Percent Paced: 99.88 %
Date Time Interrogation Session: 20231218101614
Implantable Lead Connection Status: 753985
Implantable Lead Connection Status: 753985
Implantable Lead Implant Date: 20180730
Implantable Lead Implant Date: 20180730
Implantable Lead Location: 753859
Implantable Lead Location: 753860
Implantable Lead Model: 5076
Implantable Lead Model: 5076
Implantable Pulse Generator Implant Date: 20180730
Lead Channel Impedance Value: 342 Ohm
Lead Channel Impedance Value: 418 Ohm
Lead Channel Impedance Value: 456 Ohm
Lead Channel Impedance Value: 513 Ohm
Lead Channel Pacing Threshold Amplitude: 0.5 V
Lead Channel Pacing Threshold Amplitude: 0.625 V
Lead Channel Pacing Threshold Pulse Width: 0.4 ms
Lead Channel Pacing Threshold Pulse Width: 0.4 ms
Lead Channel Sensing Intrinsic Amplitude: 1.25 mV
Lead Channel Sensing Intrinsic Amplitude: 1.375 mV
Lead Channel Sensing Intrinsic Amplitude: 20 mV
Lead Channel Sensing Intrinsic Amplitude: 20 mV
Lead Channel Setting Pacing Amplitude: 1.5 V
Lead Channel Setting Pacing Amplitude: 2.5 V
Lead Channel Setting Pacing Pulse Width: 0.4 ms
Lead Channel Setting Sensing Sensitivity: 4 mV
Zone Setting Status: 755011
Zone Setting Status: 755011

## 2022-04-16 NOTE — Patient Instructions (Signed)
Medication Instructions:  Your physician recommends that you continue on your current medications as directed. Please refer to the Current Medication list given to you today.  *If you need a refill on your cardiac medications before your next appointment, please call your pharmacy*   Lab Work: None If you have labs (blood work) drawn today and your tests are completely normal, you will receive your results only by: [place] (if you have MyChart) OR A paper copy in the mail If you have any lab test that is abnormal or we need to change your treatment, we will call you to review the results.   Follow-Up: At [facility], you and your health needs are our priority.  As part of our continuing mission to provide you with exceptional heart care, we have created designated Provider Care Teams.  These Care Teams include your primary Cardiologist (physician) and Advanced Practice Providers (APPs -  Physician Assistants and Nurse Practitioners) who all work together to provide you with the care you need, when you need it.   Your next appointment:   6 month(s)  The format for your next appointment:   In Person  Provider:   Cristopher Peru, MD     Important Information About Sugar

## 2022-05-19 ENCOUNTER — Other Ambulatory Visit: Payer: Self-pay | Admitting: Cardiovascular Disease

## 2022-05-24 ENCOUNTER — Telehealth: Payer: Self-pay | Admitting: Gastroenterology

## 2022-05-24 NOTE — Telephone Encounter (Signed)
Patient called to find out if the doctor thinks he needs a colonoscopy this year.  Last colonoscopy was July 2021 and he was told he would possibly need to do another in 3-5 years.  As he had polyps last time, he wanted to know if he should have one this year.  Please advise patient what he should do.

## 2022-05-24 NOTE — Telephone Encounter (Signed)
Returned call to patient. We reviewed 2021 pathology recommendations. "Based on current nationally recognized surveillance guidelines, one would normally recommend that you have a repeat colonoscopy in 3 years, however at that time you will be 82 years old, an age at which most patients stop having routine surveillance as risks can start to outweigh benefits. In this light I don't feel strongly that you need any further colonoscopy exams, however you can see me in the office at that time if you feel differently. " I advised patient of the recommendations. Pt states that he is doing fine right now, he will call to schedule office visit if anything changes. Pt had no other concerns at the end of the call.

## 2022-06-07 ENCOUNTER — Ambulatory Visit (INDEPENDENT_AMBULATORY_CARE_PROVIDER_SITE_OTHER): Payer: Medicare Other

## 2022-06-07 ENCOUNTER — Encounter (HOSPITAL_COMMUNITY): Payer: Self-pay | Admitting: *Deleted

## 2022-06-07 DIAGNOSIS — I442 Atrioventricular block, complete: Secondary | ICD-10-CM | POA: Diagnosis not present

## 2022-06-07 LAB — CUP PACEART REMOTE DEVICE CHECK
Battery Remaining Longevity: 52 mo
Battery Voltage: 2.95 V
Brady Statistic AP VP Percent: 52.48 %
Brady Statistic AP VS Percent: 0 %
Brady Statistic AS VP Percent: 47.4 %
Brady Statistic AS VS Percent: 0.11 %
Brady Statistic RA Percent Paced: 51.9 %
Brady Statistic RV Percent Paced: 99.88 %
Date Time Interrogation Session: 20240208000420
Implantable Lead Connection Status: 753985
Implantable Lead Connection Status: 753985
Implantable Lead Implant Date: 20180730
Implantable Lead Implant Date: 20180730
Implantable Lead Location: 753859
Implantable Lead Location: 753860
Implantable Lead Model: 5076
Implantable Lead Model: 5076
Implantable Pulse Generator Implant Date: 20180730
Lead Channel Impedance Value: 323 Ohm
Lead Channel Impedance Value: 418 Ohm
Lead Channel Impedance Value: 437 Ohm
Lead Channel Impedance Value: 456 Ohm
Lead Channel Pacing Threshold Amplitude: 0.625 V
Lead Channel Pacing Threshold Amplitude: 0.625 V
Lead Channel Pacing Threshold Pulse Width: 0.4 ms
Lead Channel Pacing Threshold Pulse Width: 0.4 ms
Lead Channel Sensing Intrinsic Amplitude: 1.25 mV
Lead Channel Sensing Intrinsic Amplitude: 1.25 mV
Lead Channel Sensing Intrinsic Amplitude: 20 mV
Lead Channel Sensing Intrinsic Amplitude: 20 mV
Lead Channel Setting Pacing Amplitude: 1.5 V
Lead Channel Setting Pacing Amplitude: 2.5 V
Lead Channel Setting Pacing Pulse Width: 0.4 ms
Lead Channel Setting Sensing Sensitivity: 4 mV
Zone Setting Status: 755011
Zone Setting Status: 755011

## 2022-06-27 ENCOUNTER — Ambulatory Visit: Payer: Medicare Other | Attending: Cardiovascular Disease | Admitting: Cardiovascular Disease

## 2022-06-27 ENCOUNTER — Encounter: Payer: Self-pay | Admitting: Cardiovascular Disease

## 2022-06-27 VITALS — BP 132/74 | HR 68 | Ht 68.0 in | Wt 171.2 lb

## 2022-06-27 DIAGNOSIS — E785 Hyperlipidemia, unspecified: Secondary | ICD-10-CM | POA: Diagnosis not present

## 2022-06-27 DIAGNOSIS — I10 Essential (primary) hypertension: Secondary | ICD-10-CM

## 2022-06-27 DIAGNOSIS — I2510 Atherosclerotic heart disease of native coronary artery without angina pectoris: Secondary | ICD-10-CM | POA: Insufficient documentation

## 2022-06-27 DIAGNOSIS — I442 Atrioventricular block, complete: Secondary | ICD-10-CM

## 2022-06-27 DIAGNOSIS — I4891 Unspecified atrial fibrillation: Secondary | ICD-10-CM

## 2022-06-27 DIAGNOSIS — Z952 Presence of prosthetic heart valve: Secondary | ICD-10-CM

## 2022-06-27 DIAGNOSIS — I25118 Atherosclerotic heart disease of native coronary artery with other forms of angina pectoris: Secondary | ICD-10-CM

## 2022-06-27 HISTORY — DX: Atherosclerotic heart disease of native coronary artery without angina pectoris: I25.10

## 2022-06-27 NOTE — Progress Notes (Signed)
Cardiology Office Note   Date:  06/27/2022   ID:  Jermond, Triglia [birth year], MRN [id number]  PCP:  Crist Infante, MD  Cardiologist:   Mertie Moores, MD   Chief Complaint  Patient presents with   Hypertension        aortic valve replacement      August 17, 2014:  Dakota Martin is a 82 y.o. male who presents for follow-up of his essential hypertension and mild aortic root dilatation. We had HCTZ and Kdur at his last visit.  BP readings have been well controlled.    Expand All Collapse All     Problem List: 1. 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   2. Chronic coumadin therapy.   3. Hypertension 4. Hyperlipidemia 5. Moderate dilation of the ascending aorta ( 50 mm)   History of Present Illness:  Dakota Martin  is a 82 yo with hx of AVR in 1982.   He has been doing well.  No CP , no dyspnea.    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    Oct. 27, 2015:  Dakota Martin  is seen back for a scheduled visit. He had a recent echo that revealed a dilated aortic root ( 50 cm)     Jan. 19, 2016:  Dakota Martin  is seen back today for follow-up of his aortic valve replacement and history of hypertension. He has a dilated aortic root. Also has a history of hypertension and hyperlipidemia. He has been recording his BP daily. He has substituted generic Micardis ( Telmisartan)  for his Benicar  August 17, 2014:    Nov. 4, 2016:  Dakota Martin is seen back today for follow-up visit for his aortic valve replacement and aortic aneurysm. Doing great.  Feeling well.  Has seen Dr. Roxan Hockey - the ascending aortic aneurism seems stable .  Playing tennis regularly  - plays 2-3 times a week .  Going for a right neck lymph node bx.  Bridging with Lovenox.  ( Managed by Medical Resource Manager )   Sep 14, 2015:  Doing well. Playing tennis . Lots of yard work . Had a lymph node  bx last Nov.   Was benign.   Was cystic..  Drained it but it has returned.   Sept. 4, 2018:  Dakota Martin is doing great after his asc. Aortic aneurism repair / AVR ( bioprosthetic ) / CABG  Had a pacer placed later in the hospitalization.  Will follow up with Dr. Lovena Le  Overall doing well.   His appetite has not fully recovered.  Has minimal chest wall tenderness.   The plan is to stop his coumadin for another month - INR levels are managed by MRM at [facility]'s office.  He asked about the statin   March 14, 2018:.  Is doing well after his a sending aortic aneurysm repair/aortic valve replacement.  He also has coronary artery bypass grafting.  He had a pacemaker placed later that hospitalization.  Active.  Plays tennis - 3 times a week .  No CP or dyspnea  Recent lipids  look great Chol = 115 HDL = 38 LDL = 62 Trigs = 73 His ALT was a little high.  He will follow-up with Dr. Joylene Draft on that.  Dec. 9, 2020    Doing well. No cp or dyspnea Plays indoor tennis ( has been trying to  wear his mask )  No CP or dyspnea.   Labs from Dr. Tempie Donning office March 24, 2019: Cholesterol is 138.  HDL is 49.  LDL 74.  Triglyceride level 77.  Hemoglobin is 15.1.  Creatinine is 1.1. BP is well controlled at home Watches his salt intake   Jan. 28, 2022: BP is well controlled. At home  No CP or dyspnea  Last echo in May, 2018 showed normal LV function .  He has a 4.0 ascending aortic aneurysm that is being followed by Dr. Roxan Hockey.  Feb. 27, 2023   Pt rescheduled    Feb. 28, 2024  Dakota Martin is seen today for follow up of his CABG, [place] procedure ( July 2018 with  CABG x 2)     Was incidentally found to have atrial fib  Successful cardioversion Oct. 2023 Has been maintained on Eliquis    Was seen by Roderic Palau in the aifb clinic  in oct. 2023   He has numerous questions about Eliquis and various other DOACs  Concerned about how to treat his MSK pain.  He takes Ibuprofen and  Curcumin regularly   Dr. 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, has slight loss of vision   S/P aortic valve replacement with metallic valve [id number]   Due to congenital aortic stenosis // [name] mechanical valve done in 1982 // Echo 09/10/16: Mild focal basal septal hypertrophy, [name], [name], [name], mod aortic root enlargement, aortic root 36 mm, ascending aorta 49 mm, [name], [name]    Past Surgical History:  Procedure Laterality Date   Aortic valve  1982   prosthetic  val ve   AORTIC VALVE REPLACEMENT N/A 11/21/2016   Procedure: REDO AORTIC VALVE REPLACEMENT (AVR);  Surgeon: Melrose Nakayama, MD;  Location: [place];  Service: Open Heart Surgery;  Laterality: N/A;   CARDIAC CATHETERIZATION     CARDIOVERSION N/A 02/06/2022   Procedure: CARDIOVERSION;  Surgeon: Sueanne Margarita, MD;  [facility];  Service: Cardiovascular;  Laterality: N/A;   CORONARY ARTERY BYPASS GRAFT N/A 11/21/2016   Procedure: CORONARY ARTERY BYPASS GRAFTING (CABG) x 2, using right leg greater saphenous vein harvested endoscopically;  Surgeon: Melrose Nakayama, MD;  Location: [place];  Service: Open Heart Surgery;  Laterality: N/A;   cyst removed  1976   right side of neck   PACEMAKER IMPLANT N/A 11/26/2016   Procedure: Pacemaker Implant;  Surgeon: Deboraha Sprang, MD;  Location: [place] [facility];  Service: Cardiovascular;  Laterality: N/A;   TEE WITHOUT CARDIOVERSION N/A 11/21/2016   Procedure: TRANSESOPHAGEAL  ECHOCARDIOGRAM (TEE);  Surgeon: Melrose Nakayama, MD;  Location: [place];  Service: Open Heart Surgery;  Laterality: N/A;   THORACIC AORTIC ANEURYSM REPAIR N/A 11/21/2016   Procedure: THORACIC ASCENDING ANEURYSM REPAIR (AAA);  Surgeon: Melrose Nakayama, MD;  Location: [place];  Service: Open Heart Surgery;  Laterality: N/A;  TONSILLECTOMY       Current Outpatient Medications  Medication Sig Dispense Refill   acetaminophen (TYLENOL) 500 MG tablet Take 500-1,000 mg by mouth every 6 (six) hours as needed (pain.).     allopurinol (ZYLOPRIM) 300 MG tablet Take 300 mg by mouth daily at 12 noon.      atorvastatin (LIPITOR) 10 MG tablet TAKE 1 TABLET BY MOUTH ONCE DAILY for 90 days     carvedilol (COREG) 6.25 MG tablet Take 1.5 tablets (9.375 mg total) by mouth 2 (two) times daily with a meal. 270 tablet 2   Cholecalciferol (VITAMIN D) 125 MCG (5000 UT) CAPS Take 5,000 Units by mouth every Tuesday, Thursday, and Saturday at 6 PM.     Coenzyme Q10 100 MG capsule Take 100 mg by mouth every evening.     Cyanocobalamin (VITAMIN B-12) 2500 MCG SUBL Place 2,500 mcg under the tongue 2 (two) times a week. Wednesdays & Saturdays in the morning     ELIQUIS 5 MG TABS tablet TAKE 1 TABLET BY MOUTH TWICE  DAILY 200 tablet 2   ezetimibe (ZETIA) 10 MG tablet Take 5 mg by mouth in the morning.     MAGNESIUM CITRATE PO Take 500 mg by mouth every evening.     sildenafil (VIAGRA) 100 MG tablet 1/2 to 1  tab 30 min. to 4 hours prior to sexual activity Orally     TART CHERRY PO Take 3,600 mg by mouth in the morning. 1200 mg/capsule     telmisartan (MICARDIS) 80 MG tablet Take 80 mg by mouth every evening.  11   No current facility-administered medications for this visit.    Allergies:   Benazepril    Social History:  The patient  reports that he has never smoked. He has never used smokeless tobacco. He reports current alcohol use. He reports that he does not use drugs.   Family History:  The patient's family  history includes Colon cancer in his mother; Diabetes in his maternal grandfather; Emphysema in his father; Hyperlipidemia in his mother; Other in his father.    ROS:  Please see the history of present illness.   Otherwise, review of systems are positive for none.   All other systems are reviewed and negative.    Physical Exam: Blood pressure 132/74, pulse 68, height '5\' 8"'$  (1.727 m), weight 171 lb 3.2 oz (77.7 kg), SpO2 97 %.       GEN:  Well nourished, well developed in no acute distress HEENT: Normal NECK: No JVD; No carotid bruits LYMPHATICS: No lymphadenopathy CARDIAC: irreg. Irreg.   Soft systolic murmur  RESPIRATORY:  Clear to auscultation without rales, wheezing or rhonchi  ABDOMEN: Soft, non-tender, non-distended MUSCULOSKELETAL:  No edema; No deformity  SKIN: Warm and dry NEUROLOGIC:  Alert and oriented x 3  EKG:   June 27, 2022: AV pacing.  Intermittent premature ventricular contractions.   Recent Labs: 01/24/2022: ALT 36; BUN 21; Creatinine, Ser 1.20; Hemoglobin 13.5; Platelets 138; Potassium 4.6; Sodium 138; TSH 3.058    Lipid Panel No results found for: "CHOL", "TRIG", "HDL", "CHOLHDL", "VLDL", "LDLCALC", "LDLDIRECT"    Wt Readings from Last 3 Encounters:  06/27/22 171 lb 3.2 oz (77.7 kg)  04/16/22 171 lb 9.6 oz (77.8 kg)  02/13/22 174 lb (78.9 kg)      Other studies Reviewed:    ASSESSMENT AND PLAN:  1.  Essential Hypertension:   -    BP is stable .  Continue current medications.  2. Aortic  valve replacement:   -   stable , valve sound great   3. AV heart block:   - s/p pacer , pacer continues to work properly.  4. Hyperlipidemia:     [facility] is managing his lipids   5.  Paroxysmal atrial fibrillation: Patient has a history of atrial fibrillation.  Has been started on Eliquis 5 mg twice a day.  He was cardioverted in October, 2023.  He is he has maintained sinus rhythm since that time.  On exam today his heart rate was irregularly irregular but  this is because of intermittent AV pacing interspersed with normal atrial activity / P waves.  Current medicines are reviewed at length with the patient today.  The patient does not have concerns regarding medicines.  Labs/ tests ordered today include:   Orders Placed This Encounter  Procedures   AMB Referral to [facility] Pharm-D   EKG 12-Lead     Disposition:   FU  In 1 year with APP or me     Mertie Moores, MD  06/27/2022 1:49 PM    [facility] [place], [place], [place]  [address] Phone: [phone]; Fax: [phone]

## 2022-06-27 NOTE — Patient Instructions (Signed)
Medication Instructions:  Your physician recommends that you continue on your current medications as directed. Please refer to the Current Medication list given to you today.  *If you need a refill on your cardiac medications before your next appointment, please call your pharmacy*   Follow-Up: At [facility], you and your health needs are our priority.  As part of our continuing mission to provide you with exceptional heart care, we have created designated Provider Care Teams.  These Care Teams include your primary Cardiologist (physician) and Advanced Practice Providers (APPs -  Physician Assistants and Nurse Practitioners) who all work together to provide you with the care you need, when you need it.   Your next appointment:   1 year(s)  Provider:   Mertie Moores, MD

## 2022-06-28 ENCOUNTER — Telehealth: Payer: Self-pay | Admitting: *Deleted

## 2022-06-28 NOTE — Telephone Encounter (Signed)
A message was left for Dakota Martin to schedule his coumadin appointment.

## 2022-06-28 NOTE — Telephone Encounter (Signed)
Message was left for Dakota Martin to schedule appointment to see the Pharm-D,not the coumadin clinic.

## 2022-06-28 NOTE — Progress Notes (Signed)
Remote pacemaker transmission.   

## 2022-06-29 ENCOUNTER — Ambulatory Visit: Payer: Medicare Other | Attending: Cardiovascular Disease | Admitting: Pharmacist

## 2022-06-29 DIAGNOSIS — Z7901 Long term (current) use of anticoagulants: Secondary | ICD-10-CM | POA: Diagnosis not present

## 2022-06-29 NOTE — Progress Notes (Signed)
Patient ID: Dakota Martin                 DOB: 09/03/1940                    MRN: [id number]     HPI: Dakota Martin is a 82 y.o. male patient referred to PharmD by Dr Acie Fredrickson. PMH is significant for CABG in 2018, tissue AVR, HTN, HLD, and afib.   Pt with questions about his anticoagulation. Used to take Coumadin for many decades when he had a mechanical valve. In 2018 with his CABG, had redo valve with tissue valve. Now on Eliquis for afib indication. Received info from afib clinic stating complete cessation of alcohol, turmeric, and NSAIDs was recommended. Pt wondering about switching back to Coumadin.  Family History: The patient's family history includes Colon cancer in his mother; Diabetes in his maternal grandfather; Emphysema in his father; Hyperlipidemia in his mother; Other in his father.    Social History: The patient  reports that he has never smoked. He has never used smokeless tobacco. He reports current alcohol use. He reports that he does not use drugs.     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     allopurinol (ZYLOPRIM) 300 MG tablet Take 300 mg by mouth daily at 12 noon.      atorvastatin (LIPITOR) 10 MG tablet TAKE 1 TABLET BY MOUTH ONCE DAILY for 90 days     carvedilol (COREG) 6.25 MG tablet Take 1.5 tablets (9.375 mg total) by mouth 2 (two) times daily with a meal. 270 tablet 2   Cholecalciferol (VITAMIN D) 125 MCG (5000 UT) CAPS Take 5,000 Units by mouth every Tuesday, Thursday, and Saturday at 6 PM.     Coenzyme Q10 100 MG capsule Take 100 mg by mouth every evening.     Cyanocobalamin (VITAMIN B-12) 2500 MCG SUBL Place 2,500 mcg under the tongue 2 (two) times a week. Wednesdays & Saturdays in the morning     ELIQUIS 5 MG TABS tablet TAKE 1 TABLET BY MOUTH TWICE  DAILY 200 tablet 2   ezetimibe (ZETIA) 10 MG tablet Take 5 mg by mouth in the morning.     MAGNESIUM CITRATE PO Take 500 mg by mouth every evening.     sildenafil (VIAGRA) 100 MG tablet 1/2 to 1  tab 30 min. to 4 hours prior to sexual activity Orally  TART CHERRY PO Take 3,600 mg by mouth in the morning. 1200 mg/capsule     telmisartan (MICARDIS) 80 MG tablet Take 80 mg by mouth every evening.  11   No current facility-administered medications on file prior to visit.    Allergies  Allergen Reactions   Benazepril Other (See Comments)    Assessment/Plan:  1. Anticoagulation - Reviewed differences between Coumadin and DOACs including superior efficacy and safety data with Eliquis compared to other DOACs and Coumadin. Pt previously took turmeric which helped with pain in his shoulders. Has impacted his quality of life since he's stopped taking turmeric. Also would like to take a dose of ibuprofen before strenuous activity like playing tennis - potentially 1 or 2 doses of OTC '200mg'$  a week to help with ankle pain from old injury, turmeric had not helped ease this pain. Typically would enjoy a glass of wine with dinner  as well. Discussed that OTCs and alcohol can increase bleeding risk so all patients on anticoagulation are provided with these precautions, however taking Eliquis should not be impacting his quality of life. He is already taking the anticoagulant with the most favorable side effect profile. Advised pt it's reasonable for him to resume taking turmeric since it has been very beneficial in decreasing his shoulder pain. Ok to take a dose of ibuprofen every now and then with food, but should avoid chronic/high doses due to DOAC use and his cardiac history. Ok to indulge in occasional glass of wine as well. Discussed bruising/bleeding to look out for while on Eliquis and that bleeding risk associated with turmeric, NSAIDs, or alcohol are dose related. His '5mg'$  BID dosing is correct based on his age, weight, and renal function. Will continue with annual BMET monitoring to ensure he remains on the correct dose.  Jose Alleyne E. Teodora Baumgarten, PharmD, BCACP, [facility] [place]. [address], [place], [place] [address] Phone: [phone]; Fax: [phone] 06/29/2022 9:03 AM

## 2022-09-06 ENCOUNTER — Ambulatory Visit (INDEPENDENT_AMBULATORY_CARE_PROVIDER_SITE_OTHER): Payer: Medicare Other

## 2022-09-06 DIAGNOSIS — I442 Atrioventricular block, complete: Secondary | ICD-10-CM | POA: Diagnosis not present

## 2022-09-06 LAB — CUP PACEART REMOTE DEVICE CHECK
Battery Remaining Longevity: 44 mo
Battery Voltage: 2.95 V
Brady Statistic AP VP Percent: 48.52 %
Brady Statistic AP VS Percent: 0 %
Brady Statistic AS VP Percent: 51.4 %
Brady Statistic AS VS Percent: 0.08 %
Brady Statistic RA Percent Paced: 47.95 %
Brady Statistic RV Percent Paced: 99.92 %
Date Time Interrogation Session: 20240509010212
Implantable Lead Connection Status: 753985
Implantable Lead Connection Status: 753985
Implantable Lead Implant Date: 20180730
Implantable Lead Implant Date: 20180730
Implantable Lead Location: 753859
Implantable Lead Location: 753860
Implantable Lead Model: 5076
Implantable Lead Model: 5076
Implantable Pulse Generator Implant Date: 20180730
Lead Channel Impedance Value: 323 Ohm
Lead Channel Impedance Value: 418 Ohm
Lead Channel Impedance Value: 418 Ohm
Lead Channel Impedance Value: 456 Ohm
Lead Channel Pacing Threshold Amplitude: 0.625 V
Lead Channel Pacing Threshold Amplitude: 0.625 V
Lead Channel Pacing Threshold Pulse Width: 0.4 ms
Lead Channel Pacing Threshold Pulse Width: 0.4 ms
Lead Channel Sensing Intrinsic Amplitude: 1.25 mV
Lead Channel Sensing Intrinsic Amplitude: 1.25 mV
Lead Channel Sensing Intrinsic Amplitude: 20 mV
Lead Channel Sensing Intrinsic Amplitude: 20 mV
Lead Channel Setting Pacing Amplitude: 1.5 V
Lead Channel Setting Pacing Amplitude: 2.5 V
Lead Channel Setting Pacing Pulse Width: 0.4 ms
Lead Channel Setting Sensing Sensitivity: 4 mV
Zone Setting Status: 755011
Zone Setting Status: 755011

## 2022-09-25 NOTE — Progress Notes (Signed)
Remote pacemaker transmission.   

## 2022-10-16 ENCOUNTER — Ambulatory Visit: Payer: Medicare Other | Attending: Internal Medicine | Admitting: Internal Medicine

## 2022-10-16 ENCOUNTER — Encounter: Payer: Self-pay | Admitting: Internal Medicine

## 2022-10-16 VITALS — BP 128/76 | HR 100 | Ht 68.0 in | Wt 169.6 lb

## 2022-10-16 DIAGNOSIS — I442 Atrioventricular block, complete: Secondary | ICD-10-CM | POA: Diagnosis not present

## 2022-10-16 NOTE — Progress Notes (Signed)
HPI [name] returns today for ongoing evaluation and management of complete heart block, status post permanent pacemaker insertion. This all occurred in the setting of a second aortic valve placement. In the interim the patient has done well. We have not seen him in almost 2 years. He denies chest pain or shortness of breath. He admits to some dietary indiscretion with sodium. No syncope. He remains active playing tennis and mowing his grass. He notes that his BP is always higher in the doctors office.  Allergies  Allergen Reactions   Benazepril Other (See Comments)     Current Outpatient Medications  Medication Sig Dispense Refill   acetaminophen (TYLENOL) 500 MG tablet Take 500-1,000 mg by mouth every 6 (six) hours as needed (pain.).     allopurinol (ZYLOPRIM) 300 MG tablet Take 300 mg by mouth daily at 12 noon.      atorvastatin (LIPITOR) 10 MG tablet TAKE 1 TABLET BY MOUTH ONCE DAILY for 90 days     carvedilol (COREG) 6.25 MG tablet Take 1.5 tablets (9.375 mg total) by mouth 2 (two) times daily with a meal. 270 tablet 2   Cholecalciferol (VITAMIN D) 125 MCG (5000 UT) CAPS Take 5,000 Units by mouth every Tuesday, Thursday, and Saturday at 6 PM.     Coenzyme Q10 100 MG capsule Take 100 mg by mouth every evening.     Cyanocobalamin (VITAMIN B-12) 2500 MCG SUBL Place 2,500 mcg under the tongue 2 (two) times a week. Wednesdays & Saturdays in the morning     ELIQUIS 5 MG TABS tablet TAKE 1 TABLET BY MOUTH TWICE  DAILY 200 tablet 2   ezetimibe (ZETIA) 10 MG tablet Take 5 mg by mouth in the morning.     MAGNESIUM CITRATE PO Take 500 mg by mouth every evening.     sildenafil (VIAGRA) 100 MG tablet 1/2 to 1  tab 30 min. to 4 hours prior to sexual activity Orally     TART CHERRY PO Take 3,600 mg by mouth in the morning. 1200 mg/capsule     telmisartan (MICARDIS) 80 MG tablet Take 80 mg by mouth every evening.  11   No current facility-administered medications for this visit.      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   Past Surgical History:  Procedure Laterality Date   Aortic valve  1982   prosthetic  val ve   AORTIC VALVE REPLACEMENT N/A 11/21/2016   Procedure: REDO AORTIC VALVE REPLACEMENT (AVR);  Surgeon: Loreli Slot,  MD;  Location: [facility] OR;  Service: Open Heart Surgery;  Laterality: N/A;   CARDIAC CATHETERIZATION     CARDIOVERSION N/A 02/06/2022   Procedure: CARDIOVERSION;  Surgeon: Quintella Reichert, MD;  Location: [facility] ENDOSCOPY;  Service: Cardiovascular;  Laterality: N/A;   CORONARY ARTERY BYPASS GRAFT N/A 11/21/2016   Procedure: CORONARY ARTERY BYPASS GRAFTING (CABG) x 2, using right leg greater saphenous vein harvested endoscopically;  Surgeon: Loreli Slot, MD;  Location: [facility] OR;  Service: Open Heart Surgery;  Laterality: N/A;   cyst removed  1976   right side of neck   PACEMAKER IMPLANT N/A 11/26/2016   Procedure: Pacemaker Implant;  Surgeon: Duke Salvia, MD;  Location: [facility] INVASIVE CV LAB;  Service: Cardiovascular;  Laterality: N/A;   TEE WITHOUT CARDIOVERSION N/A 11/21/2016   Procedure:  TRANSESOPHAGEAL ECHOCARDIOGRAM (TEE);  Surgeon: Loreli Slot, MD;  Location: [facility] OR;  Service: Open Heart Surgery;  Laterality: N/A;   THORACIC AORTIC ANEURYSM REPAIR N/A 11/21/2016   Procedure: THORACIC ASCENDING ANEURYSM REPAIR (AAA);  Surgeon: Loreli Slot, MD;  Location: [facility] OR;  Service: Open Heart Surgery;  Laterality: N/A;   TONSILLECTOMY       Family History  Problem Relation Age of Onset   Hyperlipidemia Mother    Colon cancer Mother    Emphysema Father    Other Father        respiratory failure   Diabetes Maternal Grandfather    Colon polyps Neg Hx    Kidney disease Neg Hx    Esophageal cancer Neg Hx    Gallbladder disease Neg Hx      Social History   Socioeconomic History   Marital status: Married    Spouse name: Not on file   Number of children: 2   Years of education: Not on file   Highest education level: Not on file  Occupational History   Occupation: Retired  Tobacco Use   Smoking status: Never   Smokeless tobacco: Never  Vaping Use   Vaping Use: Never used  Substance and Sexual Activity   Alcohol use: Yes    Alcohol/week: 0.0 standard drinks of alcohol    Comment: 2 drinks a day   Drug use: No   Sexual activity: Not on file  Other Topics Concern   Not on file  Social History Narrative   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   Gym 1x wk   Yoga 1 x wk   Social Determinants of Corporate investment banker Strain: Not on file  Food Insecurity: Not on file  Transportation Needs: Not on file  Physical Activity: Not on file  Stress: Not on file  Social Connections: Not on file  Intimate Partner Violence: Not on file     Ht 5\' 8"  (1.727 m)   Wt 169 lb 9.6 oz (76.9 kg)   BMI 25.79 kg/m   Physical Exam:  Well appearing NAD HEENT: Unremarkable Neck:  No JVD, no thyromegally Lymphatics:  No adenopathy Back:  No CVA tenderness Lungs:  Clear with no wheezes HEART:  Regular rate rhythm, no  murmurs, no rubs, no clicks Abd:  soft, positive bowel sounds, no organomegally, no rebound, no guarding Ext:  2 plus pulses, no edema, no cyanosis, no clubbing Skin:  No rashes no nodules Neuro:  CN II through XII intact, motor grossly intact  EKG - nsr with ventricular pacing  DEVICE  Normal device function.  See PaceArt for details.   Assess/Plan: CHB - he is asymptomatic, s/p PPM insertion 2. PPM - his medtronic DDD PM is working normally. We will recheck in several months. 3. HTN - his blood pressure is well controlled. He will continue his current meds. 4. Dyslipidemia - Continue Zetia and lipitor   Zadin Lange,M.D

## 2022-10-16 NOTE — Patient Instructions (Signed)
Medication Instructions:  Your physician recommends that you continue on your current medications as directed. Please refer to the Current Medication list given to you today.  *If you need a refill on your cardiac medications before your next appointment, please call your pharmacy*  Lab Work: If you have labs (blood work) drawn today and your tests are completely normal, you will receive your results only by: MyChart Message (if you have MyChart) OR A paper copy in the mail If you have any lab test that is abnormal or we need to change your treatment, we will call you to review the results.  Testing/Procedures: None ordered today.  Follow-Up: At [facility], you and your health needs are our priority.  As part of our continuing mission to provide you with exceptional heart care, we have created designated Provider Care Teams.  These Care Teams include your primary Cardiologist (physician) and Advanced Practice Providers (APPs -  Physician Assistants and Nurse Practitioners) who all work together to provide you with the care you need, when you need it.  We recommend signing up for the patient portal called "MyChart".  Sign up information is provided on this After Visit Summary.  MyChart is used to connect with patients for Virtual Visits (Telemedicine).  Patients are able to view lab/test results, encounter notes, upcoming appointments, etc.  Non-urgent messages can be sent to your provider as well.   To learn more about what you can do with MyChart, go to [email or web address].    Your next appointment:   1 year(s)  Provider:   You may see Lewayne Bunting, MD or one of the following Advanced Practice Providers on your designated Care Team:   Francis Dowse, [place] "Mardelle Matte" [place], [place] Sherie Don, NP

## 2022-12-06 ENCOUNTER — Telehealth: Payer: Self-pay | Admitting: Internal Medicine

## 2022-12-06 NOTE — Telephone Encounter (Signed)
Patient called who advised he is receiving code 3230 MDT monitor. Advised patient to place handheld device on monitor for atleast 6 hours and retry tonight. Will forward to CMA pool for f/u

## 2022-12-06 NOTE — Telephone Encounter (Signed)
Patient is requesting to speak with the device team. Please advise.

## 2022-12-07 NOTE — Telephone Encounter (Signed)
Spoke with pt and we have ordered a new handheld and it will take 7-10 business days and pt is aware

## 2022-12-11 ENCOUNTER — Other Ambulatory Visit: Payer: Self-pay | Admitting: Cardiovascular Disease

## 2022-12-11 ENCOUNTER — Other Ambulatory Visit: Payer: Self-pay | Admitting: Thoracic Surgery (Cardiothoracic Vascular Surgery)

## 2022-12-11 DIAGNOSIS — I7121 Aneurysm of the ascending aorta, without rupture: Secondary | ICD-10-CM

## 2022-12-14 ENCOUNTER — Ambulatory Visit: Payer: Medicare Other

## 2022-12-14 DIAGNOSIS — I442 Atrioventricular block, complete: Secondary | ICD-10-CM | POA: Diagnosis not present

## 2022-12-14 LAB — CUP PACEART REMOTE DEVICE CHECK
Battery Remaining Longevity: 41 mo
Battery Voltage: 2.94 V
Brady Statistic AP VP Percent: 43.88 %
Brady Statistic AP VS Percent: 0 %
Brady Statistic AS VP Percent: 56.01 %
Brady Statistic AS VS Percent: 0.12 %
Brady Statistic RA Percent Paced: 43.35 %
Brady Statistic RV Percent Paced: 99.88 %
Date Time Interrogation Session: 20240816110401
Implantable Lead Connection Status: 753985
Implantable Lead Connection Status: 753985
Implantable Lead Implant Date: 20180730
Implantable Lead Implant Date: 20180730
Implantable Lead Location: 753859
Implantable Lead Location: 753860
Implantable Lead Model: 5076
Implantable Lead Model: 5076
Implantable Pulse Generator Implant Date: 20180730
Lead Channel Impedance Value: 323 Ohm
Lead Channel Impedance Value: 380 Ohm
Lead Channel Impedance Value: 494 Ohm
Lead Channel Impedance Value: 532 Ohm
Lead Channel Pacing Threshold Amplitude: 0.5 V
Lead Channel Pacing Threshold Amplitude: 0.625 V
Lead Channel Pacing Threshold Pulse Width: 0.4 ms
Lead Channel Pacing Threshold Pulse Width: 0.4 ms
Lead Channel Sensing Intrinsic Amplitude: 1.5 mV
Lead Channel Sensing Intrinsic Amplitude: 1.5 mV
Lead Channel Sensing Intrinsic Amplitude: 20 mV
Lead Channel Sensing Intrinsic Amplitude: 20 mV
Lead Channel Setting Pacing Amplitude: 1.5 V
Lead Channel Setting Pacing Amplitude: 2.5 V
Lead Channel Setting Pacing Pulse Width: 0.4 ms
Lead Channel Setting Sensing Sensitivity: 4 mV
Zone Setting Status: 755011
Zone Setting Status: 755011

## 2022-12-24 NOTE — Progress Notes (Signed)
Remote pacemaker transmission.   

## 2023-01-15 ENCOUNTER — Ambulatory Visit
Admission: RE | Admit: 2023-01-15 | Discharge: 2023-01-15 | Disposition: A | Discharge status: Home / Self Care | Payer: Medicare Other | Source: Ambulatory Visit | Attending: Thoracic Surgery (Cardiothoracic Vascular Surgery) | Admitting: Thoracic Surgery (Cardiothoracic Vascular Surgery)

## 2023-01-15 DIAGNOSIS — I7121 Aneurysm of the ascending aorta, without rupture: Secondary | ICD-10-CM

## 2023-01-22 ENCOUNTER — Encounter: Payer: Self-pay | Admitting: Thoracic Surgery (Cardiothoracic Vascular Surgery)

## 2023-01-22 ENCOUNTER — Ambulatory Visit: Payer: Medicare Other | Admitting: Thoracic Surgery (Cardiothoracic Vascular Surgery)

## 2023-01-22 VITALS — BP 169/66 | HR 65 | Resp 20 | Ht 68.0 in | Wt 165.0 lb

## 2023-01-22 DIAGNOSIS — I7121 Aneurysm of the ascending aorta, without rupture: Secondary | ICD-10-CM | POA: Diagnosis not present

## 2023-01-22 NOTE — Progress Notes (Signed)
[address]       Dakota Martin [address]             [phone]     HPI: [name] returns for a scheduled follow-up visit regarding ascending aortic ectasia.  Dakota Martin is an 82 year old gentleman with a history of aortic stenosis, Dakota Martin, AVR, ascending aneurysm, CAD, bio-Bentall repair with CABG x 2 in 2018, complete heart block requiring permanent pacemaker, hypertension, hyperlipidemia, aortic atherosclerosis, and atrial fibrillation.  He had an AVR with Martin valve in 1982.  He developed an enlarging aortic root.  I did a Bio-Bentall procedure in 2018.  He has some residual mildly ectatic ascending aorta that we have been following since then.  I saw him in the office a year ago.  The aorta was stable about 3.9 cm.  In the interim since his last visit he has been feeling well.  No chest pain, pressure, or tightness. Checks his blood pressure at home and has been in the normal range.  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     allopurinol (ZYLOPRIM) 300 MG tablet Take 300 mg by mouth daily at 12 noon.      atorvastatin (LIPITOR) 10 MG tablet TAKE 1 TABLET BY MOUTH ONCE DAILY for 90 days     carvedilol (COREG) 6.25 MG tablet TAKE 1 AND 1/2 TABLETS BY MOUTH  TWICE DAILY WITH MEALS 300 tablet 1   cetirizine (ZYRTEC) 10 MG tablet Take 10 mg by mouth daily.     Cholecalciferol (VITAMIN D) 125 MCG (5000 UT) CAPS Take 5,000 Units by mouth every Tuesday, Thursday, and Saturday at 6 PM.     Coenzyme Q10 100 MG capsule Take 100 mg by mouth every evening.     Cyanocobalamin (VITAMIN B-12) 2500 MCG SUBL Place 2,500 mcg under the tongue 2 (two) times a week. Wednesdays & Saturdays in the morning     diphenhydrAMINE (SOMINEX) 25 MG tablet Take 25 mg by mouth at bedtime as needed for sleep.     ELIQUIS 5 MG TABS tablet TAKE 1 TABLET BY MOUTH TWICE  DAILY 200 tablet 2   ezetimibe (ZETIA) 10 MG tablet Take 5 mg by mouth in the morning.     MAGNESIUM CITRATE PO Take 500 mg by mouth every evening.     Omega-3 Fatty Acids (OMEGA 3 500 PO) Take 1 tablet by mouth daily.  sildenafil (VIAGRA) 100 MG tablet 1/2 to 1  tab 30 min. to 4 hours prior to sexual activity Orally     telmisartan (MICARDIS) 80 MG tablet Take 80 mg by mouth every evening.  11   Turmeric (QC TUMERIC COMPLEX PO) Take 500 mg by mouth in the morning and at bedtime.     No current facility-administered medications for this visit.    Physical Exam BP (!) 169/66 (BP Location: Left Arm, Patient Position: Sitting, Cuff Size: Normal)   Pulse 65   Resp 20   Ht 5\' 8"  (1.727 m)   Wt 165 lb (74.8 kg)   SpO2 98% Comment: RA  BMI 25.35 kg/m  82 year old man in no acute distress Alert and oriented x 3 with no focal deficits Lungs clear bilaterally Cardiac regular rate and rhythm with a normal S1 and S2, no murmur No peripheral edema No carotid bruits  Diagnostic Tests: CT CHEST WITHOUT CONTRAST   TECHNIQUE: Multidetector CT imaging of  the chest was performed following the standard protocol without IV contrast.   RADIATION DOSE REDUCTION: This exam was performed according to the departmental dose-optimization program which includes automated exposure control, adjustment of the mA and/or kV according to patient size and/or use of iterative reconstruction technique.   COMPARISON:  Multiple priors, most recent January 11, 2022   FINDINGS: Cardiovascular: Prior aortic valve replacement and graft repair of the ascending thoracic aorta. Stable mildly dilated upper ascending thoracic aorta measuring 4.0 x 3.9 cm. Severe atherosclerotic disease of the thoracic aorta. Severe coronary artery calcifications status post CABG.   Mediastinum/Nodes: Esophagus and thyroid are unremarkable. No enlarged lymph nodes seen in the chest.   Lungs/Pleura: Central airways are patent. No consolidation, pleural effusion or pneumothorax.   Upper Abdomen: Numerous scattered low-attenuation liver lesions, unchanged when compared with the prior exam and likely simple cysts. Splenic calcifications, likely sequela of prior granulomatous infection.   Musculoskeletal: Prior median sternotomy. No aggressive appearing osseous lesions.   IMPRESSION: 1. Prior aortic valve replacement and graft repair of the ascending thoracic aorta. 2. Stable mildly dilated upper ascending thoracic aorta chest distal to the graft, measuring up to 4.0 cm. 3. Coronary artery calcifications status post CABG. 4. Aortic Atherosclerosis (ICD10-I70.0).     Electronically Signed   By: Dakota Martin M.D.   On: 01/15/2023 17:22 I personally reviewed the CT images.  Status post prior aortic root replacement, coronary calcification status post CABG, distal ascending aortic ectasia unchanged, aortic atherosclerosis.  Impression: Dakota Martin is an 82 year old gentleman with a history of aortic stenosis, Dakota Martin, AVR, ascending aneurysm, CAD,  bio-Bentall repair with CABG x 2 in 2018, complete heart block requiring permanent pacemaker, hypertension, hyperlipidemia, aortic atherosclerosis, and atrial fibrillation.  Ascending aortic ectasia-stable.  Unlikely to cause him problems but given his history I think we need to continue to follow that.  Coronary atherosclerosis-status post CABG x 2.  No anginal symptoms.  Hypertension-has whitecoat hypertension.  He is diligent about checking himself at home and has been within the normal range.  Plan: Return in 1 year with noncontrast CT (aortic dimensions easily visible due to calcification of the wall)  I spent 20 minutes in review of records, images, and in consultation with Dakota Martin today. Loreli Slot, MD [facility] [phone]

## 2023-03-15 ENCOUNTER — Ambulatory Visit (INDEPENDENT_AMBULATORY_CARE_PROVIDER_SITE_OTHER): Payer: Medicare Other

## 2023-03-15 DIAGNOSIS — I442 Atrioventricular block, complete: Secondary | ICD-10-CM

## 2023-03-16 LAB — CUP PACEART REMOTE DEVICE CHECK
Battery Remaining Longevity: 37 mo
Battery Voltage: 2.93 V
Brady Statistic AP VP Percent: 45.2 %
Brady Statistic AP VS Percent: 0 %
Brady Statistic AS VP Percent: 54.66 %
Brady Statistic AS VS Percent: 0.14 %
Brady Statistic RA Percent Paced: 44.67 %
Brady Statistic RV Percent Paced: 99.86 %
Date Time Interrogation Session: 20241115000427
Implantable Lead Connection Status: 753985
Implantable Lead Connection Status: 753985
Implantable Lead Implant Date: 20180730
Implantable Lead Implant Date: 20180730
Implantable Lead Location: 753859
Implantable Lead Location: 753860
Implantable Lead Model: 5076
Implantable Lead Model: 5076
Implantable Pulse Generator Implant Date: 20180730
Lead Channel Impedance Value: 304 Ohm
Lead Channel Impedance Value: 399 Ohm
Lead Channel Impedance Value: 456 Ohm
Lead Channel Impedance Value: 494 Ohm
Lead Channel Pacing Threshold Amplitude: 0.5 V
Lead Channel Pacing Threshold Amplitude: 0.625 V
Lead Channel Pacing Threshold Pulse Width: 0.4 ms
Lead Channel Pacing Threshold Pulse Width: 0.4 ms
Lead Channel Sensing Intrinsic Amplitude: 1.25 mV
Lead Channel Sensing Intrinsic Amplitude: 1.25 mV
Lead Channel Sensing Intrinsic Amplitude: 20 mV
Lead Channel Sensing Intrinsic Amplitude: 20 mV
Lead Channel Setting Pacing Amplitude: 1.5 V
Lead Channel Setting Pacing Amplitude: 2.5 V
Lead Channel Setting Pacing Pulse Width: 0.4 ms
Lead Channel Setting Sensing Sensitivity: 4 mV
Zone Setting Status: 755011
Zone Setting Status: 755011

## 2023-03-27 NOTE — Progress Notes (Signed)
Remote pacemaker transmission.   

## 2023-04-18 ENCOUNTER — Ambulatory Visit: Payer: Medicare Other | Admitting: Gastroenterology

## 2023-04-18 ENCOUNTER — Encounter: Payer: Self-pay | Admitting: Gastroenterology

## 2023-04-18 ENCOUNTER — Telehealth: Payer: Self-pay

## 2023-04-18 VITALS — BP 108/60 | HR 60 | Ht 68.0 in | Wt 168.1 lb

## 2023-04-18 DIAGNOSIS — Z860100 Personal history of colon polyps, unspecified: Secondary | ICD-10-CM

## 2023-04-18 DIAGNOSIS — Z7901 Long term (current) use of anticoagulants: Secondary | ICD-10-CM | POA: Diagnosis not present

## 2023-04-18 DIAGNOSIS — Z860101 Personal history of adenomatous and serrated colon polyps: Secondary | ICD-10-CM

## 2023-04-18 DIAGNOSIS — Z09 Encounter for follow-up examination after completed treatment for conditions other than malignant neoplasm: Secondary | ICD-10-CM | POA: Diagnosis not present

## 2023-04-18 MED ORDER — NA SULFATE-K SULFATE-MG SULF 17.5-3.13-1.6 GM/177ML PO SOLN: 1.0000 | Freq: Once | ORAL | 0 refills | Status: AC

## 2023-04-18 NOTE — Telephone Encounter (Signed)
[facility] Pre-operative Risk Assessment     Request for surgical clearance:     Endoscopy Procedure  What type of surgery is being performed?     COLONOSCOPY  When is this surgery scheduled?     06-10-23  What type of clearance is required ?   Pharmacy  Are there any medications that need to be held prior to surgery and how long? ELIQUIS 2 DAY  Practice name and name of physician performing surgery?   [facility]  What is your office phone and fax number?      Phone- [phone]  Fax- [phone]  ATTN: Lucius Conn, CMA  Anesthesia type (None, local, [name], general) ?       [name]   Please route your response to [facility], CMA

## 2023-04-18 NOTE — Telephone Encounter (Signed)
Pharmacy please advise on holding Eliquis prior to colonoscopy scheduled for 06/10/2023. Thank you.

## 2023-04-18 NOTE — Progress Notes (Signed)
HPI :  82 year old male with a history of aortic valve replacement with redo in 2018, history of colon polyps, aortic aneurysm, here to reestablish care to discuss surveillance colonoscopy.  He was last seen in August 2021.  Recall that he had a colonoscopy in 2016 where he had an advanced adenoma removed.  His last colonoscopy was with me in August 2021, he had 6 small adenomas removed.  He is here for consideration of another colonoscopy.  He takes Metamucil regularly, states it works well to keep his bowels moving, he has constipation at baseline if he does not take anything.  Denies any blood in his stools.  No abdominal pains.  He feels well in general, denies any cardiopulmonary symptoms.  We reviewed his medical history together.  He denies any problems currently with his heart.  He had A-fib about a year ago and was placed on Eliquis.  He has had no issues with bleeding, seems to tolerate it well.  He does have a pacemaker in place.  Denies any shortness of breath or chest pains.  His preference is to proceed with colonoscopy and we discussed this for a bit today.  Prior workup: Colonoscopy 07/13/14 - A semi-pedunculated polyp measuring 10 mm in size was found in the ascending colon. A polypectomy was performed using snare cautery. The resection was complete, the polyp tissue was completely retrieved and sent to histology. Retroflexed views revealed no abnormalities. The time to cecum = 5.46 Withdrawal time = 9.32 The scope was withdrawn and the procedure completed.   Surgical [P], ascending, polyp - TUBULAR ADENOMA. NO HIGH GRADE DYSPLASIA OR MALIGNANCY IDENTIFIED.     Echo 11/25/2016 - [name]%   Colonoscopy 12/04/19: - The perianal and digital rectal examinations were normal. - A 3 to 4 mm polyp was found in the cecum. The polyp was sessile. The polyp was removed with a cold snare. Resection and retrieval were complete. - Two sessile polyps were found in the ascending colon. The  polyps were 3 mm in size. These polyps were removed with a cold snare. Resection and retrieval were complete. - A 3 mm polyp was found in the transverse colon. The polyp was sessile. The polyp was removed with a cold snare. Resection and retrieval were complete. - Two sessile polyps were found in the sigmoid colon. The polyps were 4 mm in size. These polyps were removed with a cold snare. Resection and retrieval were complete. - Internal hemorrhoids were found during retroflexion. - The exam was otherwise without abnormality.  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 // [facility] mechanical valve done  in 1982 // Echo 09/10/16: Mild focal basal septal hypertrophy, [name], [name], [name], mod aortic root enlargement, aortic root 36 mm, ascending aorta 49 mm, [name], [name]     Past Surgical History:  Procedure Laterality Date   Aortic valve  1982   prosthetic  val ve   AORTIC VALVE REPLACEMENT N/A 11/21/2016   Procedure: REDO AORTIC VALVE REPLACEMENT (AVR);  Surgeon: Loreli Slot, MD;  Location: [facility] OR;  Service: Open Heart Surgery;  Laterality: N/A;   CARDIAC  CATHETERIZATION     CARDIOVERSION N/A 02/06/2022   Procedure: CARDIOVERSION;  Surgeon: Quintella Reichert, MD;  Location: [facility] ENDOSCOPY;  Service: Cardiovascular;  Laterality: N/A;   CORONARY ARTERY BYPASS GRAFT N/A 11/21/2016   Procedure: CORONARY ARTERY BYPASS GRAFTING (CABG) x 2, using right leg greater saphenous vein harvested endoscopically;  Surgeon: Loreli Slot, MD;  Location: [facility] OR;  Service: Open Heart Surgery;  Laterality: N/A;   cyst removed  1976   right side of neck   PACEMAKER IMPLANT N/A 11/26/2016   Procedure: Pacemaker Implant;  Surgeon: Duke Salvia, MD;  Location: [facility] INVASIVE CV LAB;  Service: Cardiovascular;  Laterality: N/A;   TEE WITHOUT CARDIOVERSION N/A 11/21/2016   Procedure: TRANSESOPHAGEAL ECHOCARDIOGRAM (TEE);  Surgeon: Loreli Slot, MD;  Location: [facility] OR;  Service: Open Heart Surgery;  Laterality: N/A;   THORACIC AORTIC ANEURYSM REPAIR N/A 11/21/2016   Procedure: THORACIC ASCENDING ANEURYSM REPAIR (AAA);  Surgeon: Loreli Slot, MD;  Location: [facility] OR;  Service: Open Heart Surgery;  Laterality: N/A;   TONSILLECTOMY     Family History  Problem Relation Age of Onset   Hyperlipidemia Mother    Colon cancer Mother    Emphysema Father    Other Father        respiratory failure   Diabetes Maternal Grandfather    Colon polyps Neg Hx    Kidney disease Neg Hx    Esophageal cancer Neg Hx    Gallbladder disease Neg Hx    Social History   Tobacco Use   Smoking status: Never   Smokeless tobacco: Never  Vaping Use   Vaping status: Never Used  Substance Use Topics   Alcohol use: Yes    Alcohol/week: 0.0 standard drinks of alcohol    Comment: 2 drinks a day   Drug use: No   Current Outpatient Medications  Medication Sig Dispense Refill   acetaminophen (TYLENOL) 500 MG tablet Take 500-1,000 mg by mouth every 6 (six) hours as needed (pain.).     allopurinol (ZYLOPRIM) 300 MG tablet Take 300 mg by mouth daily at 12 noon.      atorvastatin  (LIPITOR) 10 MG tablet TAKE 1 TABLET BY MOUTH ONCE DAILY for 90 days     carvedilol (COREG) 6.25 MG tablet TAKE 1 AND 1/2 TABLETS BY MOUTH  TWICE DAILY WITH MEALS 300 tablet 1   cetirizine (ZYRTEC) 10 MG tablet Take 10 mg by mouth daily.     ELIQUIS 5 MG TABS tablet TAKE 1 TABLET BY MOUTH TWICE  DAILY 200 tablet 2   ezetimibe (ZETIA) 10 MG tablet Take 5 mg by mouth in the morning.     sildenafil (VIAGRA) 100 MG tablet 1/2 to 1  tab 30 min. to 4 hours prior to sexual activity Orally     telmisartan (MICARDIS) 80 MG tablet Take 80 mg by mouth every evening.  11   Cholecalciferol (VITAMIN D) 125 MCG (5000  UT) CAPS Take 5,000 Units by mouth every Tuesday, Thursday, and Saturday at 6 PM. (Patient not taking: Reported on 04/18/2023)     Coenzyme Q10 100 MG capsule Take 100 mg by mouth every evening. (Patient not taking: Reported on 04/18/2023)     Cyanocobalamin (VITAMIN B-12) 2500 MCG SUBL Place 2,500 mcg under the tongue 2 (two) times a week. Wednesdays & Saturdays in the morning (Patient not taking: Reported on 04/18/2023)     MAGNESIUM CITRATE PO Take 500 mg by mouth every evening. (Patient not taking: Reported on 04/18/2023)     Omega-3 Fatty Acids (OMEGA 3 500 PO) Take 1 tablet by mouth daily. (Patient not taking: Reported on 04/18/2023)     Turmeric (QC TUMERIC COMPLEX PO) Take 500 mg by mouth in the morning and at bedtime. (Patient not taking: Reported on 04/18/2023)     No current facility-administered medications for this visit.   Allergies  Allergen Reactions   Benazepril Other (See Comments)   Turmeric Other (See Comments)     Review of Systems: All systems reviewed and negative except where noted in HPI.   Lab Results  Component Value Date   WBC 7.9 01/24/2022   HGB 13.5 01/24/2022   HCT 41.8 01/24/2022   MCV 100.7 (H) 01/24/2022   PLT 138 (L) 01/24/2022    Lab Results  Component Value Date   NA 138 01/24/2022   CL 103 01/24/2022   K 4.6 01/24/2022   CO2 24 01/24/2022    BUN 21 01/24/2022   CREATININE 1.20 01/24/2022   GFRNONAA >60 01/24/2022   CALCIUM 9.6 01/24/2022   ALBUMIN 4.2 01/24/2022   GLUCOSE 109 (H) 01/24/2022    Lab Results  Component Value Date   ALT 36 01/24/2022   AST 30 01/24/2022   ALKPHOS 68 01/24/2022   BILITOT 1.1 01/24/2022     Physical Exam: BP 108/60   Pulse 60   Ht 5\' 8"  (1.727 m)   Wt 168 lb 2 oz (76.3 kg)   SpO2 99%   BMI 25.56 kg/m  Constitutional: Pleasant,well-developed, male in no acute distress. HEENT: Normocephalic and atraumatic. Conjunctivae are normal. No scleral icterus. Neck supple.  Cardiovascular: Normal rate, regular rhythm.  Pulmonary/chest: Effort normal and breath sounds normal. Abdominal: Soft, nondistended, nontender. There are no masses palpable. Extremities: no edema Lymphadenopathy: No cervical adenopathy noted. Neurological: Alert and oriented to person place and time. Skin: Skin is warm and dry. No rashes noted. Psychiatric: Normal mood and affect. Behavior is normal.   ASSESSMENT: 82 y.o. male here for assessment of the following  1. History of colon polyps   2. Anticoagulated    Medical history as outlined above.  He currently has no symptoms that bother him either from his GI tract or a cardiopulmonary standpoint.  He does have a history of advanced adenoma in the past and had 6 adenomas over 3 years ago.  We discussed if you want to pursue having another colonoscopy given his age.  He inquires about Cologuard, he is not a candidate for that given his history of polyps.  I discussed colonoscopy with him, risks and benefits of the procedure and anesthesia.  If we were to proceed with this he would need to hold Eliquis for 2 days prior to the exam.  Following a good discussion of the risks of the procedure and the risks of not doing the procedure, his preference is to proceed with colonoscopy as he is otherwise feeling well and would be anxious  if he stopped surveillance at this point  in his life.  I offered him the exam in this light.  If there are no high risk lesions on this exam, this will likely be his last colonoscopy.  He understands that and agrees.  PLAN: - schedule colonoscopy at [facility] - discussed options, he is not a candidate for Cologuard and think reasonable to do one more exam following discussion of risks / benefits.  - will need to ask Dr. Ladona Ridgel if okay to hold Eliquis for 2 days prior to the exam  Harlin Rain, MD [facility]  CC: Rodrigo Ran, MD

## 2023-04-18 NOTE — Patient Instructions (Addendum)
You have been scheduled for a colonoscopy. Please follow written instructions given to you at your visit today.   Please pick up your prep supplies at the pharmacy within the next 1-3 days.  If you use inhalers (even only as needed), please bring them with you on the day of your procedure.  DO NOT TAKE 7 DAYS PRIOR TO TEST- Trulicity (dulaglutide) Ozempic, Wegovy (semaglutide) Mounjaro (tirzepatide) Bydureon Bcise (exanatide extended release)  DO NOT TAKE 1 DAY PRIOR TO YOUR TEST Rybelsus (semaglutide) Adlyxin (lixisenatide) Victoza (liraglutide) Byetta (exanatide) ___________________________________________________________________________  Bonita Quin will be contacted by our office prior to your procedure for directions on holding your ELIQUIS.  If you do not hear from our office 1 week prior to your scheduled procedure, please call [phone] to discuss.   Thank you for entrusting me with your care and for choosing [facility], Dr. Ileene Patrick   If your blood pressure at your visit was 140/90 or greater, please contact your primary care physician to follow up on this. ______________________________________________________  If you are age 64 or older, your body mass index should be between 23-30. Your Body mass index is 25.56 kg/m. If this is out of the aforementioned range listed, please consider follow up with your Primary Care Provider.  If you are age 55 or younger, your body mass index should be between 19-25. Your Body mass index is 25.56 kg/m. If this is out of the aformentioned range listed, please consider follow up with your Primary Care Provider.  ________________________________________________________  The [facility] providers would like to encourage you to use [facility] to communicate with providers for non-urgent requests or questions.  Due to long hold times on the telephone, sending your provider a message by [facility] may be a faster and more efficient way to  get a response.  Please allow 48 business hours for a response.  Please remember that this is for non-urgent requests.  _______________________________________________________  Due to recent changes in healthcare laws, you may see the results of your imaging and laboratory studies on MyChart before your provider has had a chance to review them.  We understand that in some cases there may be results that are confusing or concerning to you. Not all laboratory results come back in the same time frame and the provider may be waiting for multiple results in order to interpret others.  Please give [place] 48 hours in order for your provider to thoroughly review all the results before contacting the office for clarification of your results.

## 2023-04-19 ENCOUNTER — Telehealth: Payer: Self-pay | Admitting: *Deleted

## 2023-04-19 NOTE — Telephone Encounter (Signed)
Pt has been scheduled for tele visit, 05/06/23 2:40, consent on file / medications reconciled.

## 2023-04-19 NOTE — Telephone Encounter (Signed)
   Name: Dakota Martin  DOB: 17-Jan-1941  MRN: [id number]  Primary Cardiologist: Kristeen Miss, MD Procedure: Colonoscopy on 05/20/23   Preoperative team, please contact this patient and set up a phone call appointment for further preoperative risk assessment. Please obtain consent and complete medication review. Thank you for your help.  I confirm that guidance regarding antiplatelet and oral anticoagulation therapy has been completed and, if necessary, noted below.  Patient with diagnosis of atrial fibrillation on Eliquis for anticoagulation.   CHA2DS2-VASc Score = 4   This indicates a 4.8% annual risk of stroke. The patient's score is based upon: CHF History: 0 HTN History: 1 Diabetes History: 0 Stroke History: 0 Vascular Disease History: 1 Age Score: 2 Gender Score: 0     CrCl 55 Platelet count 153   Per office protocol, patient can hold Eliquis for 2 days prior to procedure.   Patient will not need bridging with Lovenox (enoxaparin) around procedure.   I also confirmed the patient resides in the state of [place]. As per [facility] Medical Board telemedicine laws, the patient must reside in the state in which the provider is licensed.   Rip Harbour, NP 04/19/2023, 9:46 AM [facility]

## 2023-04-19 NOTE — Telephone Encounter (Signed)
Pt has been scheduled for tele visit, 05/06/23 2:40, consent on file / medications reconciled.    Patient Consent for Virtual Visit        Dakota Martin has provided verbal consent on 04/19/2023 for a virtual visit (video or telephone).   CONSENT FOR VIRTUAL VISIT FOR:  Dakota Martin  By participating in this virtual visit I agree to the following:  I hereby voluntarily request, consent and authorize [facility] and its employed or contracted physicians, physician assistants, nurse practitioners or other licensed health care professionals (the Practitioner), to provide me with telemedicine health care services (the "Services") as deemed necessary by the treating Practitioner. I acknowledge and consent to receive the Services by the Practitioner via telemedicine. I understand that the telemedicine visit will involve communicating with the Practitioner through live audiovisual communication technology and the disclosure of certain medical information by electronic transmission. I acknowledge that I have been given the opportunity to request an in-person assessment or other available alternative prior to the telemedicine visit and am voluntarily participating in the telemedicine visit.  I understand that I have the right to withhold or withdraw my consent to the use of telemedicine in the course of my care at any time, without affecting my right to future care or treatment, and that the Practitioner or I may terminate the telemedicine visit at any time. I understand that I have the right to inspect all information obtained and/or recorded in the course of the telemedicine visit and may receive copies of available information for a reasonable fee.  I understand that some of the potential risks of receiving the Services via telemedicine include:  Delay or interruption in medical evaluation due to technological equipment failure or disruption; Information transmitted may not  be sufficient (e.g. poor resolution of images) to allow for appropriate medical decision making by the Practitioner; and/or  In rare instances, security protocols could fail, causing a breach of personal health information.  Furthermore, I acknowledge that it is my responsibility to provide information about my medical history, conditions and care that is complete and accurate to the best of my ability. I acknowledge that Practitioner's advice, recommendations, and/or decision may be based on factors not within their control, such as incomplete or inaccurate data provided by me or distortions of diagnostic images or specimens that may result from electronic transmissions. I understand that the practice of medicine is not an exact science and that Practitioner makes no warranties or guarantees regarding treatment outcomes. I acknowledge that a copy of this consent can be made available to me via my patient portal [facility] MyChart), or I can request a printed copy by calling the office of [facility].    I understand that my insurance will be billed for this visit.   I have read or had this consent read to me. I understand the contents of this consent, which adequately explains the benefits and risks of the Services being provided via telemedicine.  I have been provided ample opportunity to ask questions regarding this consent and the Services and have had my questions answered to my satisfaction. I give my informed consent for the services to be provided through the use of telemedicine in my medical care

## 2023-04-19 NOTE — Telephone Encounter (Signed)
Patient with diagnosis of atrial fibrillation on Eliquis for anticoagulation.    Procedure: colonoscopy Date of procedure: 05/20/23   CHA2DS2-VASc Score = 4   This indicates a 4.8% annual risk of stroke. The patient's score is based upon: CHF History: 0 HTN History: 1 Diabetes History: 0 Stroke History: 0 Vascular Disease History: 1 Age Score: 2 Gender Score: 0    CrCl 55 Platelet count 153  Per office protocol, patient can hold Eliquis for 2 days prior to procedure.   Patient will not need bridging with Lovenox (enoxaparin) around procedure.  **This guidance is not considered finalized until pre-operative APP has relayed final recommendations.**

## 2023-04-25 NOTE — Telephone Encounter (Signed)
Called patient. Let him know he can hold Eliquis on 2-8 and 2-9 barring any issues that arise on his tele visit on 1-6. Patient confirmed appointment on 1-6 with Cardiology

## 2023-05-06 ENCOUNTER — Other Ambulatory Visit: Payer: Self-pay | Admitting: Medical Genetics

## 2023-05-06 ENCOUNTER — Ambulatory Visit: Payer: Medicare Other | Attending: Nurse Practitioner | Admitting: Nurse Practitioner

## 2023-05-06 ENCOUNTER — Encounter: Payer: Self-pay | Admitting: Nurse Practitioner

## 2023-05-06 DIAGNOSIS — Z01810 Encounter for preprocedural cardiovascular examination: Secondary | ICD-10-CM | POA: Diagnosis not present

## 2023-05-06 NOTE — Progress Notes (Signed)
 Virtual Visit via Telephone Note   Because of Dakota Martin's co-morbid illnesses, he is at least at moderate risk for complications without adequate follow up.  This format is felt to be most appropriate for this patient at this time.  The patient did not have access to video technology/had technical difficulties with video requiring transitioning to audio format only (telephone).  All issues noted in this document were discussed and addressed.  No physical exam could be performed with this format.  Please refer to the patient's chart for his consent to telehealth for [facility].  Evaluation Performed:  Preoperative cardiovascular risk assessment _____________   Date:  05/06/2023   Patient ID:  Dakota Martin, DOB 04-Feb-1941, MRN [id number] Patient Location:  Home Provider location:   Office  Primary Care Provider:  Shayne Anes, MD Primary Cardiologist:  Aleene Passe, MD  Chief Complaint / Patient Profile   83 y.o. y/o male with a h/o CHB s/p PPM implant, aortic valve replacement in 1982 with redo AVR and repair of ascending aortic aneurysm and CABG x 29 October 2016, PAF on chronic anticoagulation, HTN  who is pending colonoscopy on 06/09/22 and presents today for telephonic preoperative cardiovascular risk assessment.  History of Present Illness    Dakota Martin is a 83 y.o. male who presents via audio/video conferencing for a telehealth visit today.  Pt was last seen in cardiology clinic on 10/16/22 by Dr. Waddell.  At that time Nael Petrosyan was doing well.  The patient is now pending procedure as outlined above. Since his last visit, he  denies chest pain, shortness of breath, lower extremity edema, fatigue, palpitations, melena, hematuria, hemoptysis, diaphoresis, weakness, presyncope, syncope, orthopnea, and PND. He remains active with playing tennis and yard work regularly and additionally going to the gym at least once weekly. He does  not have any concerning cardiac symptoms.   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 // Laree Shiley mechanical valve done in 1982 // Echo 09/10/16: Mild focal basal septal hypertrophy, [name], [name], [name], mod aortic root enlargement, aortic root 36 mm, ascending aorta 49 mm, [name], [name]   Past Surgical History:  Procedure Laterality Date   Aortic valve  1982   prosthetic  val ve   AORTIC VALVE REPLACEMENT N/A 11/21/2016   Procedure: REDO AORTIC VALVE REPLACEMENT (AVR);  Surgeon: Kerrin Elspeth BROCKS, MD;  Location: [facility] OR;  Service: Open Heart Surgery;  Laterality: N/A;   CARDIAC CATHETERIZATION     CARDIOVERSION N/A 02/06/2022   Procedure: CARDIOVERSION;  Surgeon: Shlomo Wilbert SAUNDERS, MD;  Location: [facility] ENDOSCOPY;  Service: Cardiovascular;  Laterality: N/A;   CORONARY ARTERY BYPASS GRAFT N/A 11/21/2016  Procedure: CORONARY ARTERY BYPASS GRAFTING (CABG) x 2, using right leg greater saphenous vein harvested endoscopically;  Surgeon: Kerrin Elspeth BROCKS, MD;  Location: [facility] OR;  Service: Open Heart Surgery;  Laterality: N/A;   cyst removed  1976   right side of neck   PACEMAKER IMPLANT N/A 11/26/2016   Procedure: Pacemaker Implant;  Surgeon: Fernande Elspeth BROCKS, MD;  Location: [facility] INVASIVE CV LAB;  Service:  Cardiovascular;  Laterality: N/A;   TEE WITHOUT CARDIOVERSION N/A 11/21/2016   Procedure: TRANSESOPHAGEAL ECHOCARDIOGRAM (TEE);  Surgeon: Kerrin Elspeth BROCKS, MD;  Location: [facility] OR;  Service: Open Heart Surgery;  Laterality: N/A;   THORACIC AORTIC ANEURYSM REPAIR N/A 11/21/2016   Procedure: THORACIC ASCENDING ANEURYSM REPAIR (AAA);  Surgeon: Kerrin Elspeth BROCKS, MD;  Location: [facility] OR;  Service: Open Heart Surgery;  Laterality: N/A;   TONSILLECTOMY      Allergies  Allergies  Allergen Reactions   Benazepril Other (See Comments)   Turmeric Other (See Comments)    Home Medications    Prior to Admission medications   Medication Sig Start Date End Date Taking? Authorizing Provider  acetaminophen  (TYLENOL ) 500 MG tablet Take 500-1,000 mg by mouth every 6 (six) hours as needed (pain.).    [provider]  allopurinol  (ZYLOPRIM ) 300 MG tablet Take 300 mg by mouth daily at 12 noon.     [provider]  atorvastatin  (LIPITOR) 10 MG tablet TAKE 1 TABLET BY MOUTH ONCE DAILY for 90 days    [provider]  carvedilol  (COREG ) 6.25 MG tablet TAKE 1 AND 1/2 TABLETS BY MOUTH  TWICE DAILY WITH MEALS 12/11/22   Nahser, Aleene PARAS, MD  cetirizine (ZYRTEC) 10 MG tablet Take 10 mg by mouth daily.    [provider]  Cholecalciferol (VITAMIN D) 125 MCG (5000 UT) CAPS Take 5,000 Units by mouth every Tuesday, Thursday, and Saturday at 6 PM.    [provider]  Coenzyme Q10 100 MG capsule Take 100 mg by mouth every evening.    [provider]  Cyanocobalamin (VITAMIN B-12) 2500 MCG SUBL Place 2,500 mcg under the tongue 2 (two) times a week. Wednesdays & Saturdays in the morning    [provider]  ELIQUIS  5 MG TABS tablet TAKE 1 TABLET BY MOUTH TWICE  DAILY 04/03/22   Carroll, Donna C, NP  ezetimibe  (ZETIA ) 10 MG tablet Take 5 mg by mouth in the morning.    [provider]  MAGNESIUM  CITRATE PO Take 500 mg by mouth every evening.    [provider]  Omega-3 Fatty Acids (OMEGA 3 500 PO) Take 1 tablet by mouth daily.    [provider]  sildenafil (VIAGRA) 100 MG tablet 1/2 to 1  tab 30 min. to 4 hours prior to sexual activity Orally 07/10/21   [provider]  telmisartan (MICARDIS) 80 MG tablet Take 80 mg by mouth every evening. 04/12/14   [provider]  Turmeric (QC TUMERIC COMPLEX PO) Take 500 mg by mouth in the morning and at bedtime.    [provider]    Physical Exam    Vital Signs:  Debby Shad Vanbeek does not have vital signs available for review today.  Given telephonic nature of communication, physical exam is limited. AAOx3. NAD. Normal affect.  Speech and respirations are unlabored.  Accessory Clinical Findings    None  Assessment & Plan    1.  Preoperative Cardiovascular Risk Assessment: According to the Revised Cardiac Risk Index (RCRI), his Perioperative  Risk of Major Cardiac Event is (%): 0.9. His Functional Capacity in METs is: 9.25 according to the Duke Activity Status Index (DASI). The patient is doing well from a cardiac perspective. Therefore, based on ACC/AHA guidelines, the patient would be at acceptable risk for the planned procedure without further cardiovascular testing.   The patient was advised that if he develops new symptoms prior to surgery to contact our office to arrange for a follow-up visit, and he verbalized understanding.  Per office protocol, patient can hold Eliquis  for 2 days prior to procedure.   Patient will not need bridging with Lovenox (enoxaparin) around procedure.  A copy of this note will be routed to requesting surgeon.  Time:   Today, I have spent 10 minutes with the patient with telehealth technology discussing medical history, symptoms, and management plan.     Rosaline EMERSON Bane, NP-C  05/06/2023, 2:40 PM [address] Office [phone] Fax [phone]

## 2023-05-06 NOTE — Telephone Encounter (Signed)
 Per OV on 1-6, ok for patient to hold Eliquis for 2 days prior to procedure.  Patient will not need bridging with Lovenox.

## 2023-05-07 ENCOUNTER — Encounter: Payer: Self-pay | Admitting: Internal Medicine

## 2023-05-07 ENCOUNTER — Other Ambulatory Visit (HOSPITAL_COMMUNITY)
Admission: RE | Admit: 2023-05-07 | Discharge: 2023-05-07 | Disposition: A | Discharge status: Home / Self Care | Payer: Self-pay | Source: Ambulatory Visit | Attending: Medical Genetics | Admitting: Medical Genetics

## 2023-05-07 NOTE — Progress Notes (Signed)
 PERIOPERATIVE PRESCRIPTION FOR IMPLANTED CARDIAC DEVICE PROGRAMMING   Patient Information:  Patient: Dakota Martin  MRN: [id number]  Date of Birth: 28-Aug-1940      Planned Procedure:   COLONOSCOPY  Date of Procedure:  06/10/2023  Surgeon:  DR LE[facility]  Fax- [phone]  ATTN: MADISON FAVRE, CMA     Device Information:   Clinic EP Physician:   Dr. Elspeth Sage Device Type:  Pacemaker Manufacturer and Phone #:  [employer]: [phone] Pacemaker Dependent?:  Yes Date of Last Device Check:  03/15/2023        Normal Device Function?:  Yes     Electrophysiologist's Recommendations:   Have magnet available. Provide continuous ECG monitoring when magnet is used or reprogramming is to be performed.  Procedure may interfere with device function.  Magnet should be placed over device during procedure.  Per Device Clinic Standing Orders, Dakota Martin  05/07/2023 9:58 AM

## 2023-05-17 LAB — GENECONNECT MOLECULAR SCREEN: Genetic Analysis Overall Interpretation: NEGATIVE

## 2023-05-24 ENCOUNTER — Other Ambulatory Visit: Payer: Self-pay | Admitting: Cardiovascular Disease

## 2023-05-30 ENCOUNTER — Encounter: Payer: Self-pay | Admitting: Gastroenterology

## 2023-06-10 ENCOUNTER — Ambulatory Visit (AMBULATORY_SURGERY_CENTER): Payer: Medicare Other | Admitting: Gastroenterology

## 2023-06-10 ENCOUNTER — Encounter: Payer: Self-pay | Admitting: Gastroenterology

## 2023-06-10 VITALS — BP 137/62 | HR 70 | Temp 97.3°F | Resp 15 | Ht 68.0 in | Wt 168.2 lb

## 2023-06-10 DIAGNOSIS — K5730 Diverticulosis of large intestine without perforation or abscess without bleeding: Secondary | ICD-10-CM

## 2023-06-10 DIAGNOSIS — D123 Benign neoplasm of transverse colon: Secondary | ICD-10-CM | POA: Diagnosis not present

## 2023-06-10 DIAGNOSIS — D122 Benign neoplasm of ascending colon: Secondary | ICD-10-CM | POA: Diagnosis not present

## 2023-06-10 DIAGNOSIS — Z1211 Encounter for screening for malignant neoplasm of colon: Secondary | ICD-10-CM

## 2023-06-10 DIAGNOSIS — Z860100 Personal history of colon polyps, unspecified: Secondary | ICD-10-CM

## 2023-06-10 DIAGNOSIS — K648 Other hemorrhoids: Secondary | ICD-10-CM

## 2023-06-10 MED ORDER — SODIUM CHLORIDE 0.9 % IV SOLN: 500.0000 mL | Freq: Once | INTRAVENOUS | Status: DC

## 2023-06-10 NOTE — Patient Instructions (Addendum)
 Patient has a contact number available for                            emergencies. The signs and symptoms of potential                            delayed complications were discussed with the                            patient. Return to normal activities tomorrow.                            Written discharge instructions were provided to the                            patient.                           - Resume previous diet.                           - Continue present medications.                           - Resume Eliquis  tomorrow.                           - Await pathology results.  Handout on polyps and diverticulosis given.    YOU HAD AN ENDOSCOPIC PROCEDURE TODAY AT THE [facility]:   Refer to the procedure report that was given to you for any specific questions about what was found during the examination.  If the procedure report does not answer your questions, please call your gastroenterologist to clarify.  If you requested that your care partner not be given the details of your procedure findings, then the procedure report has been included in a sealed envelope for you to review at your convenience later.  YOU SHOULD EXPECT: Some feelings of bloating in the abdomen. Passage of more gas than usual.  Walking can help get rid of the air that was put into your GI tract during the procedure and reduce the bloating. If you had a lower endoscopy (such as a colonoscopy or flexible sigmoidoscopy) you may notice spotting of blood in your stool or on the toilet paper. If you underwent a bowel prep for your procedure, you may not have a normal bowel movement for a few days.  Please Note:  You might notice some irritation and congestion in your nose or some drainage.  This is from the oxygen used during your procedure.  There is no need for concern and it should clear up in a day or so.  SYMPTOMS TO REPORT IMMEDIATELY:  Following lower endoscopy (colonoscopy or flexible  sigmoidoscopy):  Excessive amounts of blood in the stool  Significant tenderness or worsening of abdominal pains  Swelling of the abdomen that is new, acute  Fever of 100F or higher  For urgent or emergent issues, a gastroenterologist can be reached at any hour by calling [phone]. Do not use MyChart messaging for urgent concerns.    DIET:  We do recommend a small meal at  first, but then you may proceed to your regular diet.  Drink plenty of fluids but you should avoid alcoholic beverages for 24 hours.  ACTIVITY:  You should plan to take it easy for the rest of today and you should NOT DRIVE or use heavy machinery until tomorrow (because of the sedation medicines used during the test).    FOLLOW UP: Our staff will call the number listed on your records the next business day following your procedure.  We will call around 7:15- 8:00 am to check on you and address any questions or concerns that you may have regarding the information given to you following your procedure. If we do not reach you, we will leave a message.     If any biopsies were taken you will be contacted by phone or by letter within the next 1-3 weeks.  Please call us  at [phone] if you have not heard about the biopsies in 3 weeks.    SIGNATURES/CONFIDENTIALITY: You and/or your care partner have signed paperwork which will be entered into your electronic medical record.  These signatures attest to the fact that that the information above on your After Visit Summary has been reviewed and is understood.  Full responsibility of the confidentiality of this discharge information lies with you and/or your care-partner.

## 2023-06-10 NOTE — Op Note (Signed)
 [facility] Patient Name: Dakota Martin Procedure Date: 06/10/2023 10:16 AM MRN: [id number] Endoscopist: Landon Pinion P. General Kenner , MD, [id number] Age: 83 Referring MD:  Date of Birth: Jan 01, 1941 Gender: Male Account #: [id number] Procedure:                Colonoscopy Indications:              High risk colon cancer surveillance: Personal                            history of colonic polyps - last exam 11/2019 - 6                            adenomas, history of advanced adenoma in 2016,                            mother had colon cancer Medicines:                Monitored Anesthesia Care Procedure:                Pre-Anesthesia Assessment:                           - Prior to the procedure, a History and Physical                            was performed, and patient medications and                            allergies were reviewed. The patient's tolerance of                            previous anesthesia was also reviewed. The risks                            and benefits of the procedure and the sedation                            options and risks were discussed with the patient.                            All questions were answered, and informed consent                            was obtained. Prior Anticoagulants: The patient has                            taken Eliquis  (apixaban ), last dose was 2 days                            prior to procedure. ASA Grade Assessment: III - A                            patient with severe systemic disease. After  reviewing the risks and benefits, the patient was                            deemed in satisfactory condition to undergo the                            procedure.                           After obtaining informed consent, the colonoscope                            was passed under direct vision. Throughout the                            procedure, the patient's blood pressure, pulse, and                             oxygen saturations were monitored continuously. The                            Olympus Scope SN: [id number] was introduced through                            the anus and advanced to the the cecum, identified                            by appendiceal orifice and ileocecal valve. The                            colonoscopy was performed without difficulty. The                            patient tolerated the procedure well. The quality                            of the bowel preparation was adequate. The                            ileocecal valve, appendiceal orifice, and rectum                            were photographed. Scope In: 10:24:09 AM Scope Out: 10:43:12 AM Scope Withdrawal Time: 0 hours 15 minutes 33 seconds  Total Procedure Duration: 0 hours 19 minutes 3 seconds  Findings:                 The perianal and digital rectal examinations were                            normal.                           A 4 mm polyp was found in the ascending colon. The  polyp was sessile. The polyp was removed with a                            cold snare. Resection and retrieval were complete.                           Two flat and sessile polyps were found in the                            transverse colon. The polyps were 3 to 4 mm in                            size. These polyps were removed with a cold snare.                            Resection was complete, but the polyp tissue was                            only partially retrieved.                           A few small-mouthed diverticula were found in the                            sigmoid colon.                           Internal hemorrhoids were found during retroflexion.                           The exam was otherwise without abnormality. Complications:            No immediate complications. Estimated blood loss:                            Minimal. Estimated Blood Loss:     Estimated blood loss was  minimal. Impression:               - One 4 mm polyp in the ascending colon, removed                            with a cold snare. Resected and retrieved.                           - Two 3 to 4 mm polyps in the transverse colon,                            removed with a cold snare. Complete resection.                            Partial retrieval.                           - Diverticulosis in the sigmoid colon.                           -  Internal hemorrhoids.                           - The examination was otherwise normal. Recommendation:           - Patient has a contact number available for                            emergencies. The signs and symptoms of potential                            delayed complications were discussed with the                            patient. Return to normal activities tomorrow.                            Written discharge instructions were provided to the                            patient.                           - Resume previous diet.                           - Continue present medications.                           - Resume Eliquis  tomorrow.                           - Await pathology results. Landon Pinion P. General Kenner, MD 06/10/2023 10:48:01 AM This report has been signed electronically.

## 2023-06-10 NOTE — Progress Notes (Signed)
 Pt sedate, gd SR's, VSS, report to RN

## 2023-06-10 NOTE — Progress Notes (Signed)
 Called to room to assist during endoscopic procedure.  Patient ID and intended procedure confirmed with present staff. Received instructions for my participation in the procedure from the performing physician.

## 2023-06-10 NOTE — Progress Notes (Signed)
 [facility] History and Physical   Primary Care Physician:  Aldo Hun, MD   Reason for Procedure:   History of colon polyps  Plan:    colonoscopy     HPI: Dakota Martin is a 83 y.o. male  here for colonoscopy surveillance - history of polyps, last exam 11/2019 - 6 adenomas removed. Also with history of advanced adenoma remotely and mother had colon cancer. He has held Eliquis  for 2 days prior to this exam. Patient denies any bowel symptoms at this time.  Otherwise feels well without any cardiopulmonary symptoms.   I have discussed risks / benefits of anesthesia and endoscopic procedure with Dakota Martin and they wish to proceed with the exams as outlined today.    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 // [employer] Shiley mechanical valve done in 1982 // Echo 09/10/16: Mild focal basal septal hypertrophy, [name], [name], [name], mod aortic root enlargement, aortic root 36 mm, ascending aorta 49 mm, [name], [name]    Past Surgical History:  Procedure Laterality Date   Aortic valve  1982   prosthetic  val ve   AORTIC VALVE REPLACEMENT N/A 11/21/2016   Procedure: REDO AORTIC VALVE REPLACEMENT (AVR);  Surgeon: Zelphia Higashi, MD;  [facility];  Service: Open Heart Surgery;  Laterality: N/A;    CARDIAC CATHETERIZATION     CARDIOVERSION N/A 02/06/2022   Procedure: CARDIOVERSION;  Surgeon: Jacqueline Matsu, MD;  Location: [facility] ENDOSCOPY;  Service: Cardiovascular;  Laterality: N/A;   COLONOSCOPY  12/04/2019   COLONOSCOPY  07/13/2014   CORONARY ARTERY BYPASS GRAFT N/A 11/21/2016   Procedure: CORONARY ARTERY BYPASS GRAFTING (CABG) x 2, using right leg greater saphenous vein harvested endoscopically;  Surgeon: Zelphia Higashi, MD;  Location: [facility] OR;  Service: Open Heart Surgery;  Laterality: N/A;   cyst removed  1976   right side of neck   PACEMAKER IMPLANT N/A 11/26/2016   Procedure: Pacemaker Implant;  Surgeon: Verona Goodwill, MD;  Location: [facility] INVASIVE CV LAB;  Service: Cardiovascular;  Laterality: N/A;   TEE WITHOUT CARDIOVERSION N/A 11/21/2016   Procedure: TRANSESOPHAGEAL ECHOCARDIOGRAM (TEE);  Surgeon: Zelphia Higashi, MD;  Location: [facility] OR;  Service: Open Heart Surgery;  Laterality: N/A;   THORACIC AORTIC ANEURYSM REPAIR N/A 11/21/2016   Procedure: THORACIC ASCENDING ANEURYSM REPAIR (AAA);  Surgeon: Zelphia Higashi, MD;  Location: [facility] OR;  Service: Open Heart Surgery;  Laterality: N/A;   TONSILLECTOMY  [birth year]/30/1952    Prior to Admission medications   Medication Sig Start Date End Date Taking? Authorizing Provider  acetaminophen  (TYLENOL ) 500 MG tablet Take 500-1,000 mg by mouth every 6 (six) hours as needed (pain.).   Yes [provider]  allopurinol  (ZYLOPRIM )  300 MG tablet Take 300 mg by mouth daily at 12 noon.    Yes [provider]  atorvastatin  (LIPITOR) 10 MG tablet TAKE 1 TABLET BY MOUTH ONCE DAILY for 90 days   Yes [provider]  carvedilol  (COREG ) 6.25 MG tablet TAKE 1 AND 1/2 TABLETS BY MOUTH  TWICE DAILY WITH MEALS 05/27/23  Yes Nahser, Lela Purple, MD  cetirizine (ZYRTEC) 10 MG tablet Take 10 mg by mouth daily.   Yes [provider]  Cholecalciferol (VITAMIN D) 125 MCG (5000 UT) CAPS Take 5,000 Units by mouth every  Tuesday, Thursday, and Saturday at 6 PM.   Yes [provider]  ezetimibe  (ZETIA ) 10 MG tablet Take 5 mg by mouth in the morning.   Yes [provider]  sildenafil (VIAGRA) 100 MG tablet 1/2 to 1  tab 30 min. to 4 hours prior to sexual activity Orally 07/10/21  Yes [provider]  telmisartan (MICARDIS) 80 MG tablet Take 80 mg by mouth every evening. 04/12/14  Yes [provider]  Coenzyme Q10 100 MG capsule Take 100 mg by mouth every evening. Patient not taking: Reported on 06/10/2023    [provider]  Cyanocobalamin (VITAMIN B-12) 2500 MCG SUBL Place 2,500 mcg under the tongue 2 (two) times a week. Wednesdays & Saturdays in the morning Patient not taking: Reported on 06/10/2023    [provider]  ELIQUIS  5 MG TABS tablet TAKE 1 TABLET BY MOUTH TWICE  DAILY 04/03/22   Carroll, Donna C, NP  MAGNESIUM  CITRATE PO Take 500 mg by mouth every evening. Patient not taking: Reported on 06/10/2023    [provider]  Omega-3 Fatty Acids (OMEGA 3 500 PO) Take 1 tablet by mouth daily. Patient not taking: Reported on 06/10/2023    [provider]  Turmeric (QC TUMERIC COMPLEX PO) Take 500 mg by mouth in the morning and at bedtime. Patient not taking: Reported on 06/10/2023    [provider]    Current Outpatient Medications  Medication Sig Dispense Refill   acetaminophen  (TYLENOL ) 500 MG tablet Take 500-1,000 mg by mouth every 6 (six) hours as needed (pain.).     allopurinol  (ZYLOPRIM ) 300 MG tablet Take 300 mg by mouth daily at 12 noon.      atorvastatin  (LIPITOR) 10 MG tablet TAKE 1 TABLET BY MOUTH ONCE DAILY for 90 days     carvedilol  (COREG ) 6.25 MG tablet TAKE 1 AND 1/2 TABLETS BY MOUTH  TWICE DAILY WITH MEALS 270 tablet 0   cetirizine (ZYRTEC) 10 MG tablet Take 10 mg by mouth daily.     Cholecalciferol (VITAMIN D) 125 MCG (5000 UT) CAPS Take 5,000 Units by mouth every Tuesday, Thursday, and Saturday at 6 PM.      ezetimibe  (ZETIA ) 10 MG tablet Take 5 mg by mouth in the morning.     sildenafil (VIAGRA) 100 MG tablet 1/2 to 1  tab 30 min. to 4 hours prior to sexual activity Orally     telmisartan (MICARDIS) 80 MG tablet Take 80 mg by mouth every evening.  11   Coenzyme Q10 100 MG capsule Take 100 mg by mouth every evening. (Patient not taking: Reported on 06/10/2023)     Cyanocobalamin (VITAMIN B-12) 2500 MCG SUBL Place 2,500 mcg under the tongue 2 (two) times a week. Wednesdays & Saturdays in the morning (Patient not taking: Reported on 06/10/2023)     ELIQUIS  5 MG TABS tablet TAKE 1 TABLET BY MOUTH TWICE  DAILY 200  tablet 2   MAGNESIUM  CITRATE PO Take 500 mg by mouth every evening. (Patient not taking: Reported on 06/10/2023)     Omega-3 Fatty Acids (OMEGA 3 500 PO) Take 1 tablet by mouth daily. (Patient not taking: Reported on 06/10/2023)     Turmeric (QC TUMERIC COMPLEX PO) Take 500 mg by mouth in the morning and at bedtime. (Patient not taking: Reported on 06/10/2023)     Current Facility-Administered Medications  Medication Dose Route Frequency Provider Last Rate Last Admin   0.9 %  sodium chloride  infusion  500 mL Intravenous Once Linzee Depaul, Lendon Queen, MD        Allergies as of 06/10/2023 - Review Complete 06/10/2023  Allergen Reaction Noted   Benazepril Other (See Comments) 03/22/2009   Turmeric Rash 10/24/2022    Family History  Problem Relation Age of Onset   Hyperlipidemia Mother    Colon cancer Mother    Emphysema Father    Other Father        respiratory failure   Other Brother    Diabetes Maternal Grandfather    Colon polyps Neg Hx    Kidney disease Neg Hx    Esophageal cancer Neg Hx    Gallbladder disease Neg Hx    Stomach cancer Neg Hx    Rectal cancer Neg Hx     Social History   Socioeconomic History   Marital status: Married    Spouse name: Not on file   Number of children: 2   Years of education: Not on file   Highest education level: Not on file  Occupational History    Occupation: Retired  Tobacco Use   Smoking status: Never   Smokeless tobacco: Never  Vaping Use   Vaping status: Never Used  Substance and Sexual Activity   Alcohol use: Yes    Alcohol/week: 0.0 standard drinks of alcohol    Comment: 2 drinks a day   Drug use: No   Sexual activity: Not on file  Other Topics Concern   Not on file  Social History Narrative   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  Physical Exam: Vital signs BP (!) 162/77   Pulse 62   Temp (!) 97.3 F (36.3 C) (Temporal)   Ht 5\' 8"  (1.727 m)   Wt 168 lb 3.2 oz (76.3 kg)   SpO2 100%   BMI 25.57 kg/m   General:   Alert,  Well-developed, pleasant and cooperative in NAD Lungs:  Clear throughout to auscultation.   Heart:  Regular rate and rhythm Abdomen:  Soft, nontender and nondistended.   Neuro/Psych:  Alert and cooperative. Normal mood and affect. A and O x 3  Christi Coward, MD [facility]

## 2023-06-11 ENCOUNTER — Telehealth: Payer: Self-pay

## 2023-06-11 NOTE — Telephone Encounter (Signed)
LEFT MESSAGE ON ANSWERING MACHINE

## 2023-06-12 LAB — SURGICAL PATHOLOGY

## 2023-06-14 ENCOUNTER — Ambulatory Visit (INDEPENDENT_AMBULATORY_CARE_PROVIDER_SITE_OTHER): Payer: Medicare Other

## 2023-06-14 DIAGNOSIS — I442 Atrioventricular block, complete: Secondary | ICD-10-CM

## 2023-06-14 LAB — CUP PACEART REMOTE DEVICE CHECK
Battery Remaining Longevity: 35 mo
Battery Voltage: 2.93 V
Brady Statistic AP VP Percent: 48.22 %
Brady Statistic AP VS Percent: 0 %
Brady Statistic AS VP Percent: 51.64 %
Brady Statistic AS VS Percent: 0.14 %
Brady Statistic RA Percent Paced: 47.61 %
Brady Statistic RV Percent Paced: 99.86 %
Date Time Interrogation Session: 20250214000811
Implantable Lead Connection Status: 753985
Implantable Lead Connection Status: 753985
Implantable Lead Implant Date: 20180730
Implantable Lead Implant Date: 20180730
Implantable Lead Location: 753859
Implantable Lead Location: 753860
Implantable Lead Model: 5076
Implantable Lead Model: 5076
Implantable Pulse Generator Implant Date: 20180730
Lead Channel Impedance Value: 304 Ohm
Lead Channel Impedance Value: 380 Ohm
Lead Channel Impedance Value: 418 Ohm
Lead Channel Impedance Value: 456 Ohm
Lead Channel Pacing Threshold Amplitude: 0.5 V
Lead Channel Pacing Threshold Amplitude: 0.625 V
Lead Channel Pacing Threshold Pulse Width: 0.4 ms
Lead Channel Pacing Threshold Pulse Width: 0.4 ms
Lead Channel Sensing Intrinsic Amplitude: 1.25 mV
Lead Channel Sensing Intrinsic Amplitude: 1.25 mV
Lead Channel Sensing Intrinsic Amplitude: 20 mV
Lead Channel Sensing Intrinsic Amplitude: 20 mV
Lead Channel Setting Pacing Amplitude: 1.5 V
Lead Channel Setting Pacing Amplitude: 2.5 V
Lead Channel Setting Pacing Pulse Width: 0.4 ms
Lead Channel Setting Sensing Sensitivity: 4 mV
Zone Setting Status: 755011
Zone Setting Status: 755011

## 2023-06-15 ENCOUNTER — Encounter: Payer: Self-pay | Admitting: Gastroenterology

## 2023-07-04 ENCOUNTER — Encounter: Payer: Self-pay | Admitting: Cardiovascular Disease

## 2023-07-04 NOTE — Progress Notes (Signed)
 Cardiology Office Note   Date:  07/05/2023   ID:  [name], [name] 27-Jun-1940, MRN [id number]  PCP:  Rodrigo Ran, MD  Cardiologist:   Kristeen Miss, MD   Chief Complaint  Patient presents with   Hypertension        Atrial Fibrillation   Coronary Artery Disease           Dakota 19, 2016:  Diaz Martin is a 83 y.o. male who presents for follow-up of his essential hypertension and mild aortic root dilatation. We had HCTZ and Kdur at his last visit.  BP readings have been well controlled.    Expand All Collapse All     Problem List: 1. 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   2. Chronic coumadin therapy.   3. Hypertension 4. Hyperlipidemia 5. Moderate dilation of the ascending aorta ( 50 mm)    Previous notes    Dakota Martin  is a 83 yo with hx of AVR in 1982.   He has been doing well.  No CP , no dyspnea.    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    Oct. 27, 2015:  Dakota Martin  is seen back for a scheduled visit. He had a recent echo that revealed a dilated aortic root ( 50 cm)     Jan. 19, 2016:  Dakota Martin  is seen back today for follow-up of his aortic valve replacement and history of hypertension. He has a dilated aortic root. Also has a history of hypertension and hyperlipidemia. He has been recording his BP daily. He has substituted generic Micardis ( Telmisartan)  for his Benicar  Dakota 19, 2016:    Nov. 4, 2016:  Dakota Martin is seen back today for follow-up visit for his aortic valve replacement and aortic aneurysm. Doing great.  Feeling well.  Has seen Dr. Dorris Fetch - the ascending aortic aneurism seems stable .  Playing tennis regularly  - plays 2-3 times a week .  Going for a right neck lymph node bx.  Bridging with Lovenox.  ( Managed by Medical Resource Manager )   Sep 14, 2015:  Doing well. Playing tennis . Lots of yard work  . Had a lymph node bx last Nov.   Was benign.   Was cystic..  Drained it but it has returned.   Sept. 4, 2018:  Dakota Martin is doing great after his asc. Aortic aneurism repair / AVR ( bioprosthetic ) / CABG  Had a pacer placed later in the hospitalization.  Will follow up with Dr. Ladona Ridgel  Overall doing well.   His appetite has not fully recovered.  Has minimal chest wall tenderness.   The plan is to stop his coumadin for another month - INR levels are managed by MRM at [facility] office.  He asked about the statin   March 14, 2018:.  Is doing well after his ascending aortic aneurysm repair/aortic valve replacement.  He also has coronary artery bypass grafting.  He had a pacemaker placed later that hospitalization.  Active.  Plays tennis - 3 times a week .  No CP or dyspnea  Recent lipids  look great Chol = 115 HDL = 38 LDL = 62 Trigs = 73 His ALT was a little high.  He will follow-up with Dr. Waynard Edwards on that.  Dec. 9, 2020    Doing well. No cp or  dyspnea Plays indoor tennis ( has been trying to wear his mask )  No CP or dyspnea.   Labs from Dr. Dell Ponto office March 24, 2019: Cholesterol is 138.  HDL is 49.  LDL 74.  Triglyceride level 77.  Hemoglobin is 15.1.  Creatinine is 1.1. BP is well controlled at home Watches his salt intake   Jan. 28, 2022: BP is well controlled. At home  No CP or dyspnea  Last echo in May, 2018 showed normal LV function .  He has a 4.0 ascending aortic aneurysm that is being followed by Dr. Dorris Fetch.  Feb. 27, 2023   Pt rescheduled    Feb. 28, 2024  Dakota Martin is seen today for follow up of his CABG, Bentall procedure ( July 2018 with  CABG x 2)     Was incidentally found to have atrial fib  Successful cardioversion Oct. 2023 Has been maintained on Eliquis    Was seen by Rudi Coco in the aifb clinic  in oct. 2023   He has numerous questions about Eliquis and various other DOACs  Concerned about how to treat his MSK pain.  He takes  Ibuprofen and Curcumin regularly   Dr. Waynard Edwards is managing his lipids    July 05, 2023 Dakota Martin is seen for follow up of his CAD, atrial fib,  Bentall procedure  BP readings at home are good  A bit elevated here in the office  No CP  no dyspnea    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 // [employer] Shiley mechanical valve done in 1982 // Echo 09/10/16: Mild focal basal septal hypertrophy, [name], [name], [name], mod aortic root enlargement, aortic root 36 mm, ascending aorta 49 mm, [name], [name]    Past Surgical History:  Procedure Laterality Date   Aortic valve  1982   prosthetic  val ve   AORTIC VALVE REPLACEMENT N/A 11/21/2016   Procedure: REDO AORTIC VALVE REPLACEMENT (AVR);  Surgeon: Loreli Slot, MD;  Location: [facility] OR;  Service: Open Heart Surgery;  Laterality: N/A;   CARDIAC CATHETERIZATION     CARDIOVERSION N/A 02/06/2022   Procedure: CARDIOVERSION;  Surgeon: Quintella Reichert, MD;  Location: [facility] ENDOSCOPY;  Service: Cardiovascular;  Laterality: N/A;   COLONOSCOPY  12/04/2019   COLONOSCOPY  07/13/2014   CORONARY ARTERY BYPASS GRAFT N/A 11/21/2016   Procedure: CORONARY ARTERY BYPASS GRAFTING (CABG) x 2, using right leg greater saphenous vein harvested endoscopically;  Surgeon: Loreli Slot, MD;  Location: [facility] OR;   Service: Open Heart Surgery;  Laterality: N/A;   cyst removed  1976   right side of neck   PACEMAKER IMPLANT N/A 11/26/2016   Procedure: Pacemaker Implant;  Surgeon: Duke Salvia, MD;  Location:  [facility] INVASIVE CV LAB;  Service: Cardiovascular;  Laterality: N/A;   TEE WITHOUT CARDIOVERSION N/A 11/21/2016   Procedure: TRANSESOPHAGEAL ECHOCARDIOGRAM (TEE);  Surgeon: Loreli Slot, MD;  Location: [facility] OR;  Service: Open Heart Surgery;  Laterality: N/A;   THORACIC AORTIC ANEURYSM REPAIR N/A 11/21/2016   Procedure: THORACIC ASCENDING ANEURYSM REPAIR (AAA);  Surgeon: Loreli Slot, MD;  Location: [facility] OR;  Service: Open Heart Surgery;  Laterality: N/A;   TONSILLECTOMY  04/30/1953     Current Outpatient Medications  Medication Sig Dispense Refill   acetaminophen (TYLENOL) 500 MG tablet Take 500-1,000 mg by mouth every 6 (six) hours as needed (pain.).     allopurinol (ZYLOPRIM) 300 MG tablet Take 300 mg by mouth daily at 12 noon.      atorvastatin (LIPITOR) 10 MG tablet TAKE 1 TABLET BY MOUTH ONCE DAILY for 90 days     carvedilol (COREG) 6.25 MG tablet TAKE 1 AND 1/2 TABLETS BY MOUTH  TWICE DAILY WITH MEALS 270 tablet 0   cetirizine (ZYRTEC) 10 MG tablet Take 10 mg by mouth daily.     Cholecalciferol (VITAMIN D) 125 MCG (5000 UT) CAPS Take 5,000 Units by mouth every Tuesday, Thursday, and Saturday at 6 PM.     ELIQUIS 5 MG TABS tablet TAKE 1 TABLET BY MOUTH TWICE  DAILY 200 tablet 2   ezetimibe (ZETIA) 10 MG tablet Take 5 mg by mouth in the morning.     sildenafil (VIAGRA) 100 MG tablet 1/2 to 1  tab 30 min. to 4 hours prior to sexual activity Orally     telmisartan (MICARDIS) 80 MG tablet Take 80 mg by mouth every evening.  11   Coenzyme Q10 100 MG capsule Take 100 mg by mouth every evening. (Patient not taking: Reported on 06/10/2023)     Cyanocobalamin (VITAMIN B-12) 2500 MCG SUBL Place 2,500 mcg under the tongue 2 (two) times a week. Wednesdays & Saturdays in the morning (Patient not  taking: Reported on 06/10/2023)     MAGNESIUM CITRATE PO Take 500 mg by mouth every evening. (Patient not taking: Reported on 06/10/2023)     Omega-3 Fatty Acids (OMEGA 3 500 PO) Take 1 tablet by mouth daily. (Patient not taking: Reported on 07/05/2023)     No current facility-administered medications for this visit.    Allergies:   Benazepril and Turmeric    Social History:  The patient  reports that he has never smoked. He has never used smokeless tobacco. He reports current alcohol use. He reports that he does not use drugs.   Family History:  The patient's family history includes Colon cancer in his mother; Diabetes in his maternal grandfather; Emphysema in his father; Hyperlipidemia in his mother; Other in his brother and father.    ROS:  Please see the history of present illness.   Otherwise, review of systems are positive for none.   All other systems are reviewed and negative.    Physical Exam: Blood pressure 124/84, pulse (!) 59, height 5\' 8"  (1.727 m), weight 172 lb 12.8 oz (78.4 kg), SpO2 99%.       GEN:  Well nourished, well developed in no acute distress HEENT: Normal NECK: No JVD; No carotid bruits LYMPHATICS: No lymphadenopathy CARDIAC: RRR , soft systolic murmur ,  occasional premature beats RESPIRATORY:  Clear to auscultation without rales, wheezing or rhonchi  ABDOMEN: Soft, non-tender, non-distended MUSCULOSKELETAL:  No edema; No deformity  SKIN: Warm and dry NEUROLOGIC:  Alert and oriented x 3  EKG:           Recent Labs: No results found for requested labs within last 365 days.    Lipid Panel No results found for: "CHOL", "TRIG", "HDL", "CHOLHDL", "VLDL", "LDLCALC", "LDLDIRECT"    Wt Readings from Last 3 Encounters:  07/05/23 172 lb 12.8 oz (78.4 kg)  06/10/23 168 lb 3.2 oz (76.3 kg)  04/18/23 168 lb 2 oz (76.3 kg)      Other studies Reviewed:    ASSESSMENT AND PLAN:  1.  Essential Hypertension:   -    BP is well controlled.   2. Aortic  valve replacement:   -s/p Bentall procedure       3. AV heart block:   - s/p pacer   4. Hyperlipidemia:     lipids are managed by Dr. Waynard Edwards.  His last LDL is 59 .     5.  Paroxysmal atrial fibrillation:    Current medicines are reviewed at length with the patient today.  The patient does not have concerns regarding medicines.  Labs/ tests ordered today include:   No orders of the defined types were placed in this encounter.    Disposition:   FU  In 1 year       Kristeen Miss, MD  07/05/2023 3:59 PM    [facility] [address] [place], [place], [place]  [address] Phone: [phone]; Fax: [phone]

## 2023-07-05 ENCOUNTER — Ambulatory Visit: Payer: Medicare Other | Attending: Cardiovascular Disease | Admitting: Cardiovascular Disease

## 2023-07-05 ENCOUNTER — Encounter: Payer: Self-pay | Admitting: Cardiovascular Disease

## 2023-07-05 VITALS — BP 124/84 | HR 59 | Ht 68.0 in | Wt 172.8 lb

## 2023-07-05 DIAGNOSIS — Z952 Presence of prosthetic heart valve: Secondary | ICD-10-CM | POA: Diagnosis not present

## 2023-07-05 DIAGNOSIS — E782 Mixed hyperlipidemia: Secondary | ICD-10-CM | POA: Diagnosis not present

## 2023-07-05 NOTE — Patient Instructions (Signed)

## 2023-07-15 NOTE — Progress Notes (Signed)
 Remote pacemaker transmission.

## 2023-07-16 ENCOUNTER — Encounter: Payer: Self-pay | Admitting: Internal Medicine

## 2023-07-23 ENCOUNTER — Other Ambulatory Visit: Payer: Self-pay | Admitting: Cardiovascular Disease

## 2023-09-13 ENCOUNTER — Ambulatory Visit (INDEPENDENT_AMBULATORY_CARE_PROVIDER_SITE_OTHER): Payer: Medicare Other

## 2023-09-13 DIAGNOSIS — I442 Atrioventricular block, complete: Secondary | ICD-10-CM

## 2023-09-13 LAB — CUP PACEART REMOTE DEVICE CHECK
Battery Remaining Longevity: 31 mo
Battery Voltage: 2.92 V
Brady Statistic AP VP Percent: 48.5 %
Brady Statistic AP VS Percent: 0 %
Brady Statistic AS VP Percent: 51.31 %
Brady Statistic AS VS Percent: 0.19 %
Brady Statistic RA Percent Paced: 47.9 %
Brady Statistic RV Percent Paced: 99.81 %
Date Time Interrogation Session: 20250516010726
Implantable Lead Connection Status: 753985
Implantable Lead Connection Status: 753985
Implantable Lead Implant Date: 20180730
Implantable Lead Implant Date: 20180730
Implantable Lead Location: 753859
Implantable Lead Location: 753860
Implantable Lead Model: 5076
Implantable Lead Model: 5076
Implantable Pulse Generator Implant Date: 20180730
Lead Channel Impedance Value: 304 Ohm
Lead Channel Impedance Value: 399 Ohm
Lead Channel Impedance Value: 418 Ohm
Lead Channel Impedance Value: 456 Ohm
Lead Channel Pacing Threshold Amplitude: 0.5 V
Lead Channel Pacing Threshold Amplitude: 0.625 V
Lead Channel Pacing Threshold Pulse Width: 0.4 ms
Lead Channel Pacing Threshold Pulse Width: 0.4 ms
Lead Channel Sensing Intrinsic Amplitude: 1.125 mV
Lead Channel Sensing Intrinsic Amplitude: 1.125 mV
Lead Channel Sensing Intrinsic Amplitude: 20 mV
Lead Channel Sensing Intrinsic Amplitude: 20 mV
Lead Channel Setting Pacing Amplitude: 1.5 V
Lead Channel Setting Pacing Amplitude: 2.5 V
Lead Channel Setting Pacing Pulse Width: 0.4 ms
Lead Channel Setting Sensing Sensitivity: 4 mV
Zone Setting Status: 755011
Zone Setting Status: 755011

## 2023-09-25 ENCOUNTER — Ambulatory Visit: Payer: Self-pay | Admitting: Cardiovascular Disease

## 2023-10-21 NOTE — Progress Notes (Signed)
 Remote pacemaker transmission.

## 2023-12-10 ENCOUNTER — Other Ambulatory Visit: Payer: Self-pay | Admitting: Thoracic Surgery (Cardiothoracic Vascular Surgery)

## 2023-12-10 DIAGNOSIS — I7121 Aneurysm of the ascending aorta, without rupture: Secondary | ICD-10-CM

## 2023-12-13 ENCOUNTER — Ambulatory Visit (INDEPENDENT_AMBULATORY_CARE_PROVIDER_SITE_OTHER): Payer: Medicare Other

## 2023-12-13 DIAGNOSIS — I442 Atrioventricular block, complete: Secondary | ICD-10-CM | POA: Diagnosis not present

## 2023-12-16 LAB — CUP PACEART REMOTE DEVICE CHECK
Battery Remaining Longevity: 28 mo
Battery Voltage: 2.91 V
Brady Statistic AP VP Percent: 50.57 %
Brady Statistic AP VS Percent: 0 %
Brady Statistic AS VP Percent: 49.29 %
Brady Statistic AS VS Percent: 0.14 %
Brady Statistic RA Percent Paced: 49.95 %
Brady Statistic RV Percent Paced: 99.86 %
Date Time Interrogation Session: 20250815010700
Implantable Lead Connection Status: 753985
Implantable Lead Connection Status: 753985
Implantable Lead Implant Date: 20180730
Implantable Lead Implant Date: 20180730
Implantable Lead Location: 753859
Implantable Lead Location: 753860
Implantable Lead Model: 5076
Implantable Lead Model: 5076
Implantable Pulse Generator Implant Date: 20180730
Lead Channel Impedance Value: 304 Ohm
Lead Channel Impedance Value: 380 Ohm
Lead Channel Impedance Value: 380 Ohm
Lead Channel Impedance Value: 418 Ohm
Lead Channel Pacing Threshold Amplitude: 0.5 V
Lead Channel Pacing Threshold Amplitude: 0.625 V
Lead Channel Pacing Threshold Pulse Width: 0.4 ms
Lead Channel Pacing Threshold Pulse Width: 0.4 ms
Lead Channel Sensing Intrinsic Amplitude: 1.125 mV
Lead Channel Sensing Intrinsic Amplitude: 1.125 mV
Lead Channel Sensing Intrinsic Amplitude: 20 mV
Lead Channel Sensing Intrinsic Amplitude: 20 mV
Lead Channel Setting Pacing Amplitude: 1.5 V
Lead Channel Setting Pacing Amplitude: 2.5 V
Lead Channel Setting Pacing Pulse Width: 0.4 ms
Lead Channel Setting Sensing Sensitivity: 4 mV
Zone Setting Status: 755011
Zone Setting Status: 755011

## 2024-01-14 ENCOUNTER — Ambulatory Visit (HOSPITAL_COMMUNITY)
Admission: RE | Admit: 2024-01-14 | Discharge: 2024-01-14 | Disposition: A | Discharge status: Home / Self Care | Source: Ambulatory Visit | Attending: Thoracic Surgery (Cardiothoracic Vascular Surgery) | Admitting: Thoracic Surgery (Cardiothoracic Vascular Surgery)

## 2024-01-14 DIAGNOSIS — Z951 Presence of aortocoronary bypass graft: Secondary | ICD-10-CM | POA: Insufficient documentation

## 2024-01-14 DIAGNOSIS — I700 Atherosclerosis of aorta: Secondary | ICD-10-CM | POA: Diagnosis not present

## 2024-01-14 DIAGNOSIS — I7121 Aneurysm of the ascending aorta, without rupture: Secondary | ICD-10-CM | POA: Insufficient documentation

## 2024-01-14 DIAGNOSIS — Z952 Presence of prosthetic heart valve: Secondary | ICD-10-CM | POA: Diagnosis not present

## 2024-01-22 NOTE — Progress Notes (Signed)
 Remote PPM Transmission

## 2024-01-28 ENCOUNTER — Ambulatory Visit

## 2024-01-28 VITALS — BP 112/62 | HR 65 | Resp 20 | Ht 68.0 in | Wt 175.0 lb

## 2024-01-28 DIAGNOSIS — I7121 Aneurysm of the ascending aorta, without rupture: Secondary | ICD-10-CM

## 2024-01-28 NOTE — Patient Instructions (Addendum)
 Risk Modification in those with ascending thoracic aortic aneurysm:   Continue control of blood pressure (prefer BP 130/80 or less)   2. Avoid fluoroquinolone antibiotics (I.e Ciprofloxacin, Avelox, Levofloxacin, Ofloxacin)   3.  Use of statin (to decrease cardiovascular risk)   4.  Exercise and activity limitations is individualized, but in general, contact sports are to be avoided and one should avoid heavy lifting (defined as half of ideal body weight) and exercises involving sustained Valsalva maneuver.   5.  Follow-up in 1 year with CT chest.

## 2024-01-28 NOTE — Progress Notes (Signed)
 [address] [place] [address]             [phone]            Deshay Blumenfeld [id number] 05-08-1940   History of Present Illness:  Dakota Martin is an 83 year old man with medical history of aortic stenosis, Laree Andes AVR in 1982, ascending aneurysm, CAD, bio-Bentall repair with CABG x 2 in 2018, complete heart block requiring permanent pacemaker, hypertension, hyperlipidemia, aortic atherosclerosis, and atrial fibrillation.  He presents to the clinic for continued follow up of dilatation of the ascending aorta above the graft. Ascending aorta measured 4.1 cm on CT of chest.   His blood pressure is well controlled with current medication therapy.  He does exercise by playing tennis, doing yard work and going to [employer]  He denies heavy lifting.  He denies chest pain, shortness of breath and lower leg swelling.    Current Outpatient Medications on File Prior to Visit  Medication Sig Dispense Refill   acetaminophen  (TYLENOL ) 500 MG tablet Take 500-1,000 mg by mouth every 6 (six) hours as needed (pain.).     allopurinol  (ZYLOPRIM ) 300 MG tablet Take 300 mg by mouth daily at 12 noon.      atorvastatin  (LIPITOR) 10 MG tablet TAKE 1 TABLET BY MOUTH ONCE DAILY for 90 days     carvedilol  (COREG ) 6.25 MG tablet TAKE 1 AND 1/2 TABLETS BY MOUTH  TWICE DAILY WITH MEALS 270 tablet 3   cetirizine (ZYRTEC) 10 MG tablet Take 10 mg by mouth daily.     Cholecalciferol (VITAMIN D) 125 MCG (5000 UT) CAPS Take 5,000 Units by mouth every Tuesday, Thursday, and Saturday at 6 PM.     ELIQUIS  5 MG TABS tablet TAKE 1 TABLET BY MOUTH TWICE  DAILY 200 tablet 2   ezetimibe  (ZETIA ) 10 MG tablet Take 5 mg by mouth in the morning.     sildenafil (VIAGRA) 100 MG tablet 1/2 to 1  tab 30 min. to 4 hours prior to sexual activity Orally     telmisartan (MICARDIS) 80 MG tablet Take 80 mg by mouth every evening.  11   Coenzyme Q10 100 MG capsule Take 100 mg by  mouth every evening. (Patient not taking: Reported on 01/28/2024)     Cyanocobalamin (VITAMIN B-12) 2500 MCG SUBL Place 2,500 mcg under the tongue 2 (two) times a week. Wednesdays & Saturdays in the morning (Patient not taking: Reported on 01/28/2024)     MAGNESIUM  CITRATE PO Take 500 mg by mouth every evening. (Patient not taking: Reported on 01/28/2024)     Omega-3 Fatty Acids (OMEGA 3 500 PO) Take 1 tablet by mouth daily. (Patient not taking: Reported on 01/28/2024)     No current facility-administered medications on file prior to visit.     ROS: Review of Systems  Constitutional: Negative.  Negative for malaise/fatigue.  Respiratory:  Negative for cough, shortness of breath and wheezing.   Cardiovascular:  Negative for chest pain, palpitations and leg swelling.  Neurological: Negative.  Negative for headaches.  All other systems reviewed and are negative.     BP 112/62   Pulse 65   Resp 20   Ht 5' 8 (1.727 m)   Wt 175 lb (79.4 kg)   SpO2 98% Comment: RA  BMI 26.61 kg/m   Physical Exam Constitutional:      Appearance: Normal appearance.  HENT:  Head: Normocephalic and atraumatic.  Cardiovascular:     Rate and Rhythm: Normal rate and regular rhythm.     Heart sounds: Normal heart sounds, S1 normal and S2 normal.  Pulmonary:     Effort: Pulmonary effort is normal.     Breath sounds: Normal breath sounds.  Skin:    General: Skin is warm and dry.  Neurological:     General: No focal deficit present.     Mental Status: He is alert and oriented to person, place, and time.      Imaging: CLINICAL DATA:  Aortic aneurysm suspected.   EXAM: CT CHEST WITHOUT CONTRAST   TECHNIQUE: Multidetector CT imaging of the chest was performed following the standard protocol without IV contrast.   RADIATION DOSE REDUCTION: This exam was performed according to the departmental dose-optimization program which includes automated exposure control, adjustment of the mA and/or kV  according to patient size and/or use of iterative reconstruction technique.   COMPARISON:  Chest CT 01/15/2023 and 01/11/2022.   FINDINGS: Cardiovascular: Postsurgical changes from median sternotomy, CABG, aortic valve replacement and graft repair of the ascending thoracic aorta. There is stable dilatation of the ascending aorta above the graft, measuring 4.1 cm on sagittal image 105/602 (previously 4.0 cm). Diffuse atherosclerosis of the aorta, great vessels and coronary arteries. No evidence of aortic dissection on noncontrast imaging. Stable mild cardiomegaly and left subclavian pacemaker position.   Mediastinum/Nodes: There are no enlarged mediastinal, hilar or axillary lymph nodes.Small mediastinal lymph nodes appear unchanged. The thyroid  gland, trachea and esophagus demonstrate no significant findings.   Lungs/Pleura: No pleural effusion or pneumothorax. Small calcified right lung granulomas and scattered mild subpleural reticulation. No confluent airspace disease or suspicious nodularity.   Upper abdomen: The visualized upper abdomen appears stable, without acute findings. There are scattered hepatic cysts and calcified splenic granulomas.   Musculoskeletal/Chest wall: There is no chest wall mass or suspicious osseous finding. Healed median sternotomy. Glenohumeral degenerative changes and mild multilevel spondylosis.   IMPRESSION: 1. Stable postsurgical changes from median sternotomy, CABG, aortic valve replacement and graft repair of the ascending thoracic aorta. 2. Stable dilatation of the ascending aorta above the graft, measuring 4.1 cm. 3. No acute chest findings. 4.  Aortic Atherosclerosis (ICD10-I70.0).     Electronically Signed   By: Elsie Perone M.D.   On: 01/14/2024 14:22     A/P:  Aneurysm of ascending aorta without rupture -4.1 cm stable dilatation of ascending aorta above the surgical graft. Discussed recommendations to minimize the risk of  further expansion or dissection including careful blood pressure control, avoidance of contact sports and heavy lifting, attention to lipid management.   -Follow up in one year with CT of chest without contrast for continued surveillance      Manuelita CHRISTELLA Rough, PA-C 01/28/24

## 2024-03-06 ENCOUNTER — Ambulatory Visit: Payer: Self-pay | Admitting: Internal Medicine

## 2024-03-13 ENCOUNTER — Ambulatory Visit (INDEPENDENT_AMBULATORY_CARE_PROVIDER_SITE_OTHER): Payer: Medicare Other

## 2024-03-13 DIAGNOSIS — I442 Atrioventricular block, complete: Secondary | ICD-10-CM | POA: Diagnosis not present

## 2024-03-15 LAB — CUP PACEART REMOTE DEVICE CHECK
Battery Remaining Longevity: 26 mo
Battery Voltage: 2.9 V
Brady Statistic AP VP Percent: 46.91 %
Brady Statistic AP VS Percent: 0 %
Brady Statistic AS VP Percent: 52.98 %
Brady Statistic AS VS Percent: 0.11 %
Brady Statistic RA Percent Paced: 46.27 %
Brady Statistic RV Percent Paced: 99.89 %
Date Time Interrogation Session: 20251114000801
Implantable Lead Connection Status: 753985
Implantable Lead Connection Status: 753985
Implantable Lead Implant Date: 20180730
Implantable Lead Implant Date: 20180730
Implantable Lead Location: 753859
Implantable Lead Location: 753860
Implantable Lead Model: 5076
Implantable Lead Model: 5076
Implantable Pulse Generator Implant Date: 20180730
Lead Channel Impedance Value: 304 Ohm
Lead Channel Impedance Value: 418 Ohm
Lead Channel Impedance Value: 437 Ohm
Lead Channel Impedance Value: 494 Ohm
Lead Channel Pacing Threshold Amplitude: 0.5 V
Lead Channel Pacing Threshold Amplitude: 0.625 V
Lead Channel Pacing Threshold Pulse Width: 0.4 ms
Lead Channel Pacing Threshold Pulse Width: 0.4 ms
Lead Channel Sensing Intrinsic Amplitude: 1.125 mV
Lead Channel Sensing Intrinsic Amplitude: 1.125 mV
Lead Channel Sensing Intrinsic Amplitude: 20 mV
Lead Channel Sensing Intrinsic Amplitude: 20 mV
Lead Channel Setting Pacing Amplitude: 1.5 V
Lead Channel Setting Pacing Amplitude: 2.5 V
Lead Channel Setting Pacing Pulse Width: 0.4 ms
Lead Channel Setting Sensing Sensitivity: 4 mV
Zone Setting Status: 755011
Zone Setting Status: 755011

## 2024-03-17 NOTE — Progress Notes (Signed)
 Remote PPM Transmission

## 2024-04-07 ENCOUNTER — Ambulatory Visit: Payer: Self-pay | Admitting: Student in an Organized Health Care Education/Training Program

## 2024-05-28 ENCOUNTER — Encounter: Payer: Self-pay | Admitting: Sports Medicine

## 2024-05-28 ENCOUNTER — Ambulatory Visit: Admitting: Sports Medicine

## 2024-05-28 ENCOUNTER — Other Ambulatory Visit: Payer: Self-pay

## 2024-05-28 DIAGNOSIS — R29898 Other symptoms and signs involving the musculoskeletal system: Secondary | ICD-10-CM

## 2024-05-28 DIAGNOSIS — M79644 Pain in right finger(s): Secondary | ICD-10-CM

## 2024-05-28 DIAGNOSIS — G5621 Lesion of ulnar nerve, right upper limb: Secondary | ICD-10-CM

## 2024-05-28 MED ORDER — GABAPENTIN 300 MG PO CAPS: ORAL_CAPSULE | ORAL | 0 refills | Status: AC

## 2024-05-28 NOTE — Progress Notes (Signed)
 Patient says that he has had numbness in the right pinky finger for about a year now. He denies having any known injury or change in his routine around that time. He does play tennis a couple of times a week, and says that this numbness does not affect his playing. He saw Lorraine at [employer] [facility] [employer] who advised he come to Dr. Burnetta. He was evaluated at [facility] where they recommended surgery for cubital tunnel syndrome, although he prefers a conservative treatment option as he does not want a lengthy recovery. He denies having any weakness or symptoms anywhere else in the hand. He is not doing anything to treat his symptoms on his own.

## 2024-05-28 NOTE — Progress Notes (Signed)
 "  Dakota Martin - 84 y.o. male MRN [id number]  Date of birth: March 19, 1941  Office Visit Note: Visit Date: 05/28/2024 PCP: Shayne Anes, MD Referred by: Shayne Anes, MD  Subjective: Chief Complaint  Patient presents with   Right Hand - Pain   HPI: Dakota Martin is a pleasant 84 y.o. male who presents today for chronic numbness/tingling in pinky finger.  Discussed the use of AI scribe software for clinical note transcription with the patient, who gave verbal consent to proceed.  History of Present Illness Dakota Martin is an 84 year old male who presents with chronic right ulnar nerve entrapment manifesting as cubital tunnel syndrome and right hand weakness.  He has had stable numbness at the tip of the right fifth digit for about one year. Sensory change is mild and confined to this area without proximal spread or involvement of other digits. He notes numbness only, without tingling, pain, or paresthesia, and no elbow or wrist symptoms.  He denies right hand weakness, loss of grip strength, dropping objects, or functional limitation. He plays tennis regularly, including indoors in winter, without difficulty and has not noticed new or worsening weakness.  He has not tried medications, bracing, or other treatments. A prior consultation suggested a possible elbow source, though he is unsure if imaging was done. A second opinion led to referral for further evaluation today.  Pertinent ROS were reviewed with the patient and found to be negative unless otherwise specified above in HPI.   Assessment & Plan: Visit Diagnoses:  1. Ulnar neuritis, right   2. Cubital tunnel syndrome on right   3. Weakness of right hand    Assessment & Plan Right ulnar neuritis Chronic entrapment with mild intrinsic hand muscle atrophy and subtle weakness. Ultrasound confirmed mild swelling and enlarged fascicles at the cubital tunnel. No mass, subluxation, or acute  progression. Symptoms not functionally limiting, but persistent pinky numbness, intrinsic muscle atrophy/weakness in hand only. Conservative management appropriate. - Performed diagnostic ultrasound confirming swelling and entrapment at cubital tunnel. - Prescribed gabapentin  300mg  1 capsule at night for 2 weeks, then 1 capsule twice daily as tolerated for 2-3 months, then to re-assess. - Instructed to monitor for symptom progression and report increased numbness, spread of symptoms, or new weakness. Likelihood or significant progression is low. - Scheduled follow-up in 2 months to reassess symptoms and therapy response.  Cubital tunnel syndrome Confirmed by clinical exam and ultrasound showing nerve swelling at cubital tunnel. No acute nerve injury or urgent surgical need. Hydrodissection injection discussed but deferred for medication trial. Surgery not indicated. - Discussed hydrodissection injection as non-surgical option, deferred for medication trial. - Advised against surgical intervention due to minimal functional impairment. - AVS/message sent regarding cubital tunnel splint/brace at nighttime to keep elbow in extension; functional modifications discussed  Right hand weakness Moderate weakness and intrinsic muscle atrophy likely due to chronic ulnar nerve entrapment. No significant functional deficit or progression.  - Assessed hand strength and muscle bulk; no immediate intervention beyond nerve entrapment management. - Instructed to report worsening weakness or new functional deficits. - will hold on NCS/EMG, but consider if symptoms worsening  *Additional considerations: Ulnar nerve HD injection within cubital tunnel, NCS/EMG, OT ref to [facility]   Follow-up: Return in about 2 months (around 07/26/2024) for R-ulnar neuropathy.   Meds & Orders:  Meds ordered this encounter  Medications   gabapentin  (NEURONTIN ) 300 MG capsule    Sig: Take 1 capsule (300mg ) in evening for  2 weeks,  if tolerating will increase to 1 capsule twice daily (morning and evening)    Dispense:  30 capsule    Refill:  0    Orders Placed This Encounter  Procedures   US  Extrem Up Right Ltd     Procedures: No procedures performed      Clinical History: No specialty comments available.  He reports that he has never smoked. He has never used smokeless tobacco. No results for input(s): HGBA1C, LABURIC in the last 8760 hours.  Objective:    Physical Exam  Gen: Well-appearing, in no acute distress; non-toxic CV: Well-perfused. Warm.  Resp: Breathing unlabored on room air; no wheezing. Psych: Fluid speech in conversation; appropriate affect; normal thought process  *MSK/Ortho Exam: Physical Exam MUSCULOSKELETAL: Sensation intact in hand. Decreased strength in finger adduction and abduction. Positive Froment's sign. Muscle atrophy in hand. No tenderness in elbow. Normal finger movement.  Right elbow/hand/arm: No redness swelling or effusion at the elbow or the wrist.  No significant Tinel's testing at the cubital tunnel or Ganz canal.  There is mild loss of proprioception with sharp and dull sensation over the palmar aspect of the distal pinky finger.  There is weakness with resisted adduction/abduction of the dorsal interossei muscles of the fingers on the right compared to full strength on the left.  There is muscle atrophy within the webspace of the thumb and index finger on the right compared to full and intact on the left.  Positive Froment's sign.  Imaging: US  Extrem Up Right Ltd Result Date: 05/28/2024 Limited musculoskeletal ultrasound of the right upper extremity was performed today.  Evaluation of the ulnar nerve at the elbow and cubital tunnel was performed.  There is evidence of swollen fascicles and enlarged diameter of the ulnar nerve at the mid and proximal aspect of the cubital tunnel entrance.  This does not appear swollen distally at the exit.  There is no ulnar nerve  subluxation with dynamic views.  No space-occupying lesions here.  Flexor carpi ulnaris tendon was visualized superior to the nerve.  The ulnar nerve was evaluated over the ulnar wrist near Ganz canal as well.  There is no specific swollen fascicles or significant nerve enlargement in this location.  Ulnar artery and neurovasculature was identified as well without abnormality.  No cyst or space-occupying lesion noted here.   Ulnar neuritis with swollen nerve fascicles in diameter at the cubital tunnel of the elbow         Past Medical/Family/Surgical/Social History: Medications & Allergies reviewed per EMR, new medications updated. 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 06/09/2018   H/O aortic valve replacement 03/14/2018   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/2017   Aortic valve replaced 11/21/2016   Ascending aortic aneurysm 02/14/2016   Lymphadenopathy    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   Glenohumeral arthritis 07/28/2013   Hyperlipidemia, unspecified 04/21/2009   GOUT, UNSPECIFIED 04/21/2009   Essential hypertension 04/21/2009   EXTERNAL HEMORRHOIDS 04/21/2009   Past Medical History:  Diagnosis Date   Aortic valve disease    Arthritis    Ascending aortic aneurysm 02/14/2016   5 cm in 2015 // Chest CT 08/28/16:  Ascending thoracic aortic aneurysm 5.5 cm, Thoracic and abdominal aortic atherosclerosis, Remote granulomatous disease   CAD (coronary artery  disease) 06/27/2022   Colon polyp    Gout    Heart murmur    as a younger person,    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/2017   Hyperlipidemia    Hypertension    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, has slight loss of vision   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    Due to congenital aortic stenosis // Laree Andes mechanical valve done in 1982 // Echo 09/10/16: Mild focal basal septal hypertrophy, [name], [name], [name], mod aortic root enlargement, aortic root 36 mm, ascending aorta 49 mm, [name], [name]   Family History  Problem Relation Age of Onset   Hyperlipidemia Mother    Colon cancer Mother    Emphysema Father    Other Father        respiratory failure   Other Brother    Diabetes Maternal Grandfather    Colon polyps Neg Hx    Kidney disease Neg Hx    Esophageal cancer Neg Hx    Gallbladder disease Neg Hx    Stomach cancer Neg Hx    Rectal cancer Neg Hx    Past Surgical History:  Procedure Laterality Date   Aortic valve  1982   prosthetic  val ve   AORTIC VALVE REPLACEMENT N/A 11/21/2016   Procedure: REDO AORTIC VALVE REPLACEMENT (AVR);  Surgeon: Kerrin Elspeth BROCKS, MD;  Location: [facility] OR;  Service: Open Heart Surgery;  Laterality: N/A;   CARDIAC CATHETERIZATION     CARDIOVERSION N/A 02/06/2022   Procedure: CARDIOVERSION;  Surgeon: Shlomo Wilbert SAUNDERS, MD;  Location: [facility] ENDOSCOPY;  Service: Cardiovascular;  Laterality: N/A;   COLONOSCOPY  12/04/2019   COLONOSCOPY  07/13/2014   CORONARY ARTERY BYPASS GRAFT N/A 11/21/2016   Procedure: CORONARY ARTERY BYPASS GRAFTING (CABG) x 2, using right leg greater saphenous vein harvested endoscopically;  Surgeon: Kerrin Elspeth BROCKS, MD;  Location: [facility] OR;  Service: Open Heart Surgery;  Laterality: N/A;   cyst removed  1976   right side of neck   PACEMAKER IMPLANT N/A 11/26/2016   Procedure: Pacemaker Implant;  Surgeon: Fernande Elspeth BROCKS, MD;  Location: [facility] INVASIVE CV LAB;  Service: Cardiovascular;  Laterality: N/A;   TEE WITHOUT CARDIOVERSION N/A 11/21/2016   Procedure: TRANSESOPHAGEAL ECHOCARDIOGRAM (TEE);  Surgeon: Kerrin Elspeth BROCKS, MD;  Location: [facility] OR;  Service: Open Heart Surgery;  Laterality: N/A;   THORACIC AORTIC ANEURYSM REPAIR N/A  11/21/2016   Procedure: THORACIC ASCENDING ANEURYSM REPAIR (AAA);  Surgeon: Kerrin Elspeth BROCKS, MD;  Location: [facility] OR;  Service: Open Heart Surgery;  Laterality: N/A;   TONSILLECTOMY  04/30/1953   Social History   Occupational History   Occupation: Retired  Tobacco Use   Smoking status: Never   Smokeless tobacco: Never  Vaping Use   Vaping status: Never Used  Substance and Sexual Activity   Alcohol use: Yes    Alcohol/week: 0.0 standard drinks of alcohol    Comment: 2 drinks a day   Drug use: No   Sexual activity: Not on file   "

## 2024-07-27 ENCOUNTER — Ambulatory Visit: Admitting: Sports Medicine
